# Patient Record
Sex: Male | Born: 1975 | Race: Black or African American | Hispanic: No | Marital: Single | Smoking: Current every day smoker
Health system: Southern US, Community
[De-identification: ages and names within clinical notes are randomized; demographics above are authoritative.]

## PROBLEM LIST (undated history)

## (undated) DIAGNOSIS — J45909 Unspecified asthma, uncomplicated: Secondary | ICD-10-CM

## (undated) DIAGNOSIS — K5090 Crohn's disease, unspecified, without complications: Secondary | ICD-10-CM

## (undated) DIAGNOSIS — T8143XA Infection following a procedure, organ and space surgical site, initial encounter: Secondary | ICD-10-CM

## (undated) DIAGNOSIS — K56609 Unspecified intestinal obstruction, unspecified as to partial versus complete obstruction: Secondary | ICD-10-CM

## (undated) DIAGNOSIS — K50012 Crohn's disease of small intestine with intestinal obstruction: Secondary | ICD-10-CM

## (undated) HISTORY — PX: MASS EXCISION: SHX2000

## (undated) HISTORY — DX: Crohn's disease of small intestine with intestinal obstruction: K50.012

## (undated) HISTORY — DX: Infection following a procedure, organ and space surgical site, initial encounter: T81.43XA

---

## 2018-05-03 ENCOUNTER — Encounter (HOSPITAL_COMMUNITY): Payer: Self-pay | Admitting: Emergency Medicine

## 2018-05-03 ENCOUNTER — Emergency Department (HOSPITAL_COMMUNITY): Payer: Self-pay

## 2018-05-03 ENCOUNTER — Inpatient Hospital Stay (HOSPITAL_COMMUNITY)
Admission: EM | Admit: 2018-05-03 | Discharge: 2018-05-06 | DRG: 387 | Disposition: A | Discharge status: Home / Self Care | Payer: Self-pay | Attending: Internal Medicine | Admitting: Internal Medicine

## 2018-05-03 ENCOUNTER — Other Ambulatory Visit: Payer: Self-pay

## 2018-05-03 DIAGNOSIS — R700 Elevated erythrocyte sedimentation rate: Secondary | ICD-10-CM | POA: Diagnosis present

## 2018-05-03 DIAGNOSIS — Z7951 Long term (current) use of inhaled steroids: Secondary | ICD-10-CM

## 2018-05-03 DIAGNOSIS — R112 Nausea with vomiting, unspecified: Secondary | ICD-10-CM

## 2018-05-03 DIAGNOSIS — Z09 Encounter for follow-up examination after completed treatment for conditions other than malignant neoplasm: Secondary | ICD-10-CM

## 2018-05-03 DIAGNOSIS — K50012 Crohn's disease of small intestine with intestinal obstruction: Principal | ICD-10-CM | POA: Diagnosis present

## 2018-05-03 DIAGNOSIS — F17210 Nicotine dependence, cigarettes, uncomplicated: Secondary | ICD-10-CM | POA: Diagnosis present

## 2018-05-03 DIAGNOSIS — R7982 Elevated C-reactive protein (CRP): Secondary | ICD-10-CM | POA: Diagnosis present

## 2018-05-03 DIAGNOSIS — J45909 Unspecified asthma, uncomplicated: Secondary | ICD-10-CM | POA: Diagnosis present

## 2018-05-03 DIAGNOSIS — K56609 Unspecified intestinal obstruction, unspecified as to partial versus complete obstruction: Secondary | ICD-10-CM | POA: Diagnosis present

## 2018-05-03 HISTORY — DX: Unspecified intestinal obstruction, unspecified as to partial versus complete obstruction: K56.609

## 2018-05-03 HISTORY — DX: Unspecified asthma, uncomplicated: J45.909

## 2018-05-03 LAB — URINALYSIS, ROUTINE W REFLEX MICROSCOPIC
Bilirubin Urine: NEGATIVE
GLUCOSE, UA: NEGATIVE mg/dL
HGB URINE DIPSTICK: NEGATIVE
KETONES UR: NEGATIVE mg/dL
LEUKOCYTES UA: NEGATIVE
Nitrite: NEGATIVE
PROTEIN: NEGATIVE mg/dL
Specific Gravity, Urine: 1.043 — ABNORMAL HIGH (ref 1.005–1.030)
pH: 5 (ref 5.0–8.0)

## 2018-05-03 LAB — CBC WITH DIFFERENTIAL/PLATELET
ABS IMMATURE GRANULOCYTES: 0 10*3/uL (ref 0.0–0.1)
BASOS ABS: 0.1 10*3/uL (ref 0.0–0.1)
BASOS PCT: 1 %
Eosinophils Absolute: 0.3 10*3/uL (ref 0.0–0.7)
Eosinophils Relative: 3 %
HCT: 44.6 % (ref 39.0–52.0)
Hemoglobin: 14.1 g/dL (ref 13.0–17.0)
IMMATURE GRANULOCYTES: 0 %
Lymphocytes Relative: 8 %
Lymphs Abs: 0.8 10*3/uL (ref 0.7–4.0)
MCH: 28.5 pg (ref 26.0–34.0)
MCHC: 31.6 g/dL (ref 30.0–36.0)
MCV: 90.1 fL (ref 78.0–100.0)
Monocytes Absolute: 1.3 10*3/uL — ABNORMAL HIGH (ref 0.1–1.0)
Monocytes Relative: 14 %
NEUTROS ABS: 7.1 10*3/uL (ref 1.7–7.7)
NEUTROS PCT: 74 %
PLATELETS: 257 10*3/uL (ref 150–400)
RBC: 4.95 MIL/uL (ref 4.22–5.81)
RDW: 16 % — ABNORMAL HIGH (ref 11.5–15.5)
WBC: 9.6 10*3/uL (ref 4.0–10.5)

## 2018-05-03 LAB — COMPREHENSIVE METABOLIC PANEL
ALBUMIN: 3.1 g/dL — AB (ref 3.5–5.0)
ALT: 13 U/L (ref 0–44)
AST: 14 U/L — AB (ref 15–41)
Alkaline Phosphatase: 51 U/L (ref 38–126)
Anion gap: 8 (ref 5–15)
BUN: 8 mg/dL (ref 6–20)
CHLORIDE: 103 mmol/L (ref 98–111)
CO2: 27 mmol/L (ref 22–32)
CREATININE: 1 mg/dL (ref 0.61–1.24)
Calcium: 9.3 mg/dL (ref 8.9–10.3)
GFR calc Af Amer: >60 mL/min (ref >60)
GFR calc non Af Amer: >60 mL/min (ref >60)
Glucose, Bld: 102 mg/dL — ABNORMAL HIGH (ref 70–99)
POTASSIUM: 4.2 mmol/L (ref 3.5–5.1)
SODIUM: 138 mmol/L (ref 135–145)
Total Bilirubin: 0.7 mg/dL (ref 0.3–1.2)
Total Protein: 6.7 g/dL (ref 6.5–8.1)

## 2018-05-03 LAB — HEMOGLOBIN A1C
Hgb A1c MFr Bld: 6 % — ABNORMAL HIGH (ref 4.8–5.6)
MEAN PLASMA GLUCOSE: 125.5 mg/dL

## 2018-05-03 LAB — SEDIMENTATION RATE: SED RATE: 46 mm/h — AB (ref 0–16)

## 2018-05-03 LAB — LIPASE, BLOOD: Lipase: 32 U/L (ref 11–51)

## 2018-05-03 LAB — MAGNESIUM: Magnesium: 1.9 mg/dL (ref 1.7–2.4)

## 2018-05-03 LAB — TSH: TSH: 0.3 u[IU]/mL — ABNORMAL LOW (ref 0.350–4.500)

## 2018-05-03 MED ORDER — ONDANSETRON HCL 4 MG/2ML IJ SOLN
4.0000 mg | Freq: Once | INTRAMUSCULAR | Status: AC
  Administered 2018-05-03: 4 mg via INTRAVENOUS
  Filled 2018-05-03: qty 2

## 2018-05-03 MED ORDER — FENTANYL CITRATE (PF) 100 MCG/2ML IJ SOLN
25.0000 ug | INTRAMUSCULAR | Status: DC | PRN
  Administered 2018-05-03 – 2018-05-04 (×4): 25 ug via INTRAVENOUS
  Filled 2018-05-03 (×4): qty 2

## 2018-05-03 MED ORDER — IOPAMIDOL (ISOVUE-300) INJECTION 61%
100.0000 mL | Freq: Once | INTRAVENOUS | Status: AC | PRN
  Administered 2018-05-03: 100 mL via INTRAVENOUS

## 2018-05-03 MED ORDER — ENOXAPARIN SODIUM 40 MG/0.4ML ~~LOC~~ SOLN
40.0000 mg | Freq: Every day | SUBCUTANEOUS | Status: DC
  Administered 2018-05-03: 40 mg via SUBCUTANEOUS
  Filled 2018-05-03: qty 0.4

## 2018-05-03 MED ORDER — LEVALBUTEROL HCL 1.25 MG/0.5ML IN NEBU: 1.2500 mg | INHALATION_SOLUTION | Freq: Four times a day (QID) | RESPIRATORY_TRACT | Status: DC | PRN

## 2018-05-03 MED ORDER — ONDANSETRON HCL 4 MG/2ML IJ SOLN: 4.0000 mg | Freq: Four times a day (QID) | INTRAMUSCULAR | Status: DC | PRN

## 2018-05-03 MED ORDER — ALBUTEROL SULFATE (2.5 MG/3ML) 0.083% IN NEBU
3.0000 mL | INHALATION_SOLUTION | Freq: Four times a day (QID) | RESPIRATORY_TRACT | Status: DC | PRN
  Administered 2018-05-04: 3 mL via RESPIRATORY_TRACT
  Filled 2018-05-03: qty 3

## 2018-05-03 MED ORDER — HEPARIN SODIUM (PORCINE) 5000 UNIT/ML IJ SOLN
5000.0000 [IU] | Freq: Three times a day (TID) | INTRAMUSCULAR | Status: DC
  Administered 2018-05-04 – 2018-05-05 (×3): 5000 [IU] via SUBCUTANEOUS
  Filled 2018-05-03 (×5): qty 1

## 2018-05-03 MED ORDER — ACETAMINOPHEN 325 MG PO TABS: 650.0000 mg | ORAL_TABLET | Freq: Four times a day (QID) | ORAL | Status: DC | PRN

## 2018-05-03 MED ORDER — METHYLPREDNISOLONE SODIUM SUCC 40 MG IJ SOLR
40.0000 mg | Freq: Every day | INTRAMUSCULAR | Status: DC
  Administered 2018-05-03 – 2018-05-06 (×4): 40 mg via INTRAVENOUS
  Filled 2018-05-03 (×4): qty 1

## 2018-05-03 MED ORDER — MORPHINE SULFATE (PF) 4 MG/ML IV SOLN
4.0000 mg | Freq: Once | INTRAVENOUS | Status: AC
  Administered 2018-05-03: 4 mg via INTRAVENOUS
  Filled 2018-05-03: qty 1

## 2018-05-03 MED ORDER — METRONIDAZOLE IN NACL 5-0.79 MG/ML-% IV SOLN
500.0000 mg | Freq: Three times a day (TID) | INTRAVENOUS | Status: DC
  Administered 2018-05-03 – 2018-05-06 (×11): 500 mg via INTRAVENOUS
  Filled 2018-05-03 (×12): qty 100

## 2018-05-03 MED ORDER — SODIUM CHLORIDE 0.9 % IV SOLN
Freq: Once | INTRAVENOUS | Status: AC
  Administered 2018-05-03: 07:00:00 via INTRAVENOUS

## 2018-05-03 MED ORDER — SODIUM CHLORIDE 0.9 % IV SOLN
INTRAVENOUS | Status: DC
  Administered 2018-05-04 – 2018-05-05 (×3): via INTRAVENOUS

## 2018-05-03 MED ORDER — ONDANSETRON HCL 4 MG PO TABS: 4.0000 mg | ORAL_TABLET | Freq: Four times a day (QID) | ORAL | Status: DC | PRN

## 2018-05-03 MED ORDER — POTASSIUM CHLORIDE IN NACL 20-0.9 MEQ/L-% IV SOLN: INTRAVENOUS | Status: DC

## 2018-05-03 MED ORDER — CIPROFLOXACIN IN D5W 400 MG/200ML IV SOLN
400.0000 mg | Freq: Two times a day (BID) | INTRAVENOUS | Status: DC
  Administered 2018-05-03 – 2018-05-06 (×7): 400 mg via INTRAVENOUS
  Filled 2018-05-03 (×8): qty 200

## 2018-05-03 MED ORDER — IOPAMIDOL (ISOVUE-300) INJECTION 61%
INTRAVENOUS | Status: AC
  Filled 2018-05-03: qty 100

## 2018-05-03 MED ORDER — ACETAMINOPHEN 650 MG RE SUPP: 650.0000 mg | Freq: Four times a day (QID) | RECTAL | Status: DC | PRN

## 2018-05-03 MED ORDER — SODIUM CHLORIDE 0.9 % IV SOLN: INTRAVENOUS | Status: DC

## 2018-05-03 NOTE — ED Triage Notes (Signed)
Patient arrived with EMS from home reports upper adominal pain with constipation for 3 days , emesis x2 , denies fever or diarrhea , admitted 2 1/2 weeks ago at [facility] for SBO .

## 2018-05-03 NOTE — Consult Note (Addendum)
[facility] Consult  Referring Provider: Reyne Dumas, MD Primary Care Physician:  Patient, No Pcp Per Primary Gastroenterologist: Althia Forts  Reason for Consultation:  Small bowel obstruction, possible Crohn's disease  HPI: Eric Castillo is a 42 y.o. male presented to the ER with complaints of abdominal pain. 3 weeks ago when he was in [place], he developed nausea along with upper abdominal pain and was admitted for a week with small bowel obstruction. Patient was being planned for a colonoscopy but was not able to tolerate the prep. He was subsequently discharged on steroids along with ciprofloxacin and Flagyl. He did okay for about a week, however since the last 5 days has recurrence of symptoms, which he describes as upper abdominal pain, bloating, belching. Patient has not had a bowel movement for the last 4-5 days, denies passage of flatus. His mother had Crohn's disease, and passed away 3 years ago, needed surgical intervention as well as long-term management for Crohn's disease. His father had colon cancer in his 28s. The patient has never had a colonoscopy in the past. Denies rectal bleeding, black stools, loss of appetite or unintentional weight loss recently. He is a smoker and smokes about 4 cigarettes a day since the age of 40. He denies oral ulcers, joint pain or skin rashes. Patient denies acid reflux, heartburn, difficulty swallowing or pain on swallowing.   Past Medical History:  Diagnosis Date  . Asthma   . SBO (small bowel obstruction) ([place])     History reviewed. No pertinent surgical history.  Prior to Admission medications   Medication Sig Start Date End Date Taking? Authorizing Provider  albuterol (PROVENTIL HFA;VENTOLIN HFA) 108 (90 Base) MCG/ACT inhaler Inhale 1-2 puffs into the lungs every 6 (six) hours as needed for wheezing or shortness of breath.   Yes [provider]    Current Facility-Administered Medications  Medication  Dose Route Frequency Provider Last Rate Last Dose  . ciprofloxacin (CIPRO) IVPB 400 mg  400 mg Intravenous Q12H Reyne Dumas, MD 200 mL/hr at 05/03/18 1041 400 mg at 05/03/18 1041  . enoxaparin (LOVENOX) injection 40 mg  40 mg Subcutaneous Daily Reyne Dumas, MD   40 mg at 05/03/18 1331  . fentaNYL (SUBLIMAZE) injection 25-50 mcg  25-50 mcg Intravenous Q2H PRN Opyd, Ilene Qua, MD   25 mcg at 05/03/18 1431  . levalbuterol (XOPENEX) nebulizer solution 1.25 mg  1.25 mg Nebulization Q6H PRN Reyne Dumas, MD      . methylPREDNISolone sodium succinate (SOLU-MEDROL) 40 mg/mL injection 40 mg  40 mg Intravenous Daily Reyne Dumas, MD   40 mg at 05/03/18 0933  . metroNIDAZOLE (FLAGYL) IVPB 500 mg  500 mg Intravenous Q8H Abrol, Ascencion Dike, MD 100 mL/hr at 05/03/18 0935 500 mg at 05/03/18 0935  . ondansetron (ZOFRAN) injection 4 mg  4 mg Intravenous Q6H PRN Opyd, Ilene Qua, MD        Allergies as of 05/03/2018  . (No Known Allergies)    History reviewed. No pertinent family history.  Social History   Socioeconomic History  . Marital status: Single    Spouse name: Not on file  . Number of children: Not on file  . Years of education: Not on file  . Highest education level: Not on file  Occupational History  . Not on file  Social Needs  . Financial resource strain: Not on file  . Food insecurity:    Worry: Not on file    Inability: Not on file  . Transportation needs:  Medical: Not on file    Non-medical: Not on file  Tobacco Use  . Smoking status: Current Every Day Smoker  . Smokeless tobacco: Never Used  Substance and Sexual Activity  . Alcohol use: Yes  . Drug use: Yes    Types: Marijuana  . Sexual activity: Not on file  Lifestyle  . Physical activity:    Days per week: Not on file    Minutes per session: Not on file  . Stress: Not on file  Relationships  . Social connections:    Talks on phone: Not on file    Gets together: Not on file    Attends religious service: Not on  file    Active member of club or organization: Not on file    Attends meetings of clubs or organizations: Not on file    Relationship status: Not on file  . Intimate partner violence:    Fear of current or ex partner: Not on file    Emotionally abused: Not on file    Physically abused: Not on file    Forced sexual activity: Not on file  Other Topics Concern  . Not on file  Social History Narrative  . Not on file    Review of Systems: Positive for: GI: Described in detail in HPI.    Gen: Denies any fever, chills, rigors, night sweats, anorexia, fatigue, weakness, malaise, involuntary weight loss, and sleep disorder CV: Denies chest pain, angina, palpitations, syncope, orthopnea, PND, peripheral edema, and claudication. Resp: Denies dyspnea, cough, sputum, wheezing, coughing up blood. GU : Denies urinary burning, blood in urine, urinary frequency, urinary hesitancy, nocturnal urination, and urinary incontinence. MS: Denies joint pain or swelling.  Denies muscle weakness, cramps, atrophy.  Derm: Denies rash, itching, oral ulcerations, hives, unhealing ulcers.  Psych: Denies depression, anxiety, memory loss, suicidal ideation, hallucinations,  and confusion. Heme: Denies bruising, bleeding, and enlarged lymph nodes. Neuro:  Denies any headaches, dizziness, paresthesias. Endo:  Denies any problems with DM, thyroid, adrenal function.  Physical Exam: Vital signs in last 24 hours: Temp:  [97.6 F (36.4 C)-98.5 F (36.9 C)] 98.5 F (36.9 C) (09/03 1356) Pulse Rate:  [71-89] 79 (09/03 1356) Resp:  [10-19] 16 (09/03 1356) BP: (103-153)/(78-100) 141/83 (09/03 1356) SpO2:  [97 %-100 %] 99 % (09/03 1356) Weight:  [62.4 kg] 62.4 kg (09/03 1330) Last BM Date: 04/29/18  General:   Alert,  Well-developed, thinly built, pleasant and cooperative in NAD NG tube connected to suction Head:  Normocephalic and atraumatic. Eyes:  Sclera clear, no icterus.   Conjunctiva pink. Ears:  Normal auditory  acuity. Nose:  No deformity, discharge,  or lesions. Mouth:  No deformity or lesions.  Oropharynx pink & moist. Neck:  Supple; no masses or thyromegaly. Lungs:  Clear throughout to auscultation.   No wheezes, crackles, or rhonchi. No acute distress. Heart:  Regular rate and rhythm; no murmurs, clicks, rubs,  or gallops. Extremities:  Without clubbing or edema. Neurologic:  Alert and  oriented x4;  grossly normal neurologically. Skin:  Intact without significant lesions or rashes. Psych:  Alert and cooperative. Normal mood and affect. Abdomen:  Soft,mild right lower quadrant tenderness and nondistended. No masses, hepatosplenomegaly or hernias noted. Hypoactive bowel sounds, without guarding, and without rebound.         Lab Results: Recent Labs    05/03/18 0425  WBC 9.6  HGB 14.1  HCT 44.6  PLT 257   BMET Recent Labs    05/03/18 0425  NA  138  K 4.2  CL 103  CO2 27  GLUCOSE 102*  BUN 8  CREATININE 1.00  CALCIUM 9.3   LFT Recent Labs    05/03/18 0425  PROT 6.7  ALBUMIN 3.1*  AST 14*  ALT 13  ALKPHOS 51  BILITOT 0.7   PT/INR No results for input(s): LABPROT, INR in the last 72 hours.  Studies/Results: Ct Abdomen Pelvis W Contrast  Result Date: 05/03/2018 CLINICAL DATA:  Upper abdominal pain and constipation for 3 days. Vomiting. Recent hospital admission for small-bowel obstruction. EXAM: CT ABDOMEN AND PELVIS WITH CONTRAST TECHNIQUE: Multidetector CT imaging of the abdomen and pelvis was performed using the standard protocol following bolus administration of intravenous contrast. CONTRAST:  100 mL ISOVUE-300 IOPAMIDOL (ISOVUE-300) INJECTION 61% COMPARISON:  None. FINDINGS: LOWER CHEST: Mild bronchial wall thickening and tiny tree-in-bud infiltrates most compatible with respiratory bronchiolitis. Lung bases are clear. Included heart size is normal. No pericardial effusion. HEPATOBILIARY: 19 mm homogeneously hypodense benign-appearing cyst RIGHT lobe of the liver.  Subcentimeter probable flash filling hemangioma segment 4 of the liver. PANCREAS: Normal. SPLEEN: Normal. ADRENALS/URINARY TRACT: Kidneys are orthotopic, demonstrating symmetric enhancement. Mild cortical thinning lower pole RIGHT kidney. No nephrolithiasis, hydronephrosis or solid renal masses. The unopacified ureters are normal in course and caliber. Delayed imaging through the kidneys demonstrates symmetric prompt contrast excretion within the proximal urinary collecting system. Urinary bladder is partially distended and unremarkable. Normal adrenal glands. STOMACH/BOWEL: Small bowel dilated to 4.2 cm with air-fluid levels, transition point at terminal ileum associated with bowel wall thickening and inflammatory changes. Decompressed colon. The appendix is not discretely identified. VASCULAR/LYMPHATIC: Aortoiliac vessels are normal in course and caliber. No lymphadenopathy by CT size criteria. REPRODUCTIVE: Normal. OTHER: Small amount of free fluid in the pelvis. No intraperitoneal free air or focal fluid collections. MUSCULOSKELETAL: Nonacute. IMPRESSION: 1. Small-bowel obstruction with transition point at inflamed terminal ileum concerning for inflammatory bowel disease. 2. Mild suspected respiratory bronchiolitis with bronchitic changes. Electronically Signed   By: Elon Alas M.D.   On: 05/03/2018 06:26    Impression: 1.Small bowel obstruction with transition point at the inflamed terminal ileum concerning for inflammatory bowel disease,possible Crohn's Small bowel dilated to 4.2 cm with air fluid levels and decompressed colon Hemoglobin 14.1, platelets 257,total protein 6.7, albumin low at 3.1  2.19 mm right lower fatty liver cyst, subcentimeter hemangioma in segment 4 of liver  3. Mild respiratory bronchiolitis with bronchitis changes  Plan: Agree with NG tube placement and connection to low intermittent suction Patient has already been started on IV Solu-Medrol 40 mg daily Empirical  treatment with ciprofloxacin 400 mg IV every 12 hours and Flagyl 500 mg every 8 hours Normal saline at 100 mL per hour  Will send labs for ESR, CRP,ASCA and ANCA antibodies. Will send labs for HBsAG and TB gold mitogen testing, in anticipation of starting biologics/immunosuppresants in the future. Colonoscopy with terminal ileal biopsies at one point, fully in the next few weeks after resolution of small bowel obstruction, likely as an outpatient. Discussed about possible surgical intervention if small bowel obstruction does not resolve with conservative management. Discussed about the importance of smoking cessation( especially if he has Crohn's disease)   LOS: 0 days   Ronnette Juniper, MD  05/03/2018, 2:38 PM  Pager [phone] If no answer or after 5 PM call [phone]

## 2018-05-03 NOTE — H&P (Addendum)
[facility] History and Physical  Eric Castillo XBL:[id number] DOB: [birth year] DOA: 05/03/2018  Referring physician:   PCP: Eric Castillo, No Pcp Per   Chief Complaint:  constipation  HPI:   42 year old male with a previous history of small bowel obstruction presents to the ER with abdominal pain nausea vomiting, constipation 3 days. Eric Castillo was admitted  1.5 weeks ago at [facility] , [place] , for small bowel obstruction.Eric Castillo was discharged with a diagnosis of questionable Crohn's and was discharged on Cipro and Flagyl. Eric Castillo reports 2-3 days of worsening upper abdominal pain, 8 on 10 in intensity. No bowel movement since Friday.no flatus. Multiple episodes of nonbilious nonbloody emesis.  Symptoms similar to episode of week and a half ago. He had a CT abdomen pelvis but no endoscopic evaluation. Crohn's disease was suspected. Eric Castillo  Lives in [place] and was asked to follow-up with gastroenterology [facility] ED course BP 138/88 (BP Location: Right Arm)   Pulse 89   Temp 97.6 F (36.4 C) (Oral)   Resp 16   SpO2 97%  Creatinine 1.0, potassium 4.2, white count 9.6, CT abdomen pelvis shows small bowel obstruction with transition point concerning for inflammatory bowel disease Eric Castillo also suspected to have respiratory bronchiolitis  Eric Castillo admitted for suspected Crohn's disease exacerbation   Review of Systems: negative for the following  Constitutional: Denies fever, chills, diaphoresis, appetite change and fatigue.  HEENT: Denies photophobia, eye pain, redness, hearing loss, ear pain, congestion, sore throat, rhinorrhea, sneezing, mouth sores, trouble swallowing, neck pain, neck stiffness and tinnitus.  Respiratory: Denies SOB, DOE, cough, chest tightness, and wheezing.  Cardiovascular: Denies chest pain, palpitations and leg swelling.  Gastrointestinal: Positive for abdominal pain, constipation, nausea and vomiting.   Genitourinary: Denies dysuria, urgency, frequency, hematuria,  flank pain and difficulty urinating.  Musculoskeletal: Denies myalgias, back pain, joint swelling, arthralgias and gait problem.  Skin: Denies pallor, rash and wound.  Neurological: Denies dizziness, seizures, syncope, weakness, light-headedness, numbness and headaches.  Hematological: Denies adenopathy. Easy bruising, personal or family bleeding history  Psychiatric/Behavioral: Denies suicidal ideation, mood changes, confusion, nervousness, sleep disturbance and agitation       Past Medical History:  Diagnosis Date  . Asthma   . SBO (small bowel obstruction) ([place])      History reviewed. No pertinent surgical history.    Social History:  reports that he has been smoking. He has never used smokeless tobacco. He reports that he drinks alcohol. He reports that he has current or past drug history. Drug: Marijuana.    No Known Allergies      FAMILY HISTORY  When questioned  Directly-Eric Castillo reports  No family history of HTN, CVA ,DIABETES, TB, Cancer CAD, Bleeding Disorders, Sickle Cell, diabetes, anemia, asthma,   Prior to Admission medications   Medication Sig Start Date End Date Taking? Authorizing Provider  albuterol (PROVENTIL HFA;VENTOLIN HFA) 108 (90 Base) MCG/ACT inhaler Inhale 1-2 puffs into the lungs every 6 (six) hours as needed for wheezing or shortness of breath.   Yes [provider]     Physical Exam: Vitals:   05/03/18 0700 05/03/18 0715 05/03/18 0730 05/03/18 0806  BP: (!) 153/100 (!) 150/98 (!) 147/97 (!) 148/97  Pulse: 72 82 76 71  Resp: 13 14 18 16   Temp:    98.5 F (36.9 C)  TempSrc:    Oral  SpO2: 100% 100% 100% 98%        Vitals:   05/03/18 0700 05/03/18 0715 05/03/18 0730 05/03/18 0806  BP: (!) 153/100 (!) 150/98 Marland Kitchen)  147/97 (!) 148/97  Pulse: 72 82 76 71  Resp: 13 14 18 16   Temp:    98.5 F (36.9 C)  TempSrc:    Oral  SpO2: 100% 100% 100% 98%   Constitutional: NAD, calm, comfortable, NG tube in place Eyes: PERRL, lids and  conjunctivae normal ENMT: Mucous membranes are moist. Posterior pharynx clear of any exudate or lesions.Normal dentition.  Neck: normal, supple, no masses, no thyromegaly Respiratory: clear to auscultation bilaterally, no wheezing, no crackles. Normal respiratory effort. No accessory muscle use.  Cardiovascular: Regular rate and rhythm, no murmurs / rubs / gallops. No extremity edema. 2+ pedal pulses. No carotid bruits.  Abdomen:diffuse tenderness,hypoactive bowel sounds, no masses palpated. No hepatosplenomegaly. Bowel sounds absent Musculoskeletal: no clubbing / cyanosis. No joint deformity upper and lower extremities. Good ROM, no contractures. Normal muscle tone.  Skin: no rashes, lesions, ulcers. No induration Neurologic: CN 2-12 grossly intact. Sensation intact, DTR normal. Strength 5/5 in all 4.  Psychiatric: Normal judgment and insight. Alert and oriented x 3. Normal mood.     Labs on Admission: I have personally reviewed following labs and imaging studies  CBC: Recent Labs  Lab 05/03/18 0425  WBC 9.6  NEUTROABS 7.1  HGB 14.1  HCT 44.6  MCV 90.1  PLT 353    Basic Metabolic Panel: Recent Labs  Lab 05/03/18 0425  NA 138  K 4.2  CL 103  CO2 27  GLUCOSE 102*  BUN 8  CREATININE 1.00  CALCIUM 9.3    GFR: CrCl cannot be calculated (Unknown ideal weight.).  Liver Function Tests: Recent Labs  Lab 05/03/18 0425  AST 14*  ALT 13  ALKPHOS 51  BILITOT 0.7  PROT 6.7  ALBUMIN 3.1*   Recent Labs  Lab 05/03/18 0425  LIPASE 32   No results for input(s): AMMONIA in the last 168 hours.  Coagulation Profile: No results for input(s): INR, PROTIME in the last 168 hours. No results for input(s): DDIMER in the last 72 hours.  Cardiac Enzymes: No results for input(s): CKTOTAL, CKMB, CKMBINDEX, TROPONINI in the last 168 hours.  BNP (last 3 results) No results for input(s): PROBNP in the last 8760 hours.  HbA1C: No results for input(s): HGBA1C in the last 72  hours. No results found for: HGBA1C   CBG: No results for input(s): GLUCAP in the last 168 hours.  Lipid Profile: No results for input(s): CHOL, HDL, LDLCALC, TRIG, CHOLHDL, LDLDIRECT in the last 72 hours.  Thyroid Function Tests: No results for input(s): TSH, T4TOTAL, FREET4, T3FREE, THYROIDAB in the last 72 hours.  Anemia Panel: No results for input(s): VITAMINB12, FOLATE, FERRITIN, TIBC, IRON, RETICCTPCT in the last 72 hours.  Urine analysis:    Component Value Date/Time   COLORURINE YELLOW 05/03/2018 0658   APPEARANCEUR CLEAR 05/03/2018 0658   LABSPEC 1.043 (H) 05/03/2018 0658   PHURINE 5.0 05/03/2018 0658   GLUCOSEU NEGATIVE 05/03/2018 0658   HGBUR NEGATIVE 05/03/2018 0658   BILIRUBINUR NEGATIVE 05/03/2018 0658   KETONESUR NEGATIVE 05/03/2018 0658   PROTEINUR NEGATIVE 05/03/2018 0658   NITRITE NEGATIVE 05/03/2018 0658   LEUKOCYTESUR NEGATIVE 05/03/2018 0658    Sepsis Labs: @LABRCNTIP (procalcitonin:4,lacticidven:4) )No results found for this or any previous visit (from the past 240 hour(s)).       Radiological Exams on Admission: Ct Abdomen Pelvis W Contrast  Result Date: 05/03/2018 CLINICAL DATA:  Upper abdominal pain and constipation for 3 days. Vomiting. Recent hospital admission for small-bowel obstruction. EXAM: CT ABDOMEN AND PELVIS WITH CONTRAST TECHNIQUE:  Multidetector CT imaging of the abdomen and pelvis was performed using the standard protocol following bolus administration of intravenous contrast. CONTRAST:  100 mL ISOVUE-300 IOPAMIDOL (ISOVUE-300) INJECTION 61% COMPARISON:  None. FINDINGS: LOWER CHEST: Mild bronchial wall thickening and tiny tree-in-bud infiltrates most compatible with respiratory bronchiolitis. Lung bases are clear. Included heart size is normal. No pericardial effusion. HEPATOBILIARY: 19 mm homogeneously hypodense benign-appearing cyst RIGHT lobe of the liver. Subcentimeter probable flash filling hemangioma segment 4 of the liver.  PANCREAS: Normal. SPLEEN: Normal. ADRENALS/URINARY TRACT: Kidneys are orthotopic, demonstrating symmetric enhancement. Mild cortical thinning lower pole RIGHT kidney. No nephrolithiasis, hydronephrosis or solid renal masses. The unopacified ureters are normal in course and caliber. Delayed imaging through the kidneys demonstrates symmetric prompt contrast excretion within the proximal urinary collecting system. Urinary bladder is partially distended and unremarkable. Normal adrenal glands. STOMACH/BOWEL: Small bowel dilated to 4.2 cm with air-fluid levels, transition point at terminal ileum associated with bowel wall thickening and inflammatory changes. Decompressed colon. The appendix is not discretely identified. VASCULAR/LYMPHATIC: Aortoiliac vessels are normal in course and caliber. No lymphadenopathy by CT size criteria. REPRODUCTIVE: Normal. OTHER: Small amount of free fluid in the pelvis. No intraperitoneal free air or focal fluid collections. MUSCULOSKELETAL: Nonacute. IMPRESSION: 1. Small-bowel obstruction with transition point at inflamed terminal ileum concerning for inflammatory bowel disease. 2. Mild suspected respiratory bronchiolitis with bronchitic changes. Electronically Signed   By: Elon Alas M.D.   On: 05/03/2018 06:26   Ct Abdomen Pelvis W Contrast  Result Date: 05/03/2018 CLINICAL DATA:  Upper abdominal pain and constipation for 3 days. Vomiting. Recent hospital admission for small-bowel obstruction. EXAM: CT ABDOMEN AND PELVIS WITH CONTRAST TECHNIQUE: Multidetector CT imaging of the abdomen and pelvis was performed using the standard protocol following bolus administration of intravenous contrast. CONTRAST:  100 mL ISOVUE-300 IOPAMIDOL (ISOVUE-300) INJECTION 61% COMPARISON:  None. FINDINGS: LOWER CHEST: Mild bronchial wall thickening and tiny tree-in-bud infiltrates most compatible with respiratory bronchiolitis. Lung bases are clear. Included heart size is normal. No pericardial  effusion. HEPATOBILIARY: 19 mm homogeneously hypodense benign-appearing cyst RIGHT lobe of the liver. Subcentimeter probable flash filling hemangioma segment 4 of the liver. PANCREAS: Normal. SPLEEN: Normal. ADRENALS/URINARY TRACT: Kidneys are orthotopic, demonstrating symmetric enhancement. Mild cortical thinning lower pole RIGHT kidney. No nephrolithiasis, hydronephrosis or solid renal masses. The unopacified ureters are normal in course and caliber. Delayed imaging through the kidneys demonstrates symmetric prompt contrast excretion within the proximal urinary collecting system. Urinary bladder is partially distended and unremarkable. Normal adrenal glands. STOMACH/BOWEL: Small bowel dilated to 4.2 cm with air-fluid levels, transition point at terminal ileum associated with bowel wall thickening and inflammatory changes. Decompressed colon. The appendix is not discretely identified. VASCULAR/LYMPHATIC: Aortoiliac vessels are normal in course and caliber. No lymphadenopathy by CT size criteria. REPRODUCTIVE: Normal. OTHER: Small amount of free fluid in the pelvis. No intraperitoneal free air or focal fluid collections. MUSCULOSKELETAL: Nonacute. IMPRESSION: 1. Small-bowel obstruction with transition point at inflamed terminal ileum concerning for inflammatory bowel disease. 2. Mild suspected respiratory bronchiolitis with bronchitic changes. Electronically Signed   By: Elon Alas M.D.   On: 05/03/2018 06:26      EKG: Independently reviewed.   Assessment/Plan Principal Problem:   SBO (small bowel obstruction) (HCC) Suspect due to crohn's stricture , no prior hx of abdominal surgeries Started on iv solumedrol , iv cipro, flagyl,IVF [facility] consulted , Dr Therisa Doyne NGT tube in place , to suction    Asthma stable , without exacerbation stable from a pulmonary  standpoint Changes on x-ray appeared to be chronic, Eric Castillo is asymptomatic      DVT prophylaxis:  lovenox      consults  called:gastroenterology  Family Communication: Admission, patients condition and plan of care including tests being ordered have been discussed with the Eric Castillo  who indicates understanding and agree with the plan and Code Status   Admission status:  inpatient The appropriate Eric Castillo status for this Eric Castillo is INPATIENT. Inpatient status is judged to be reasonable and necessary in order to provide the required intensity of service to ensure the Eric Castillo's safety. The Eric Castillo's presenting symptoms, physical exam findings, and initial radiographic and laboratory data in the context of their chronic comorbidities is felt to place them at high risk for further clinical deterioration. Furthermore, it is not anticipated that the Eric Castillo will be medically stable for discharge from the hospital within 2 midnights of admission. The following factors support the Eric Castillo status of inpatient.    "           The Eric Castillo's presenting symptoms include small bowel obstruction     * I certify that at the point of admission it is my clinical judgment that the Eric Castillo will require inpatient hospital care spanning beyond 2 midnights from the point of admission due to high intensity of service, high risk for further deterioration and high frequency of surveillance required.*    Disposition plan: Further plan will depend as Eric Castillo's clinical course evolves and further radiologic and laboratory data become available. Likely home when stable    At the time of admission, it appears that the appropriate admission status for this Eric Castillo is INPATIENT . This is judged to be reasonable and necessary in order to provide the required intensity of service to ensure the Eric Castillo's safety given the presenting symptoms, physical exam findings, and initial radiographic and laboratory data in the context of their chronic comorbidities.   Reyne Dumas MD [facility] Pager [phone]  If 7PM-7AM, please contact  night-coverage [email or web address] Password [email or web address]  05/03/2018, 8:17 AM

## 2018-05-03 NOTE — ED Provider Notes (Signed)
[facility] EMERGENCY DEPARTMENT Provider Note   CSN: [id number] Arrival date & time: 05/03/18  0358     History   Chief Complaint Chief Complaint  Patient presents with  . Abdominal Pain    Hx: SBO   . Constipation    HPI Eric Castillo is a 42 y.o. male.  HPI  This is a 42 year old male who presents with abdominal pain, constipation, vomiting.  Patient reports recent history of small bowel obstruction for which she was seen in [place].  Has his discharge paperwork.  He has not been able to follow-up with gastroenterology.  He reports 2 to 3-day history of worsening upper sharp abdominal pain that does not radiate.  It feels like his prior obstruction.  Currently his pain is 8 out of 10.  He did take castor oil with no relief.  He has not had a bowel movement since Friday.  He reports multiple episodes of nonbilious, nonbloody emesis.  Denies fevers.  Eyes chest pain, shortness of breath, urinary symptoms.  She does report alcohol use.  I have reviewed his discharge paperwork.  Some indication of inflammatory bowel disease and questionable Crohn's diagnosis.  Recommend follow-up with gastroenterology.  He was discharged home on Cipro and Flagyl and reports that he completed his course.  Past Medical History:  Diagnosis Date  . Asthma   . SBO (small bowel obstruction) [facility])     Patient Active Problem List   Diagnosis Date Noted  . SBO (small bowel obstruction) ([place]) 05/03/2018  . Asthma 05/03/2018    History reviewed. No pertinent surgical history.      Home Medications    Prior to Admission medications   Medication Sig Start Date End Date Taking? Authorizing Provider  albuterol (PROVENTIL HFA;VENTOLIN HFA) 108 (90 Base) MCG/ACT inhaler Inhale 1-2 puffs into the lungs every 6 (six) hours as needed for wheezing or shortness of breath.   Yes [provider]    Family History History reviewed. No pertinent family history.  Social  History Social History   Tobacco Use  . Smoking status: Current Every Day Smoker  . Smokeless tobacco: Never Used  Substance Use Topics  . Alcohol use: Yes  . Drug use: Yes    Types: Marijuana     Allergies   Patient has no known allergies.   Review of Systems Review of Systems  Constitutional: Negative for fever.  Respiratory: Negative for shortness of breath.   Cardiovascular: Negative for chest pain.  Gastrointestinal: Positive for abdominal pain, constipation, nausea and vomiting. Negative for diarrhea.  Genitourinary: Negative for dysuria.  All other systems reviewed and are negative.    Physical Exam Updated Vital Signs BP 138/88 (BP Location: Right Arm)   Pulse 89   Temp 97.6 F (36.4 C) (Oral)   Resp 16   SpO2 97%   Physical Exam  Constitutional: He is oriented to person, place, and time. He appears well-developed and well-nourished. He does not appear ill.  HENT:  Head: Normocephalic and atraumatic.  Eyes: Pupils are equal, round, and reactive to light.  Neck: Neck supple.  Cardiovascular: Normal rate, regular rhythm and normal heart sounds.  No murmur heard. Pulmonary/Chest: Effort normal and breath sounds normal. No respiratory distress. He has no wheezes.  Abdominal: Soft. Bowel sounds are increased. There is generalized tenderness. There is no rebound and no guarding.  Musculoskeletal: He exhibits no edema.  Lymphadenopathy:    He has no cervical adenopathy.  Neurological: He is alert and oriented  to person, place, and time.  Skin: Skin is warm and dry.  Psychiatric: He has a normal mood and affect.  Nursing note and vitals reviewed.    ED Treatments / Results  Labs (all labs ordered are listed, but only abnormal results are displayed) Labs Reviewed  CBC WITH DIFFERENTIAL/PLATELET - Abnormal; Notable for the following components:      Result Value   RDW 16.0 (*)    Monocytes Absolute 1.3 (*)    All other components within normal limits   COMPREHENSIVE METABOLIC PANEL - Abnormal; Notable for the following components:   Glucose, Bld 102 (*)    Albumin 3.1 (*)    AST 14 (*)    All other components within normal limits  LIPASE, BLOOD  URINALYSIS, ROUTINE W REFLEX MICROSCOPIC    EKG None  Radiology Ct Abdomen Pelvis W Contrast  Result Date: 05/03/2018 CLINICAL DATA:  Upper abdominal pain and constipation for 3 days. Vomiting. Recent hospital admission for small-bowel obstruction. EXAM: CT ABDOMEN AND PELVIS WITH CONTRAST TECHNIQUE: Multidetector CT imaging of the abdomen and pelvis was performed using the standard protocol following bolus administration of intravenous contrast. CONTRAST:  100 mL ISOVUE-300 IOPAMIDOL (ISOVUE-300) INJECTION 61% COMPARISON:  None. FINDINGS: LOWER CHEST: Mild bronchial wall thickening and tiny tree-in-bud infiltrates most compatible with respiratory bronchiolitis. Lung bases are clear. Included heart size is normal. No pericardial effusion. HEPATOBILIARY: 19 mm homogeneously hypodense benign-appearing cyst RIGHT lobe of the liver. Subcentimeter probable flash filling hemangioma segment 4 of the liver. PANCREAS: Normal. SPLEEN: Normal. ADRENALS/URINARY TRACT: Kidneys are orthotopic, demonstrating symmetric enhancement. Mild cortical thinning lower pole RIGHT kidney. No nephrolithiasis, hydronephrosis or solid renal masses. The unopacified ureters are normal in course and caliber. Delayed imaging through the kidneys demonstrates symmetric prompt contrast excretion within the proximal urinary collecting system. Urinary bladder is partially distended and unremarkable. Normal adrenal glands. STOMACH/BOWEL: Small bowel dilated to 4.2 cm with air-fluid levels, transition point at terminal ileum associated with bowel wall thickening and inflammatory changes. Decompressed colon. The appendix is not discretely identified. VASCULAR/LYMPHATIC: Aortoiliac vessels are normal in course and caliber. No lymphadenopathy by CT  size criteria. REPRODUCTIVE: Normal. OTHER: Small amount of free fluid in the pelvis. No intraperitoneal free air or focal fluid collections. MUSCULOSKELETAL: Nonacute. IMPRESSION: 1. Small-bowel obstruction with transition point at inflamed terminal ileum concerning for inflammatory bowel disease. 2. Mild suspected respiratory bronchiolitis with bronchitic changes. Electronically Signed   By: Elon Alas M.D.   On: 05/03/2018 06:26    Procedures Procedures (including critical care time)  Medications Ordered in ED Medications  ondansetron (ZOFRAN) injection 4 mg (has no administration in time range)  0.9 %  sodium chloride infusion (has no administration in time range)  0.9 %  sodium chloride infusion (has no administration in time range)  fentaNYL (SUBLIMAZE) injection 25-50 mcg (has no administration in time range)  ondansetron (ZOFRAN) injection 4 mg (has no administration in time range)  morphine 4 MG/ML injection 4 mg (4 mg Intravenous Given 05/03/18 0444)  ondansetron (ZOFRAN) injection 4 mg (4 mg Intravenous Given 05/03/18 0445)  iopamidol (ISOVUE-300) 61 % injection 100 mL (100 mLs Intravenous Contrast Given 05/03/18 0558)     Initial Impression / Assessment and Plan / ED Course  I have reviewed the triage vital signs and the nursing notes.  Pertinent labs & imaging results that were available during my care of the patient were reviewed by me and considered in my medical decision making (see chart for details).  Clinical Course as of May 03 658  Tue May 03, 2018  0659 Discussed with Dr. Myna Hidalgo.  NG tube has been placed and patient is on maintenance fluids.  Daytime hospitalist team to admit.  Would expect likely gastroenterology versus surgery consultation given CT scan.   [CH]    Clinical Course User Index [CH] Ramiz Turpin, Barbette Hair, MD    Patient presents with abdominal pain, constipation, emesis.  Reports similar symptoms with recent small bowel obstruction and concern for  inflammatory bowel disease.  He is overall nontoxic-appearing on exam and afebrile.  Vital signs are otherwise reassuring.  He is tender on exam without signs of peritonitis.  Patient was given fluids and nausea and pain medication.  Lab work-up is largely unremarkable.  No significant leukocytosis.  Repeat CT scan obtained.  This shows a persistent small bowel obstruction with a likely trans-position point in the terminal ileum suggestive of inflammatory bowel disease.  NG tube has been placed.  Maintenance fluids ordered.  No indication for emergent surgical consultation.  Will admit to the hospitalist for expectant management.  Patient may need GI evaluation.    Final Clinical Impressions(s) / ED Diagnoses   Final diagnoses:  SBO (small bowel obstruction) [facility])    ED Discharge Orders    None       Dina Rich, Barbette Hair, MD 05/03/18 [id number]

## 2018-05-04 ENCOUNTER — Inpatient Hospital Stay (HOSPITAL_COMMUNITY): Payer: Self-pay

## 2018-05-04 DIAGNOSIS — Z09 Encounter for follow-up examination after completed treatment for conditions other than malignant neoplasm: Secondary | ICD-10-CM

## 2018-05-04 LAB — COMPREHENSIVE METABOLIC PANEL
ALK PHOS: 44 U/L (ref 38–126)
ALT: 12 U/L (ref 0–44)
AST: 13 U/L — ABNORMAL LOW (ref 15–41)
Albumin: 2.6 g/dL — ABNORMAL LOW (ref 3.5–5.0)
Anion gap: 8 (ref 5–15)
BUN: 9 mg/dL (ref 6–20)
CO2: 24 mmol/L (ref 22–32)
Calcium: 9.2 mg/dL (ref 8.9–10.3)
Chloride: 104 mmol/L (ref 98–111)
Creatinine, Ser: 0.89 mg/dL (ref 0.61–1.24)
GFR calc non Af Amer: >60 mL/min (ref >60)
Glucose, Bld: 94 mg/dL (ref 70–99)
Potassium: 3.8 mmol/L (ref 3.5–5.1)
SODIUM: 136 mmol/L (ref 135–145)
Total Bilirubin: 0.5 mg/dL (ref 0.3–1.2)
Total Protein: 6 g/dL — ABNORMAL LOW (ref 6.5–8.1)

## 2018-05-04 LAB — CBC
HCT: 39.7 % (ref 39.0–52.0)
Hemoglobin: 13 g/dL (ref 13.0–17.0)
MCH: 29 pg (ref 26.0–34.0)
MCHC: 32.7 g/dL (ref 30.0–36.0)
MCV: 88.6 fL (ref 78.0–100.0)
PLATELETS: 249 10*3/uL (ref 150–400)
RBC: 4.48 MIL/uL (ref 4.22–5.81)
RDW: 15.5 % (ref 11.5–15.5)
WBC: 4.4 10*3/uL (ref 4.0–10.5)

## 2018-05-04 LAB — HIV ANTIBODY (ROUTINE TESTING W REFLEX): HIV SCREEN 4TH GENERATION: NONREACTIVE

## 2018-05-04 LAB — T4, FREE: Free T4: 0.75 ng/dL — ABNORMAL LOW (ref 0.82–1.77)

## 2018-05-04 LAB — HEPATITIS B SURFACE ANTIGEN: HEP B S AG: NEGATIVE

## 2018-05-04 LAB — HIGH SENSITIVITY CRP: CRP, High Sensitivity: 66.41 mg/L — ABNORMAL HIGH (ref 0.00–3.00)

## 2018-05-04 MED ORDER — BISACODYL 10 MG RE SUPP
10.0000 mg | Freq: Once | RECTAL | Status: AC
  Administered 2018-05-04: 10 mg via RECTAL
  Filled 2018-05-04: qty 1

## 2018-05-04 NOTE — Progress Notes (Addendum)
Subjective: The patient was seen and examined at bedside. He reports that he has been passing lactulose, denies bowel movements. He reports improvement in right lower quadrant abdominal pain. Denies nausea or vomiting. NG tube drainage in the canister is a total of 150 mL of light green bilious fluid in the last 24 hours.  Objective: Vital signs in last 24 hours: Temp:  [98.1 F (36.7 C)-98.5 F (36.9 C)] 98.2 F (36.8 C) (09/04 0522) Pulse Rate:  [79-88] 88 (09/04 0522) Resp:  [13-17] 17 (09/04 0522) BP: (140-144)/(83-91) 140/91 (09/04 0522) SpO2:  [95 %-99 %] 96 % (09/04 0522) Weight:  [62.4 kg] 62.4 kg (09/03 1330) Weight change:  Last BM Date: 04/29/18  PE:not in acute distress GENERAL:no pallor, no icterus ABDOMEN:soft, nondistended, nontender, normoactive bowel sounds EXTREMITIES:no edema, no deformity  Lab Results: Results for orders placed or performed during the hospital encounter of 05/03/18 (from the past 48 hour(s))  CBC with Differential     Status: Abnormal   Collection Time: 05/03/18  4:25 AM  Result Value Ref Range   WBC 9.6 4.0 - 10.5 K/uL   RBC 4.95 4.22 - 5.81 MIL/uL   Hemoglobin 14.1 13.0 - 17.0 g/dL   HCT 44.6 39.0 - 52.0 %   MCV 90.1 78.0 - 100.0 fL   MCH 28.5 26.0 - 34.0 pg   MCHC 31.6 30.0 - 36.0 g/dL   RDW 16.0 (H) 11.5 - 15.5 %   Platelets 257 150 - 400 K/uL   Neutrophils Relative % 74 %   Neutro Abs 7.1 1.7 - 7.7 K/uL   Lymphocytes Relative 8 %   Lymphs Abs 0.8 0.7 - 4.0 K/uL   Monocytes Relative 14 %   Monocytes Absolute 1.3 (H) 0.1 - 1.0 K/uL   Eosinophils Relative 3 %   Eosinophils Absolute 0.3 0.0 - 0.7 K/uL   Basophils Relative 1 %   Basophils Absolute 0.1 0.0 - 0.1 K/uL   Immature Granulocytes 0 %   Abs Immature Granulocytes 0.0 0.0 - 0.1 K/uL    Comment: Performed at [facility], [address] [place], [place] [address]  Comprehensive metabolic panel     Status: Abnormal   Collection Time: 05/03/18  4:25 AM  Result Value  Ref Range   Sodium 138 135 - 145 mmol/L   Potassium 4.2 3.5 - 5.1 mmol/L   Chloride 103 98 - 111 mmol/L   CO2 27 22 - 32 mmol/L   Glucose, Bld 102 (H) 70 - 99 mg/dL   BUN 8 6 - 20 mg/dL   Creatinine, Ser 1.00 0.61 - 1.24 mg/dL   Calcium 9.3 8.9 - 10.3 mg/dL   Total Protein 6.7 6.5 - 8.1 g/dL   Albumin 3.1 (L) 3.5 - 5.0 g/dL   AST 14 (L) 15 - 41 U/L   ALT 13 0 - 44 U/L   Alkaline Phosphatase 51 38 - 126 U/L   Total Bilirubin 0.7 0.3 - 1.2 mg/dL   GFR calc non Af Amer >60 >60 mL/min   GFR calc Af Amer >60 >60 mL/min    Comment: (NOTE) The eGFR has been calculated using the CKD EPI equation. This calculation has not been validated in all clinical situations. eGFR's persistently <60 mL/min signify possible Chronic Kidney Disease.    Anion gap 8 5 - 15    Comment: Performed at [facility] [address], [place], [place] [address]  Lipase, blood     Status: None   Collection  Time: 05/03/18  4:25 AM  Result Value Ref Range   Lipase 32 11 - 51 U/L    Comment: Performed at [facility] [address] [place], [place] [address]  Urinalysis, Routine w reflex microscopic     Status: Abnormal   Collection Time: 05/03/18  6:58 AM  Result Value Ref Range   Color, Urine YELLOW YELLOW   APPearance CLEAR CLEAR   Specific Gravity, Urine 1.043 (H) 1.005 - 1.030   pH 5.0 5.0 - 8.0   Glucose, UA NEGATIVE NEGATIVE mg/dL   Hgb urine dipstick NEGATIVE NEGATIVE   Bilirubin Urine NEGATIVE NEGATIVE   Ketones, ur NEGATIVE NEGATIVE mg/dL   Protein, ur NEGATIVE NEGATIVE mg/dL   Nitrite NEGATIVE NEGATIVE   Leukocytes, UA NEGATIVE NEGATIVE    Comment: Performed at [facility] [address], [place], [place] [address]  Hepatitis B surface antigen     Status: None   Collection Time: 05/03/18  4:16 PM  Result Value Ref Range   Hepatitis B Surface Ag Negative Negative    Comment: (NOTE) Performed At: [facility] [place], [place] [id number] Rush Farmer MD [id number]   Sedimentation rate     Status: Abnormal   Collection Time: 05/03/18  4:16 PM  Result Value Ref Range   Sed Rate 46 (H) 0 - 16 mm/hr    Comment: Performed at [facility] [address] [place], [place] [address]  High sensitivity CRP     Status: Abnormal   Collection Time: 05/03/18  4:16 PM  Result Value Ref Range   CRP, High Sensitivity 66.41 (H) 0.00 - 3.00 mg/L    Comment: (NOTE) Results confirmed on dilution.         Relative Risk for Future Cardiovascular Event                             Low                 <1.00                             Average       1.00 - 3.00                             High                >3.00 Performed At: [facility] [place], [place] [id number] Rush Farmer MD [id number]   Hemoglobin A1c     Status: Abnormal   Collection Time: 05/03/18  4:16 PM  Result Value Ref Range   Hgb A1c MFr Bld 6.0 (H) 4.8 - 5.6 %    Comment: (NOTE) Pre diabetes:          5.7%-6.4% Diabetes:              >6.4% Glycemic control for   <7.0% adults with diabetes    Mean Plasma Glucose 125.5 mg/dL    Comment: Performed at [facility] [address], [place], [place] [address]  Magnesium     Status: None   Collection Time: 05/03/18  4:54 PM  Result Value Ref Range   Magnesium 1.9 1.7 - 2.4 mg/dL    Comment: Performed at [facility], [place] [address], [place], [place]  [address]  TSH     Status: Abnormal   Collection Time: 05/03/18  6:32 PM  Result Value Ref Range   TSH 0.300 (L) 0.350 - 4.500 uIU/mL    Comment: Performed by a 3rd Generation assay with a functional sensitivity of <=0.01 uIU/mL. Performed at [facility], [place] [address] [place], [place] [address]   HIV antibody     Status: None   Collection Time: 05/03/18  6:32 PM  Result Value Ref Range   HIV Screen 4th Generation wRfx Non Reactive Non Reactive    Comment: (NOTE) Performed At: [facility] [place], [place] [id number] Rush Farmer MD [id number]   Comprehensive metabolic panel     Status: Abnormal   Collection Time: 05/04/18  6:11 AM  Result Value Ref Range   Sodium 136 135 - 145 mmol/L   Potassium 3.8 3.5 - 5.1 mmol/L   Chloride 104 98 - 111 mmol/L   CO2 24 22 - 32 mmol/L   Glucose, Bld 94 70 - 99 mg/dL   BUN 9 6 - 20 mg/dL   Creatinine, Ser 0.89 0.61 - 1.24 mg/dL   Calcium 9.2 8.9 - 10.3 mg/dL   Total Protein 6.0 (L) 6.5 - 8.1 g/dL   Albumin 2.6 (L) 3.5 - 5.0 g/dL   AST 13 (L) 15 - 41 U/L   ALT 12 0 - 44 U/L   Alkaline Phosphatase 44 38 - 126 U/L   Total Bilirubin 0.5 0.3 - 1.2 mg/dL   GFR calc non Af Amer >60 >60 mL/min   GFR calc Af Amer >60 >60 mL/min    Comment: (NOTE) The eGFR has been calculated using the CKD EPI equation. This calculation has not been validated in all clinical situations. eGFR's persistently <60 mL/min signify possible Chronic Kidney Disease.    Anion gap 8 5 - 15    Comment: Performed at [facility] [address] [place], [place] [address]  CBC     Status: None   Collection Time: 05/04/18  6:11 AM  Result Value Ref Range   WBC 4.4 4.0 - 10.5 K/uL   RBC 4.48 4.22 - 5.81 MIL/uL   Hemoglobin 13.0 13.0 - 17.0 g/dL   HCT 39.7 39.0 - 52.0 %   MCV 88.6 78.0 - 100.0 fL   MCH 29.0 26.0 - 34.0 pg   MCHC 32.7 30.0 - 36.0 g/dL   RDW 15.5 11.5 - 15.5 %   Platelets 249 150 - 400 K/uL    Comment: Performed at [facility] [address] [place], [place] [address]    Studies/Results: Ct Abdomen Pelvis W Contrast  Result Date: 05/03/2018 CLINICAL DATA:  Upper abdominal pain and constipation for 3 days. Vomiting. Recent hospital admission for small-bowel obstruction. EXAM: CT ABDOMEN AND PELVIS WITH CONTRAST TECHNIQUE: Multidetector CT imaging of the abdomen and pelvis was performed using the standard protocol following bolus administration of intravenous contrast. CONTRAST:  100 mL ISOVUE-300 IOPAMIDOL (ISOVUE-300) INJECTION  61% COMPARISON:  None. FINDINGS: LOWER CHEST: Mild bronchial wall thickening and tiny tree-in-bud infiltrates most compatible with respiratory bronchiolitis. Lung bases are clear. Included heart size is normal. No pericardial effusion. HEPATOBILIARY: 19 mm homogeneously hypodense benign-appearing cyst RIGHT lobe of the liver. Subcentimeter probable flash filling hemangioma segment 4 of the liver. PANCREAS: Normal. SPLEEN: Normal. ADRENALS/URINARY TRACT: Kidneys are orthotopic, demonstrating symmetric enhancement. Mild cortical thinning lower pole RIGHT kidney. No nephrolithiasis, hydronephrosis or solid renal masses. The unopacified ureters  are normal in course and caliber. Delayed imaging through the kidneys demonstrates symmetric prompt contrast excretion within the proximal urinary collecting system. Urinary bladder is partially distended and unremarkable. Normal adrenal glands. STOMACH/BOWEL: Small bowel dilated to 4.2 cm with air-fluid levels, transition point at terminal ileum associated with bowel wall thickening and inflammatory changes. Decompressed colon. The appendix is not discretely identified. VASCULAR/LYMPHATIC: Aortoiliac vessels are normal in course and caliber. No lymphadenopathy by CT size criteria. REPRODUCTIVE: Normal. OTHER: Small amount of free fluid in the pelvis. No intraperitoneal free air or focal fluid collections. MUSCULOSKELETAL: Nonacute. IMPRESSION: 1. Small-bowel obstruction with transition point at inflamed terminal ileum concerning for inflammatory bowel disease. 2. Mild suspected respiratory bronchiolitis with bronchitic changes. Electronically Signed   By: Elon Alas M.D.   On: 05/03/2018 06:26    Medications: I have reviewed the patient's current medications.  Assessment: Small bowel obstruction with transition point at inflamed terminal ileum concerning for inflammatory bowel disease. Elevated CRP of 66.41 Elevated ESR of 64 Low albumin of  2.6  Plan: Abdominal x-ray done today morning, results pending. If there is resolution and small bowel obstruction,next step would be clamping of NG tube and starting the patient on clear liquid diet. Imaging and lab workup compatible with IBD possible Crohn's disease, will eventually need a diagnostic colonoscopy with terminal ileal biopsies, more likely as an outpatient when patient is able to tolerate colonic prep. HBs antigen negative, TB gold mitogen result pending-in anticipation that patient may require immunosuppressant/Biologics for long-term therapy. For now, continue normal saline at 100 mL an hour, continue Solu-Medrol 40 mg IV daily, continue empiric treatment with Cipro and Flagyl IV. Encourage early mobilization, out of bed to chair and walking in the hallways and minimizing use of narcotics   ADDENDUM: Xray from today shows mild improvement compared to yesterday's CT, but he continues to have mildly dilated loops of small bowel in the pelvis and RUQ. Recommend continue NG tube to low intermittent suction today, Xray in am tomorrow, if SB dilation resolves tomorrow, plan to clamp NG tube and start clear liquid diet thereafter. Discussed with patient's nurse over the phone.  Ronnette Juniper 05/04/2018, 8:41 AM   Pager [phone] If no answer or after 5 PM call [phone]

## 2018-05-04 NOTE — Care Management Note (Signed)
Case Management Note  Patient Details  Name: Macsen Nuttall MRN: [id number] Date of Birth: August 16, 1976  Subjective/Objective:            Admitted w SBO        Action/Plan:  Spoke w patient at bedside. He is from home and independent PTA. He states that he lives with his parents and is currently unemployed. He states he usually seeks health care from ER as needed, aprox every 2-3 years.   CM will follow for assistance with medications. Patient provided with  resources for [facility].   Expected Discharge Date:                  Expected Discharge Plan:     In-House Referral:     Discharge planning Services  CM Consult  Post Acute Care Choice:    Choice offered to:     DME Arranged:    DME Agency:     HH Arranged:    HH Agency:     Status of Service:  In process, will continue to follow  If discussed at Long Length of Stay Meetings, dates discussed:    Additional Comments:  Carles Collet, RN 05/04/2018, 11:11 AM

## 2018-05-04 NOTE — Progress Notes (Signed)
[facility] PROGRESS NOTE  Harbert Fitterer MCN:[id number] DOB: [birth year] DOA: 05/03/2018   PCP: Patient, No Pcp Per     Assessment/Plan: Principal Problem:   SBO (small bowel obstruction) ([place]) Active Problems:   Asthma  42 year old male with suspected Crohn's disease  Admitted for small bowel obstruction  Assessment and plan SBO (small bowel obstruction) ([place]) Suspect due to crohn's stricture , no prior hx of abdominal surgeries continue iv solumedrol , iv cipro, flagyl,IVF [facility] consulted , Dr Therisa Doyne , abdominal KUB shows mild improvement Continue NG tube to suction Hypoactive bowel sounds Trial of Dulcolax suppository Further workup as per gastroenterology-ESR  64, CRP 66      Asthma stable , without exacerbation stable from a pulmonary standpoint Changes on x-ray appeared to be chronic, patient is asymptomatic   DVT prophylaxsis heparin  Code Status:  Full code   Family Communication: Discussed in detail with the patient, all imaging results, lab results explained to the patient   Disposition Plan:  As above       Consultants:  gastroenterology  Procedures:  none  Antibiotics: Anti-infectives (From admission, onward)   Start     Dose/Rate Route Frequency Ordered Stop   05/03/18 0930  ciprofloxacin (CIPRO) IVPB 400 mg     400 mg 200 mL/hr over 60 Minutes Intravenous Every 12 hours 05/03/18 0843     05/03/18 0900  metroNIDAZOLE (FLAGYL) IVPB 500 mg     500 mg 100 mL/hr over 60 Minutes Intravenous Every 8 hours 05/03/18 0843           HPI/Subjective:  abdominal pain resolved, nausea improved, still has significant output from NG tube, 970 out in the last 24 hours  Objective: Vitals:   05/03/18 2147 05/04/18 0255 05/04/18 0310 05/04/18 0522  BP: (!) 144/91   (!) 140/91  Pulse: 88   88  Resp: 13   17  Temp: 98.1 F (36.7 C)   98.2 F (36.8 C)  TempSrc: Oral   Tympanic  SpO2: 98% 95% 95% 96%  Weight:      Height:         Intake/Output Summary (Last 24 hours) at 05/04/2018 1324 Last data filed at 05/04/2018 0900 Gross per 24 hour  Intake 1895.53 ml  Output 925 ml  Net 970.53 ml    Exam:  Examination:  General exam: Appears calm and comfortable  Respiratory system: Clear to auscultation. Respiratory effort normal. Cardiovascular system: S1 & S2 heard, RRR. No JVD, murmurs, rubs, gallops or clicks. No pedal edema. Gastrointestinal system: Abdomen is nondistended, soft and nontender. No organomegaly or masses felt. Normal bowel sounds heard. Central nervous system: Alert and oriented. No focal neurological deficits. Extremities: Symmetric 5 x 5 power. Skin: No rashes, lesions or ulcers Psychiatry: Judgement and insight appear normal. Mood & affect appropriate.     Data Reviewed: I have personally reviewed following labs and imaging studies  Micro Results No results found for this or any previous visit (from the past 240 hour(s)).  Radiology Reports Abd 1 View (kub)  Result Date: 05/04/2018 CLINICAL DATA:  Abdominal pain and nausea. Vomiting. CT scan yesterday showed an inflamed terminal ileum and small bowel dilatation proximal to this point. EXAM: ABDOMEN - 1 VIEW COMPARISON:  CT abdomen 05/03/2018 FINDINGS: Mildly dilated loops of small bowel in the pelvis and right upper quadrant. Small amount of formed stool in the colon. No significant abnormal calcifications. IMPRESSION: 1. Mildly dilated loops of small bowel in  the right upper quadrant and in the pelvis. This does appear mildly improved compared to yesterday's CT exam where the dilated small bowel loops were more striking. Electronically Signed   By: Van Clines M.D.   On: 05/04/2018 10:38   Ct Abdomen Pelvis W Contrast  Result Date: 05/03/2018 CLINICAL DATA:  Upper abdominal pain and constipation for 3 days. Vomiting. Recent hospital admission for small-bowel obstruction. EXAM: CT ABDOMEN AND PELVIS WITH CONTRAST TECHNIQUE: Multidetector  CT imaging of the abdomen and pelvis was performed using the standard protocol following bolus administration of intravenous contrast. CONTRAST:  100 mL ISOVUE-300 IOPAMIDOL (ISOVUE-300) INJECTION 61% COMPARISON:  None. FINDINGS: LOWER CHEST: Mild bronchial wall thickening and tiny tree-in-bud infiltrates most compatible with respiratory bronchiolitis. Lung bases are clear. Included heart size is normal. No pericardial effusion. HEPATOBILIARY: 19 mm homogeneously hypodense benign-appearing cyst RIGHT lobe of the liver. Subcentimeter probable flash filling hemangioma segment 4 of the liver. PANCREAS: Normal. SPLEEN: Normal. ADRENALS/URINARY TRACT: Kidneys are orthotopic, demonstrating symmetric enhancement. Mild cortical thinning lower pole RIGHT kidney. No nephrolithiasis, hydronephrosis or solid renal masses. The unopacified ureters are normal in course and caliber. Delayed imaging through the kidneys demonstrates symmetric prompt contrast excretion within the proximal urinary collecting system. Urinary bladder is partially distended and unremarkable. Normal adrenal glands. STOMACH/BOWEL: Small bowel dilated to 4.2 cm with air-fluid levels, transition point at terminal ileum associated with bowel wall thickening and inflammatory changes. Decompressed colon. The appendix is not discretely identified. VASCULAR/LYMPHATIC: Aortoiliac vessels are normal in course and caliber. No lymphadenopathy by CT size criteria. REPRODUCTIVE: Normal. OTHER: Small amount of free fluid in the pelvis. No intraperitoneal free air or focal fluid collections. MUSCULOSKELETAL: Nonacute. IMPRESSION: 1. Small-bowel obstruction with transition point at inflamed terminal ileum concerning for inflammatory bowel disease. 2. Mild suspected respiratory bronchiolitis with bronchitic changes. Electronically Signed   By: Elon Alas M.D.   On: 05/03/2018 06:26     CBC Recent Labs  Lab 05/03/18 0425 05/04/18 0611  WBC 9.6 4.4  HGB 14.1  13.0  HCT 44.6 39.7  PLT 257 249  MCV 90.1 88.6  MCH 28.5 29.0  MCHC 31.6 32.7  RDW 16.0* 15.5  LYMPHSABS 0.8  --   MONOABS 1.3*  --   EOSABS 0.3  --   BASOSABS 0.1  --     Chemistries  Recent Labs  Lab 05/03/18 0425 05/03/18 1654 05/04/18 0611  NA 138  --  136  K 4.2  --  3.8  CL 103  --  104  CO2 27  --  24  GLUCOSE 102*  --  94  BUN 8  --  9  CREATININE 1.00  --  0.89  CALCIUM 9.3  --  9.2  MG  --  1.9  --   AST 14*  --  13*  ALT 13  --  12  ALKPHOS 51  --  44  BILITOT 0.7  --  0.5   ------------------------------------------------------------------------------------------------------------------ estimated creatinine clearance is 97.4 mL/min (by C-G formula based on SCr of 0.89 mg/dL). ------------------------------------------------------------------------------------------------------------------ Recent Labs    05/03/18 1616  HGBA1C 6.0*   ------------------------------------------------------------------------------------------------------------------ No results for input(s): CHOL, HDL, LDLCALC, TRIG, CHOLHDL, LDLDIRECT in the last 72 hours. ------------------------------------------------------------------------------------------------------------------ Recent Labs    05/03/18 1832  TSH 0.300*   ------------------------------------------------------------------------------------------------------------------ No results for input(s): VITAMINB12, FOLATE, FERRITIN, TIBC, IRON, RETICCTPCT in the last 72 hours.  Coagulation profile No results for input(s): INR, PROTIME in the last 168 hours.  No results for input(s):  DDIMER in the last 72 hours.  Cardiac Enzymes No results for input(s): CKMB, TROPONINI, MYOGLOBIN in the last 168 hours.  Invalid input(s): CK ------------------------------------------------------------------------------------------------------------------ Invalid input(s): POCBNP   CBG: No results for input(s): GLUCAP in the last 168  hours.     Studies: Abd 1 View (kub)  Result Date: 05/04/2018 CLINICAL DATA:  Abdominal pain and nausea. Vomiting. CT scan yesterday showed an inflamed terminal ileum and small bowel dilatation proximal to this point. EXAM: ABDOMEN - 1 VIEW COMPARISON:  CT abdomen 05/03/2018 FINDINGS: Mildly dilated loops of small bowel in the pelvis and right upper quadrant. Small amount of formed stool in the colon. No significant abnormal calcifications. IMPRESSION: 1. Mildly dilated loops of small bowel in the right upper quadrant and in the pelvis. This does appear mildly improved compared to yesterday's CT exam where the dilated small bowel loops were more striking. Electronically Signed   By: Van Clines M.D.   On: 05/04/2018 10:38   Ct Abdomen Pelvis W Contrast  Result Date: 05/03/2018 CLINICAL DATA:  Upper abdominal pain and constipation for 3 days. Vomiting. Recent hospital admission for small-bowel obstruction. EXAM: CT ABDOMEN AND PELVIS WITH CONTRAST TECHNIQUE: Multidetector CT imaging of the abdomen and pelvis was performed using the standard protocol following bolus administration of intravenous contrast. CONTRAST:  100 mL ISOVUE-300 IOPAMIDOL (ISOVUE-300) INJECTION 61% COMPARISON:  None. FINDINGS: LOWER CHEST: Mild bronchial wall thickening and tiny tree-in-bud infiltrates most compatible with respiratory bronchiolitis. Lung bases are clear. Included heart size is normal. No pericardial effusion. HEPATOBILIARY: 19 mm homogeneously hypodense benign-appearing cyst RIGHT lobe of the liver. Subcentimeter probable flash filling hemangioma segment 4 of the liver. PANCREAS: Normal. SPLEEN: Normal. ADRENALS/URINARY TRACT: Kidneys are orthotopic, demonstrating symmetric enhancement. Mild cortical thinning lower pole RIGHT kidney. No nephrolithiasis, hydronephrosis or solid renal masses. The unopacified ureters are normal in course and caliber. Delayed imaging through the kidneys demonstrates symmetric prompt  contrast excretion within the proximal urinary collecting system. Urinary bladder is partially distended and unremarkable. Normal adrenal glands. STOMACH/BOWEL: Small bowel dilated to 4.2 cm with air-fluid levels, transition point at terminal ileum associated with bowel wall thickening and inflammatory changes. Decompressed colon. The appendix is not discretely identified. VASCULAR/LYMPHATIC: Aortoiliac vessels are normal in course and caliber. No lymphadenopathy by CT size criteria. REPRODUCTIVE: Normal. OTHER: Small amount of free fluid in the pelvis. No intraperitoneal free air or focal fluid collections. MUSCULOSKELETAL: Nonacute. IMPRESSION: 1. Small-bowel obstruction with transition point at inflamed terminal ileum concerning for inflammatory bowel disease. 2. Mild suspected respiratory bronchiolitis with bronchitic changes. Electronically Signed   By: Elon Alas M.D.   On: 05/03/2018 06:26      Lab Results  Component Value Date   HGBA1C 6.0 (H) 05/03/2018   Lab Results  Component Value Date   CREATININE 0.89 05/04/2018       Scheduled Meds: . heparin  5,000 Units Subcutaneous Q8H  . methylPREDNISolone (SOLU-MEDROL) injection  40 mg Intravenous Daily   Continuous Infusions: . sodium chloride Stopped (05/04/18 0614)  . ciprofloxacin 400 mg (05/04/18 1055)  . metronidazole 100 mL/hr at 05/04/18 0643     LOS: 1 day    Time spent: >30 MINS    Reyne Dumas  [facility] Pager [phone]. If 7PM-7AM, please contact night-coverage at [email or web address], password [facility] 05/04/2018, 1:24 PM  LOS: 1 day

## 2018-05-05 ENCOUNTER — Inpatient Hospital Stay (HOSPITAL_COMMUNITY): Payer: Self-pay

## 2018-05-05 NOTE — Care Management Note (Signed)
Case Management Note  Patient Details  Name: Eric Castillo MRN: [id number] Date of Birth: [birth year]  Subjective/Objective:                    Action/Plan:  NCM can provide MATCH at DC. Await prescriptions. Expected Discharge Date:                  Expected Discharge Plan:     In-House Referral:     Discharge planning Services  CM Consult, Medication Assistance, [place] [facility]  Post Acute Care Choice:    Choice offered to:     DME Arranged:    DME Agency:     HH Arranged:    HH Agency:     Status of Service:  In process, will continue to follow  If discussed at Long Length of Stay Meetings, dates discussed:    Additional Comments:  Marilu Favre, RN 05/05/2018, 10:50 AM

## 2018-05-05 NOTE — Progress Notes (Addendum)
[facility] PROGRESS NOTE  Vaishnav Demartin RXV:[id number] DOB: 1976-06-05 DOA: 05/03/2018   PCP: Patient, No Pcp Per     Assessment/Plan: Principal Problem:   SBO (small bowel obstruction) (HCC) Active Problems:   Asthma   42 year old male with suspected Crohn's disease  Admitted for small bowel obstruction  Assessment and plan SBO (small bowel obstruction) (HCC) Suspect due to crohn's stricture , no prior hx of abdominal surgeries continue iv solumedrol , iv cipro, flagyl,IVF [facility] consulted , Dr Therisa Doyne , abdominal KUB shows mild improvement NG tube discontinued, GI has advance patient's diet to clear liquids>solids if tolerated  We'll switch to oral steroids and antibiotics tomorrow Further workup as per gastroenterology-ESR  64, CRP 66      Asthma stable , without exacerbation stable from a pulmonary standpoint Changes on x-ray appeared to be chronic, patient is asymptomatic   DVT prophylaxsis heparin  Code Status:  Full code   Family Communication: Discussed in detail with the patient, all imaging results, lab results explained to the patient   Disposition Plan:   Anticipate discharge tomorrow       Consultants:  gastroenterology  Procedures:  none  Antibiotics: Anti-infectives (From admission, onward)   Start     Dose/Rate Route Frequency Ordered Stop   05/03/18 0930  ciprofloxacin (CIPRO) IVPB 400 mg     400 mg 200 mL/hr over 60 Minutes Intravenous Every 12 hours 05/03/18 0843     05/03/18 0900  metroNIDAZOLE (FLAGYL) IVPB 500 mg     500 mg 100 mL/hr over 60 Minutes Intravenous Every 8 hours 05/03/18 0843           HPI/Subjective:  NG tube discontinued, patient started on clear liquid diet  Objective: Vitals:   05/04/18 0522 05/04/18 1454 05/04/18 2228 05/05/18 0547  BP: (!) 140/91 118/74 129/76 130/89  Pulse: 88 82 78 84  Resp: 17   18  Temp: 98.2 F (36.8 C) 97.7 F (36.5 C) 98.3 F (36.8 C)   TempSrc: Tympanic Oral  Oral   SpO2: 96% 100% 98% 100%  Weight:      Height:        Intake/Output Summary (Last 24 hours) at 05/05/2018 1238 Last data filed at 05/05/2018 0600 Gross per 24 hour  Intake 1247.43 ml  Output 200 ml  Net 1047.43 ml    Exam:  Examination:  General exam: Appears calm and comfortable  Respiratory system: Clear to auscultation. Respiratory effort normal. Cardiovascular system: S1 & S2 heard, RRR. No JVD, murmurs, rubs, gallops or clicks. No pedal edema. Gastrointestinal system: Abdomen is nondistended, soft and nontender. No organomegaly or masses felt. Normal bowel sounds heard. Central nervous system: Alert and oriented. No focal neurological deficits. Extremities: Symmetric 5 x 5 power. Skin: No rashes, lesions or ulcers Psychiatry: Judgement and insight appear normal. Mood & affect appropriate.     Data Reviewed: I have personally reviewed following labs and imaging studies  Micro Results No results found for this or any previous visit (from the past 240 hour(s)).  Radiology Reports Dg Abd 1 View  Result Date: 05/05/2018 CLINICAL DATA:  Follow-up small bowel obstruction. NG tube present. Recent bowel movements. EXAM: ABDOMEN - 1 VIEW COMPARISON:  05/04/2018 FINDINGS: Scattered gas and stool in the colon. Gas-filled nondistended left upper quadrant small bowel. No dilated small bowel identified. Enteric tube tip is in the left upper quadrant consistent with location in the upper stomach. No radiopaque stones. Visualized bones and soft  tissue contours appear intact. Calcified phleboliths in the pelvis. IMPRESSION: Enteric tube tip is in the left upper quadrant consistent with location in the upper stomach. Gas-filled nondilated small bowel in the left upper quadrant. Electronically Signed   By: Lucienne Capers M.D.   On: 05/05/2018 06:54   Abd 1 View (kub)  Result Date: 05/04/2018 CLINICAL DATA:  Abdominal pain and nausea. Vomiting. CT scan yesterday showed an inflamed terminal  ileum and small bowel dilatation proximal to this point. EXAM: ABDOMEN - 1 VIEW COMPARISON:  CT abdomen 05/03/2018 FINDINGS: Mildly dilated loops of small bowel in the pelvis and right upper quadrant. Small amount of formed stool in the colon. No significant abnormal calcifications. IMPRESSION: 1. Mildly dilated loops of small bowel in the right upper quadrant and in the pelvis. This does appear mildly improved compared to yesterday's CT exam where the dilated small bowel loops were more striking. Electronically Signed   By: Van Clines M.D.   On: 05/04/2018 10:38   Ct Abdomen Pelvis W Contrast  Result Date: 05/03/2018 CLINICAL DATA:  Upper abdominal pain and constipation for 3 days. Vomiting. Recent hospital admission for small-bowel obstruction. EXAM: CT ABDOMEN AND PELVIS WITH CONTRAST TECHNIQUE: Multidetector CT imaging of the abdomen and pelvis was performed using the standard protocol following bolus administration of intravenous contrast. CONTRAST:  100 mL ISOVUE-300 IOPAMIDOL (ISOVUE-300) INJECTION 61% COMPARISON:  None. FINDINGS: LOWER CHEST: Mild bronchial wall thickening and tiny tree-in-bud infiltrates most compatible with respiratory bronchiolitis. Lung bases are clear. Included heart size is normal. No pericardial effusion. HEPATOBILIARY: 19 mm homogeneously hypodense benign-appearing cyst RIGHT lobe of the liver. Subcentimeter probable flash filling hemangioma segment 4 of the liver. PANCREAS: Normal. SPLEEN: Normal. ADRENALS/URINARY TRACT: Kidneys are orthotopic, demonstrating symmetric enhancement. Mild cortical thinning lower pole RIGHT kidney. No nephrolithiasis, hydronephrosis or solid renal masses. The unopacified ureters are normal in course and caliber. Delayed imaging through the kidneys demonstrates symmetric prompt contrast excretion within the proximal urinary collecting system. Urinary bladder is partially distended and unremarkable. Normal adrenal glands. STOMACH/BOWEL: Small  bowel dilated to 4.2 cm with air-fluid levels, transition point at terminal ileum associated with bowel wall thickening and inflammatory changes. Decompressed colon. The appendix is not discretely identified. VASCULAR/LYMPHATIC: Aortoiliac vessels are normal in course and caliber. No lymphadenopathy by CT size criteria. REPRODUCTIVE: Normal. OTHER: Small amount of free fluid in the pelvis. No intraperitoneal free air or focal fluid collections. MUSCULOSKELETAL: Nonacute. IMPRESSION: 1. Small-bowel obstruction with transition point at inflamed terminal ileum concerning for inflammatory bowel disease. 2. Mild suspected respiratory bronchiolitis with bronchitic changes. Electronically Signed   By: Elon Alas M.D.   On: 05/03/2018 06:26     CBC Recent Labs  Lab 05/03/18 0425 05/04/18 0611  WBC 9.6 4.4  HGB 14.1 13.0  HCT 44.6 39.7  PLT 257 249  MCV 90.1 88.6  MCH 28.5 29.0  MCHC 31.6 32.7  RDW 16.0* 15.5  LYMPHSABS 0.8  --   MONOABS 1.3*  --   EOSABS 0.3  --   BASOSABS 0.1  --     Chemistries  Recent Labs  Lab 05/03/18 0425 05/03/18 1654 05/04/18 0611  NA 138  --  136  K 4.2  --  3.8  CL 103  --  104  CO2 27  --  24  GLUCOSE 102*  --  94  BUN 8  --  9  CREATININE 1.00  --  0.89  CALCIUM 9.3  --  9.2  MG  --  1.9  --   AST 14*  --  13*  ALT 13  --  12  ALKPHOS 51  --  44  BILITOT 0.7  --  0.5   ------------------------------------------------------------------------------------------------------------------ estimated creatinine clearance is 97.4 mL/min (by C-G formula based on SCr of 0.89 mg/dL). ------------------------------------------------------------------------------------------------------------------ Recent Labs    05/03/18 1616  HGBA1C 6.0*   ------------------------------------------------------------------------------------------------------------------ No results for input(s): CHOL, HDL, LDLCALC, TRIG, CHOLHDL, LDLDIRECT in the last 72  hours. ------------------------------------------------------------------------------------------------------------------ Recent Labs    05/03/18 1832  TSH 0.300*   ------------------------------------------------------------------------------------------------------------------ No results for input(s): VITAMINB12, FOLATE, FERRITIN, TIBC, IRON, RETICCTPCT in the last 72 hours.  Coagulation profile No results for input(s): INR, PROTIME in the last 168 hours.  No results for input(s): DDIMER in the last 72 hours.  Cardiac Enzymes No results for input(s): CKMB, TROPONINI, MYOGLOBIN in the last 168 hours.  Invalid input(s): CK ------------------------------------------------------------------------------------------------------------------ Invalid input(s): POCBNP   CBG: No results for input(s): GLUCAP in the last 168 hours.     Studies: Dg Abd 1 View  Result Date: 05/05/2018 CLINICAL DATA:  Follow-up small bowel obstruction. NG tube present. Recent bowel movements. EXAM: ABDOMEN - 1 VIEW COMPARISON:  05/04/2018 FINDINGS: Scattered gas and stool in the colon. Gas-filled nondistended left upper quadrant small bowel. No dilated small bowel identified. Enteric tube tip is in the left upper quadrant consistent with location in the upper stomach. No radiopaque stones. Visualized bones and soft tissue contours appear intact. Calcified phleboliths in the pelvis. IMPRESSION: Enteric tube tip is in the left upper quadrant consistent with location in the upper stomach. Gas-filled nondilated small bowel in the left upper quadrant. Electronically Signed   By: Lucienne Capers M.D.   On: 05/05/2018 06:54   Abd 1 View (kub)  Result Date: 05/04/2018 CLINICAL DATA:  Abdominal pain and nausea. Vomiting. CT scan yesterday showed an inflamed terminal ileum and small bowel dilatation proximal to this point. EXAM: ABDOMEN - 1 VIEW COMPARISON:  CT abdomen 05/03/2018 FINDINGS: Mildly dilated loops of small  bowel in the pelvis and right upper quadrant. Small amount of formed stool in the colon. No significant abnormal calcifications. IMPRESSION: 1. Mildly dilated loops of small bowel in the right upper quadrant and in the pelvis. This does appear mildly improved compared to yesterday's CT exam where the dilated small bowel loops were more striking. Electronically Signed   By: Van Clines M.D.   On: 05/04/2018 10:38      Lab Results  Component Value Date   HGBA1C 6.0 (H) 05/03/2018   Lab Results  Component Value Date   CREATININE 0.89 05/04/2018       Scheduled Meds: . heparin  5,000 Units Subcutaneous Q8H  . methylPREDNISolone (SOLU-MEDROL) injection  40 mg Intravenous Daily   Continuous Infusions: . sodium chloride 100 mL/hr at 05/04/18 1504  . ciprofloxacin 400 mg (05/05/18 1104)  . metronidazole 500 mg (05/05/18 0544)     LOS: 2 days    Time spent: >30 MINS    Reyne Dumas  [facility] Pager [phone]. If 7PM-7AM, please contact night-coverage at [email or web address], password [facility] 05/05/2018, 12:38 PM  LOS: 2 days

## 2018-05-05 NOTE — Progress Notes (Signed)
Subjective: Seen and examined at bedside. He reports having a bowel movement yesterday morning and yesterday evening described as moderate amount of semi-formed stool without blood in it. Denies abdominal pain, nausea or vomiting.  Objective: Vital signs in last 24 hours: Temp:  [97.7 F (36.5 C)-98.3 F (36.8 C)] 98.3 F (36.8 C) (09/04 2228) Pulse Rate:  [78-84] 84 (09/05 0547) Resp:  [18] 18 (09/05 0547) BP: (118-130)/(74-89) 130/89 (09/05 0547) SpO2:  [98 %-100 %] 100 % (09/05 0547) Weight change:  Last BM Date: 05/05/18  OZ:DGUYQI built, no obvious pallor or icterus GENERAL:not in distress ABDOMEN:soft, nondistended, nontender, normoactive bowel sounds EXTREMITIES:no deformity, no edema  Lab Results: Results for orders placed or performed during the hospital encounter of 05/03/18 (from the past 48 hour(s))  Hepatitis B surface antigen     Status: None   Collection Time: 05/03/18  4:16 PM  Result Value Ref Range   Hepatitis B Surface Ag Negative Negative    Comment: (NOTE) Performed At: [facility] [place], [place] [id number] Rush Farmer MD [id number]   Sedimentation rate     Status: Abnormal   Collection Time: 05/03/18  4:16 PM  Result Value Ref Range   Sed Rate 46 (H) 0 - 16 mm/hr    Comment: Performed at [facility], [place] [address], [place], [place] [address]  High sensitivity CRP     Status: Abnormal   Collection Time: 05/03/18  4:16 PM  Result Value Ref Range   CRP, High Sensitivity 66.41 (H) 0.00 - 3.00 mg/L    Comment: (NOTE) Results confirmed on dilution.         Relative Risk for Future Cardiovascular Event                             Low                 <1.00                             Average       1.00 - 3.00                             High                >3.00 Performed At: [facility] [place], [place] [id number] Rush Farmer MD UX:[id number]   Hemoglobin A1c     Status: Abnormal    Collection Time: 05/03/18  4:16 PM  Result Value Ref Range   Hgb A1c MFr Bld 6.0 (H) 4.8 - 5.6 %    Comment: (NOTE) Pre diabetes:          5.7%-6.4% Diabetes:              >6.4% Glycemic control for   <7.0% adults with diabetes    Mean Plasma Glucose 125.5 mg/dL    Comment: Performed at [facility] [address] [place], [place] [address]  Magnesium     Status: None   Collection Time: 05/03/18  4:54 PM  Result Value Ref Range   Magnesium 1.9 1.7 - 2.4 mg/dL    Comment: Performed at [facility], [place] [address], [place], [place] [address]  TSH     Status: Abnormal   Collection Time: 05/03/18  6:32 PM  Result Value Ref Range   TSH 0.300 (L) 0.350 - 4.500 uIU/mL    Comment: Performed by a 3rd Generation assay with a functional sensitivity of <=0.01 uIU/mL. Performed at [facility], [place] [address], [place], [place] [address]   HIV antibody     Status: None   Collection Time: 05/03/18  6:32 PM  Result Value Ref Range   HIV Screen 4th Generation wRfx Non Reactive Non Reactive    Comment: (NOTE) Performed At: [facility] [place], [place] [id number] Rush Farmer MD [id number]   Comprehensive metabolic panel     Status: Abnormal   Collection Time: 05/04/18  6:11 AM  Result Value Ref Range   Sodium 136 135 - 145 mmol/L   Potassium 3.8 3.5 - 5.1 mmol/L   Chloride 104 98 - 111 mmol/L   CO2 24 22 - 32 mmol/L   Glucose, Bld 94 70 - 99 mg/dL   BUN 9 6 - 20 mg/dL   Creatinine, Ser 0.89 0.61 - 1.24 mg/dL   Calcium 9.2 8.9 - 10.3 mg/dL   Total Protein 6.0 (L) 6.5 - 8.1 g/dL   Albumin 2.6 (L) 3.5 - 5.0 g/dL   AST 13 (L) 15 - 41 U/L   ALT 12 0 - 44 U/L   Alkaline Phosphatase 44 38 - 126 U/L   Total Bilirubin 0.5 0.3 - 1.2 mg/dL   GFR calc non Af Amer >60 >60 mL/min   GFR calc Af Amer >60 >60 mL/min    Comment: (NOTE) The eGFR has been calculated using the CKD EPI equation. This calculation has not been validated in all clinical  situations. eGFR's persistently <60 mL/min signify possible Chronic Kidney Disease.    Anion gap 8 5 - 15    Comment: Performed at [facility] [address], [place], [place] [address]  CBC     Status: None   Collection Time: 05/04/18  6:11 AM  Result Value Ref Range   WBC 4.4 4.0 - 10.5 K/uL   RBC 4.48 4.22 - 5.81 MIL/uL   Hemoglobin 13.0 13.0 - 17.0 g/dL   HCT 39.7 39.0 - 52.0 %   MCV 88.6 78.0 - 100.0 fL   MCH 29.0 26.0 - 34.0 pg   MCHC 32.7 30.0 - 36.0 g/dL   RDW 15.5 11.5 - 15.5 %   Platelets 249 150 - 400 K/uL    Comment: Performed at [facility] [address] [place], [place] [address]  T4, free     Status: Abnormal   Collection Time: 05/04/18 12:32 PM  Result Value Ref Range   Free T4 0.75 (L) 0.82 - 1.77 ng/dL    Comment: (NOTE) Biotin ingestion may interfere with free T4 tests. If the results are inconsistent with the TSH level, previous test results, or the clinical presentation, then consider biotin interference. If needed, order repeat testing after stopping biotin. Performed at [facility], [place] [address], [place], [place] [address]     Studies/Results: Dg Abd 1 View  Result Date: 05/05/2018 CLINICAL DATA:  Follow-up small bowel obstruction. NG tube present. Recent bowel movements. EXAM: ABDOMEN - 1 VIEW COMPARISON:  05/04/2018 FINDINGS: Scattered gas and stool in the colon. Gas-filled nondistended left upper quadrant small bowel. No dilated small bowel identified. Enteric tube tip is in the left upper quadrant consistent with location in the upper stomach. No radiopaque stones. Visualized bones and soft tissue contours appear intact. Calcified phleboliths in  the pelvis. IMPRESSION: Enteric tube tip is in the left upper quadrant consistent with location in the upper stomach. Gas-filled nondilated small bowel in the left upper quadrant. Electronically Signed   By: Lucienne Capers M.D.   On: 05/05/2018 06:54   Abd 1 View (kub)  Result  Date: 05/04/2018 CLINICAL DATA:  Abdominal pain and nausea. Vomiting. CT scan yesterday showed an inflamed terminal ileum and small bowel dilatation proximal to this point. EXAM: ABDOMEN - 1 VIEW COMPARISON:  CT abdomen 05/03/2018 FINDINGS: Mildly dilated loops of small bowel in the pelvis and right upper quadrant. Small amount of formed stool in the colon. No significant abnormal calcifications. IMPRESSION: 1. Mildly dilated loops of small bowel in the right upper quadrant and in the pelvis. This does appear mildly improved compared to yesterday's CT exam where the dilated small bowel loops were more striking. Electronically Signed   By: Van Clines M.D.   On: 05/04/2018 10:38    Medications: I have reviewed the patient's current medications.  Assessment: Small bowel obstruction with terminal ileal inflammation suspicious for inflammatory bowel disease, possible Crohn's Elevated ESR and CRP Abdominal x-ray from today shows gas filled but nondilated small bowel loops.  Plan: DC NG tube, start clear liquid diet, will be advanced as tolerated. Clinically improving. Plan to transition to PO steroids on discharge Plan an outpatient colonoscopy soon for tissue diagnosis/pathology. HBsAG negative and TB gold mitogen test pending- performed in anticipation of requiring biologics or immunosuppressants in the future.   Ronnette Juniper 05/05/2018, 9:55 AM   Pager [phone] If no answer or after 5 PM call [phone]

## 2018-05-06 DIAGNOSIS — K56609 Unspecified intestinal obstruction, unspecified as to partial versus complete obstruction: Secondary | ICD-10-CM

## 2018-05-06 DIAGNOSIS — G43A1 Cyclical vomiting, intractable: Secondary | ICD-10-CM

## 2018-05-06 DIAGNOSIS — J4520 Mild intermittent asthma, uncomplicated: Secondary | ICD-10-CM

## 2018-05-06 LAB — QUANTIFERON-TB GOLD PLUS: QuantiFERON-TB Gold Plus: NEGATIVE

## 2018-05-06 LAB — QUANTIFERON-TB GOLD PLUS (RQFGPL)
QUANTIFERON TB2 AG VALUE: 0.02 [IU]/mL
QuantiFERON Mitogen Value: 2.72 IU/mL
QuantiFERON Nil Value: 0.02 IU/mL
QuantiFERON TB1 Ag Value: 0.02 IU/mL

## 2018-05-06 MED ORDER — METRONIDAZOLE 500 MG PO TABS: 500.0000 mg | ORAL_TABLET | Freq: Three times a day (TID) | ORAL | 0 refills | Status: DC

## 2018-05-06 MED ORDER — CIPROFLOXACIN HCL 500 MG PO TABS: 500.0000 mg | ORAL_TABLET | Freq: Two times a day (BID) | ORAL | 0 refills | Status: AC

## 2018-05-06 MED ORDER — CIPROFLOXACIN HCL 500 MG PO TABS: 500.0000 mg | ORAL_TABLET | Freq: Two times a day (BID) | ORAL | 0 refills | Status: DC

## 2018-05-06 MED ORDER — PREDNISONE 10 MG PO TABS: ORAL_TABLET | ORAL | 0 refills | Status: DC

## 2018-05-06 MED ORDER — METRONIDAZOLE 500 MG PO TABS: 500.0000 mg | ORAL_TABLET | Freq: Three times a day (TID) | ORAL | 0 refills | Status: AC

## 2018-05-06 NOTE — Progress Notes (Signed)
Pt is discharged to go home.  Discharge instructions and prescription given.

## 2018-05-06 NOTE — Progress Notes (Signed)
Subjective: The patient was seen and examined at bedside. He has been able to tolerate solids without associated nausea, vomiting, abdominal pain or diarrhea. His last bowel movement was yesterday afternoon described as formed and without blood in it.  Objective: Vital signs in last 24 hours: Temp:  [98.1 F (36.7 C)-98.2 F (36.8 C)] 98.1 F (36.7 C) (09/06 0645) Pulse Rate:  [69-84] 69 (09/06 0645) Resp:  [18-20] 20 (09/06 0645) BP: (124-132)/(76-89) 132/89 (09/06 0645) SpO2:  [97 %-100 %] 100 % (09/06 0645) Weight change:  Last BM Date: 05/05/18  BP:PHKFEXM comfortable, sitting on bedside chair GENERAL: Thinly built,no pallor, no icterus ABDOMEN:soft, nondistended, nontender, normoactive bowel sounds EXTREMITIES:no edema no deformity  Lab Results: Results for orders placed or performed during the hospital encounter of 05/03/18 (from the past 48 hour(s))  T4, free     Status: Abnormal   Collection Time: 05/04/18 12:32 PM  Result Value Ref Range   Free T4 0.75 (L) 0.82 - 1.77 ng/dL    Comment: (NOTE) Biotin ingestion may interfere with free T4 tests. If the results are inconsistent with the TSH level, previous test results, or the clinical presentation, then consider biotin interference. If needed, order repeat testing after stopping biotin. Performed at [facility], [place] [address] [place], [place] [address]     Studies/Results: Dg Abd 1 View  Result Date: 05/05/2018 CLINICAL DATA:  Follow-up small bowel obstruction. NG tube present. Recent bowel movements. EXAM: ABDOMEN - 1 VIEW COMPARISON:  05/04/2018 FINDINGS: Scattered gas and stool in the colon. Gas-filled nondistended left upper quadrant small bowel. No dilated small bowel identified. Enteric tube tip is in the left upper quadrant consistent with location in the upper stomach. No radiopaque stones. Visualized bones and soft tissue contours appear intact. Calcified phleboliths in the pelvis. IMPRESSION: Enteric  tube tip is in the left upper quadrant consistent with location in the upper stomach. Gas-filled nondilated small bowel in the left upper quadrant. Electronically Signed   By: Lucienne Capers M.D.   On: 05/05/2018 06:54    Medications: I have reviewed the patient's current medications.  Assessment: Small bowel obstruction likely related to IBD/terminal ileum inflammation/possible Crohn's disease Resolved  Plan: Okay to discharge from GI standpoint.  Recommend prednisone 40 mg by mouth daily for one week, then 30 mg by mouth daily for one week, 20 mg daily for 1 week, and 10 mg daily for one week and then stop. Recommend finishing course of Cipro and Flagyl for a total of 10 days.  We will arrange for outpatient colonoscopy in the next 1-2 weeks. TB GOLD Quantitative test pending, Hb surface antigen negative-in anticipation of starting biologic/immunosuppressants as an outpatient.     Ronnette Juniper 05/06/2018, 11:47 AM   Pager [phone] If no answer or after 5 PM call [phone]

## 2018-05-06 NOTE — Discharge Summary (Addendum)
Physician Discharge Summary  Eric Castillo MRN: [id number] DOB/AGE: Dec 28, 1975 42 y.o.  PCP: Patient, No Pcp Per   Admit date: 05/03/2018 Discharge date: 05/06/2018  Discharge Diagnoses:    Principal Problem:   SBO (small bowel obstruction) ([place]) Active Problems:   Asthma    Follow-up recommendations Follow-up with PCP in 3-5 days , including all  additional recommended appointments as below Follow-up CBC, CMP in 3-5 days Recommend prednisone 40 mg by mouth daily for one week, then 30 mg by mouth daily for one week, 20 mg daily for 1 week, and 10 mg daily for one week and then stop. Recommend finishing course of Cipro and Flagyl for  Another 7 days. Patient will need outpatient colonoscopy with Dr. Ronnette Juniper     Allergies as of 05/06/2018   No Known Allergies     Medication List    TAKE these medications   albuterol 108 (90 Base) MCG/ACT inhaler Commonly known as:  PROVENTIL HFA;VENTOLIN HFA Inhale 1-2 puffs into the lungs every 6 (six) hours as needed for wheezing or shortness of breath.   ciprofloxacin 500 MG tablet Commonly known as:  CIPRO Take 1 tablet (500 mg total) by mouth 2 (two) times daily for 7 days.   metroNIDAZOLE 500 MG tablet Commonly known as:  FLAGYL Take 1 tablet (500 mg total) by mouth 3 (three) times daily for 7 days.   predniSONE 10 MG tablet Commonly known as:  DELTASONE 4 tablets 1 week, 3 tablets 1 week, 2 tablets 1 week, 1 tablet 1 week        Discharge Condition:  stable  Discharge Instructions Get Medicines reviewed and adjusted: Please take all your medications with you for your next visit with your Primary MD  Please request your Primary MD to go over all hospital tests and procedure/radiological results at the follow up, please ask your Primary MD to get all Hospital records sent to his/her office.  If you experience worsening of your admission symptoms, develop shortness of breath, life threatening emergency, suicidal or  homicidal thoughts you must seek medical attention immediately by calling 911 or calling your MD immediately  if symptoms less severe.  You must read complete instructions/literature along with all the possible adverse reactions/side effects for all the Medicines you take and that have been prescribed to you. Take any new Medicines after you have completely understood and accpet all the possible adverse reactions/side effects.   Do not drive when taking Pain medications.   Do not take more than prescribed Pain, Sleep and Anxiety Medications  Special Instructions: If you have smoked or chewed Tobacco  in the last 2 yrs please stop smoking, stop any regular Alcohol  and or any Recreational drug use.  Wear Seat belts while driving.  Please note  You were cared for by a hospitalist during your hospital stay. Once you are discharged, your primary care physician will handle any further medical issues. Please note that NO REFILLS for any discharge medications will be authorized once you are discharged, as it is imperative that you return to your primary care physician (or establish a relationship with a primary care physician if you do not have one) for your aftercare needs so that they can reassess your need for medications and monitor your lab values.     No Known Allergies    Disposition: Discharge disposition: 01-Home or Self Care        Consults: * gastroenterology    Significant Diagnostic Studies:  Dg Abd  1 View  Result Date: 05/05/2018 CLINICAL DATA:  Follow-up small bowel obstruction. NG tube present. Recent bowel movements. EXAM: ABDOMEN - 1 VIEW COMPARISON:  05/04/2018 FINDINGS: Scattered gas and stool in the colon. Gas-filled nondistended left upper quadrant small bowel. No dilated small bowel identified. Enteric tube tip is in the left upper quadrant consistent with location in the upper stomach. No radiopaque stones. Visualized bones and soft tissue contours appear  intact. Calcified phleboliths in the pelvis. IMPRESSION: Enteric tube tip is in the left upper quadrant consistent with location in the upper stomach. Gas-filled nondilated small bowel in the left upper quadrant. Electronically Signed   By: Lucienne Capers M.D.   On: 05/05/2018 06:54   Abd 1 View (kub)  Result Date: 05/04/2018 CLINICAL DATA:  Abdominal pain and nausea. Vomiting. CT scan yesterday showed an inflamed terminal ileum and small bowel dilatation proximal to this point. EXAM: ABDOMEN - 1 VIEW COMPARISON:  CT abdomen 05/03/2018 FINDINGS: Mildly dilated loops of small bowel in the pelvis and right upper quadrant. Small amount of formed stool in the colon. No significant abnormal calcifications. IMPRESSION: 1. Mildly dilated loops of small bowel in the right upper quadrant and in the pelvis. This does appear mildly improved compared to yesterday's CT exam where the dilated small bowel loops were more striking. Electronically Signed   By: Van Clines M.D.   On: 05/04/2018 10:38   Ct Abdomen Pelvis W Contrast  Result Date: 05/03/2018 CLINICAL DATA:  Upper abdominal pain and constipation for 3 days. Vomiting. Recent hospital admission for small-bowel obstruction. EXAM: CT ABDOMEN AND PELVIS WITH CONTRAST TECHNIQUE: Multidetector CT imaging of the abdomen and pelvis was performed using the standard protocol following bolus administration of intravenous contrast. CONTRAST:  100 mL ISOVUE-300 IOPAMIDOL (ISOVUE-300) INJECTION 61% COMPARISON:  None. FINDINGS: LOWER CHEST: Mild bronchial wall thickening and tiny tree-in-bud infiltrates most compatible with respiratory bronchiolitis. Lung bases are clear. Included heart size is normal. No pericardial effusion. HEPATOBILIARY: 19 mm homogeneously hypodense benign-appearing cyst RIGHT lobe of the liver. Subcentimeter probable flash filling hemangioma segment 4 of the liver. PANCREAS: Normal. SPLEEN: Normal. ADRENALS/URINARY TRACT: Kidneys are orthotopic,  demonstrating symmetric enhancement. Mild cortical thinning lower pole RIGHT kidney. No nephrolithiasis, hydronephrosis or solid renal masses. The unopacified ureters are normal in course and caliber. Delayed imaging through the kidneys demonstrates symmetric prompt contrast excretion within the proximal urinary collecting system. Urinary bladder is partially distended and unremarkable. Normal adrenal glands. STOMACH/BOWEL: Small bowel dilated to 4.2 cm with air-fluid levels, transition point at terminal ileum associated with bowel wall thickening and inflammatory changes. Decompressed colon. The appendix is not discretely identified. VASCULAR/LYMPHATIC: Aortoiliac vessels are normal in course and caliber. No lymphadenopathy by CT size criteria. REPRODUCTIVE: Normal. OTHER: Small amount of free fluid in the pelvis. No intraperitoneal free air or focal fluid collections. MUSCULOSKELETAL: Nonacute. IMPRESSION: 1. Small-bowel obstruction with transition point at inflamed terminal ileum concerning for inflammatory bowel disease. 2. Mild suspected respiratory bronchiolitis with bronchitic changes. Electronically Signed   By: Elon Alas M.D.   On: 05/03/2018 06:26        Filed Weights   05/03/18 1330  Weight: 62.4 kg     Microbiology: No results found for this or any previous visit (from the past 240 hour(s)).     Blood Culture No results found for: SDES, SPECREQUEST, CULT, REPTSTATUS    Labs: No results found for this or any previous visit (from the past 48 hour(s)).   Lipid Panel  No  results found for: CHOL, TRIG, HDL, CHOLHDL, VLDL, LDLCALC, LDLDIRECT   Lab Results  Component Value Date   HGBA1C 6.0 (H) 05/03/2018     Lab Results  Component Value Date   CREATININE 0.89 05/04/2018     HPI :  Eric Castillo is a 42 y.o. male presented to the ER with complaints of abdominal pain. 3 weeks ago when he was in [place], he developed nausea along with upper abdominal  pain and was admitted for a week with small bowel obstruction. Patient was being planned for a colonoscopy but was not able to tolerate the prep. He was subsequently discharged on steroids along with ciprofloxacin and Flagyl. He did okay for about a week, however since the last 5 days has recurrence of symptoms, which he describes as upper abdominal pain, bloating, belching. Patient has not had a bowel movement for the last 4-5 days, denies passage of flatus. His mother had Crohn's disease, and passed away 3 years ago, needed surgical intervention as well as long-term management for Crohn's disease. His father had colon cancer in his 46s. The patient has never had a colonoscopy in the past. Denies rectal bleeding, black stools, loss of appetite or unintentional weight loss recently. He is a smoker and smokes about 4 cigarettes a day since the age of 14. He denies oral ulcers, joint pain or skin rashes. Patient denies acid reflux, heartburn, difficulty swallowing or pain on swallowing.  HOSPITAL COURSE:   SBO (small bowel obstruction)  Related to IBD/terminal ileum Suspect due to crohn'sstricture, no prior hx of abdominal surgeries Patient treated with iv solumedrol , iv cipro, flagyl,[facility] consulted , Dr Therisa Doyne , abdominal KUB shows mild improvement NG tube discontinued, GI has advance patient's diet to clear liquids>solids if tolerated  Patient advised to continue with prednisone taper, Cipro and Flagyl for  Total of 10 days Further workup as per gastroenterology-ESR  64, CRP 66 He needs to follow-up with gastroenterology for an outpatient colonoscopy to confirm IBD   Asthmastable, without exacerbation stable from a pulmonary standpoint Changes on x-ray appeared to be chronic, patient is asymptomatic    Discharge Exam:  Blood pressure 132/89, pulse 69, temperature 98.1 F (36.7 C), temperature source Oral, resp. rate 20, height 6' (1.829 m), weight 62.4 kg, SpO2 100  %.   Cardiovascular system: S1 & S2 heard, RRR. No JVD, murmurs, rubs, gallops or clicks. No pedal edema. Gastrointestinal system: Abdomen is nondistended, soft and nontender. No organomegaly or masses felt. Normal bowel sounds heard. Central nervous system: Alert and oriented. No focal neurological deficits. Extremities: Symmetric 5 x 5 power. Skin: No rashes, lesions or ulcers Psychiatry: Judgement and insight appear normal. Mood & affect appropriate.   Follow-up Information    [facility]. Schedule an appointment as soon as possible for a visit.   Contact information: [address] [place] [address] [phone]          Signed: Reyne Dumas 05/06/2018, 1:33 PM      Time needed to  prepare  discharge, discussed with the patient and family 35 minutes

## 2018-05-06 NOTE — Care Management Note (Signed)
Case Management Note  Patient Details  Name: Ismail Graziani MRN: [id number] Date of Birth: 02-Feb-1976  Subjective/Objective:                    Action/Plan:   Expected Discharge Date:                  Expected Discharge Plan:  Home/Self Care  In-House Referral:  Financial Counselor  Discharge planning Services  CM Consult, Medication Assistance, [place], [facility]  Post Acute Care Choice:  NA Choice offered to:  Patient  DME Arranged:  N/A DME Agency:  NA  HH Arranged:  NA HH Agency:  NA  Status of Service:  Completed, signed off  If discussed at [place] of Stay Meetings, dates discussed:    Additional Comments:  Marilu Favre, RN 05/06/2018, 12:50 PM

## 2018-08-08 ENCOUNTER — Telehealth (HOSPITAL_COMMUNITY): Payer: Self-pay

## 2018-08-08 NOTE — Telephone Encounter (Signed)
Created in error

## 2018-12-20 ENCOUNTER — Emergency Department (HOSPITAL_COMMUNITY): Payer: Self-pay

## 2018-12-20 ENCOUNTER — Encounter (HOSPITAL_COMMUNITY): Payer: Self-pay | Admitting: Emergency Medicine

## 2018-12-20 ENCOUNTER — Other Ambulatory Visit: Payer: Self-pay

## 2018-12-20 ENCOUNTER — Inpatient Hospital Stay (HOSPITAL_COMMUNITY)
Admission: EM | Admit: 2018-12-20 | Discharge: 2018-12-22 | DRG: 387 | Disposition: A | Discharge status: Home / Self Care | Payer: Self-pay | Attending: Family Medicine | Admitting: Family Medicine

## 2018-12-20 DIAGNOSIS — F17210 Nicotine dependence, cigarettes, uncomplicated: Secondary | ICD-10-CM | POA: Diagnosis present

## 2018-12-20 DIAGNOSIS — E876 Hypokalemia: Secondary | ICD-10-CM | POA: Diagnosis present

## 2018-12-20 DIAGNOSIS — K56609 Unspecified intestinal obstruction, unspecified as to partial versus complete obstruction: Secondary | ICD-10-CM | POA: Diagnosis present

## 2018-12-20 DIAGNOSIS — K50012 Crohn's disease of small intestine with intestinal obstruction: Principal | ICD-10-CM | POA: Diagnosis present

## 2018-12-20 DIAGNOSIS — Z800 Family history of malignant neoplasm of digestive organs: Secondary | ICD-10-CM

## 2018-12-20 DIAGNOSIS — Z79899 Other long term (current) drug therapy: Secondary | ICD-10-CM

## 2018-12-20 DIAGNOSIS — J4520 Mild intermittent asthma, uncomplicated: Secondary | ICD-10-CM | POA: Diagnosis present

## 2018-12-20 HISTORY — DX: Crohn's disease, unspecified, without complications: K50.90

## 2018-12-20 LAB — CBC WITH DIFFERENTIAL/PLATELET
Abs Immature Granulocytes: 0.03 10*3/uL (ref 0.00–0.07)
Basophils Absolute: 0 10*3/uL (ref 0.0–0.1)
Basophils Relative: 0 %
Eosinophils Absolute: 1 10*3/uL — ABNORMAL HIGH (ref 0.0–0.5)
Eosinophils Relative: 10 %
HCT: 35.6 % — ABNORMAL LOW (ref 39.0–52.0)
Hemoglobin: 11.3 g/dL — ABNORMAL LOW (ref 13.0–17.0)
Immature Granulocytes: 0 %
Lymphocytes Relative: 11 %
Lymphs Abs: 1 10*3/uL (ref 0.7–4.0)
MCH: 27.6 pg (ref 26.0–34.0)
MCHC: 31.7 g/dL (ref 30.0–36.0)
MCV: 86.8 fL (ref 80.0–100.0)
Monocytes Absolute: 1.5 10*3/uL — ABNORMAL HIGH (ref 0.1–1.0)
Monocytes Relative: 16 %
Neutro Abs: 5.6 10*3/uL (ref 1.7–7.7)
Neutrophils Relative %: 63 %
Platelets: 355 10*3/uL (ref 150–400)
RBC: 4.1 MIL/uL — ABNORMAL LOW (ref 4.22–5.81)
RDW: 15.6 % — ABNORMAL HIGH (ref 11.5–15.5)
WBC: 9.2 10*3/uL (ref 4.0–10.5)
nRBC: 0 % (ref 0.0–0.2)

## 2018-12-20 LAB — URINALYSIS, ROUTINE W REFLEX MICROSCOPIC
Bacteria, UA: NONE SEEN
Bilirubin Urine: NEGATIVE
Glucose, UA: NEGATIVE mg/dL
Hgb urine dipstick: NEGATIVE
Ketones, ur: 5 mg/dL — AB
Nitrite: NEGATIVE
Protein, ur: NEGATIVE mg/dL
Specific Gravity, Urine: 1.03 (ref 1.005–1.030)
pH: 5 (ref 5.0–8.0)

## 2018-12-20 LAB — COMPREHENSIVE METABOLIC PANEL
ALT: 11 U/L (ref 0–44)
AST: 12 U/L — ABNORMAL LOW (ref 15–41)
Albumin: 2.6 g/dL — ABNORMAL LOW (ref 3.5–5.0)
Alkaline Phosphatase: 55 U/L (ref 38–126)
Anion gap: 8 (ref 5–15)
BUN: 8 mg/dL (ref 6–20)
CO2: 26 mmol/L (ref 22–32)
Calcium: 8.7 mg/dL — ABNORMAL LOW (ref 8.9–10.3)
Chloride: 106 mmol/L (ref 98–111)
Creatinine, Ser: 0.89 mg/dL (ref 0.61–1.24)
GFR calc Af Amer: >60 mL/min (ref >60)
GFR calc non Af Amer: >60 mL/min (ref >60)
Glucose, Bld: 99 mg/dL (ref 70–99)
Potassium: 3.3 mmol/L — ABNORMAL LOW (ref 3.5–5.1)
Sodium: 140 mmol/L (ref 135–145)
Total Bilirubin: 0.5 mg/dL (ref 0.3–1.2)
Total Protein: 5.9 g/dL — ABNORMAL LOW (ref 6.5–8.1)

## 2018-12-20 LAB — LIPASE, BLOOD: Lipase: 33 U/L (ref 11–51)

## 2018-12-20 MED ORDER — SODIUM CHLORIDE 0.9 % IV BOLUS
1000.0000 mL | Freq: Once | INTRAVENOUS | Status: AC
  Administered 2018-12-20: 1000 mL via INTRAVENOUS

## 2018-12-20 MED ORDER — MORPHINE SULFATE (PF) 4 MG/ML IV SOLN
4.0000 mg | Freq: Once | INTRAVENOUS | Status: AC
  Administered 2018-12-20: 4 mg via INTRAVENOUS
  Filled 2018-12-20: qty 1

## 2018-12-20 MED ORDER — IOHEXOL 300 MG/ML  SOLN
100.0000 mL | Freq: Once | INTRAMUSCULAR | Status: AC | PRN
  Administered 2018-12-20: 100 mL via INTRAVENOUS

## 2018-12-20 NOTE — ED Notes (Signed)
ED Provider at bedside. 

## 2018-12-20 NOTE — ED Triage Notes (Addendum)
Patient with abdominal pain that started three days ago.  Patient does have some nausea, no vomiting.  Patient states that is all over abdomin, he has been here before for this and thinks he may have chron's disease.  No diarrhea.  Patient has had SBO before and this feels like that. Last BM was 3 days ago.

## 2018-12-20 NOTE — ED Provider Notes (Signed)
[facility] EMERGENCY DEPARTMENT Provider Note   CSN: [id number] Arrival date & time: 12/20/18  2022    History   Chief Complaint Chief Complaint  Patient presents with   Abdominal Pain    HPI Eric Castillo. is a 43 y.o. male.     HPI Patient presents to the emergency department with 3-day history of abdominal discomfort.  Patient states that he is had some nausea but no vomiting.  Patient states he is also had decreased bowel movements during this timeframe.  Patient states that nothing seems to make the condition better but palpation makes the pain worse.  Patient states he has not had any bowel movements over the last 3 to 4 days.  Patient states he feels like he needs to go but cannot have any bowel movement.  Patient states that he has been getting more uncomfortable as time goes on.  The patient denies chest pain, shortness of breath, headache,blurred vision, neck pain, fever, cough, weakness, numbness, dizziness, anorexia, edema,  vomiting, diarrhea, rash, back pain, dysuria, hematemesis, bloody stool, near syncope, or syncope. Past Medical History:  Diagnosis Date   Asthma    SBO (small bowel obstruction) [facility])     Patient Active Problem List   Diagnosis Date Noted   SBO (small bowel obstruction) ([place]) 05/03/2018   Asthma 05/03/2018    History reviewed. No pertinent surgical history.      Home Medications    Prior to Admission medications   Medication Sig Start Date End Date Taking? Authorizing Provider  albuterol (PROVENTIL HFA;VENTOLIN HFA) 108 (90 Base) MCG/ACT inhaler Inhale 1-2 puffs into the lungs every 6 (six) hours as needed for wheezing or shortness of breath.    [provider]  predniSONE (DELTASONE) 10 MG tablet 4 tablets 1 week, 3 tablets 1 week, 2 tablets 1 week, 1 tablet 1 week 05/06/18   Reyne Dumas, MD    Family History No family history on file.  Social History Social History   Tobacco Use   Smoking  status: Current Every Day Smoker   Smokeless tobacco: Never Used  Substance Use Topics   Alcohol use: Yes   Drug use: Yes    Types: Marijuana     Allergies   Patient has no known allergies.   Review of Systems Review of Systems All other systems negative except as documented in the HPI. All pertinent positives and negatives as reviewed in the HPI.  Physical Exam Updated Vital Signs BP 120/72    Pulse 67    Temp 98.8 F (37.1 C) (Oral)    Resp 14    SpO2 99%   Physical Exam Vitals signs and nursing note reviewed.  Constitutional:      General: He is not in acute distress.    Appearance: He is well-developed.  HENT:     Head: Normocephalic and atraumatic.  Eyes:     Pupils: Pupils are equal, round, and reactive to light.  Neck:     Musculoskeletal: Normal range of motion and neck supple.  Cardiovascular:     Rate and Rhythm: Normal rate and regular rhythm.     Heart sounds: Normal heart sounds. No murmur. No friction rub. No gallop.   Pulmonary:     Effort: Pulmonary effort is normal. No respiratory distress.     Breath sounds: Normal breath sounds. No wheezing.  Abdominal:     General: Bowel sounds are normal. There is no distension.     Palpations: Abdomen is  soft.     Tenderness: There is generalized abdominal tenderness. There is guarding. There is no rebound.  Skin:    General: Skin is warm and dry.     Capillary Refill: Capillary refill takes less than 2 seconds.     Findings: No erythema or rash.  Neurological:     Mental Status: He is alert and oriented to person, place, and time.     Motor: No abnormal muscle tone.     Coordination: Coordination normal.  Psychiatric:        Behavior: Behavior normal.      ED Treatments / Results  Labs (all labs ordered are listed, but only abnormal results are displayed) Labs Reviewed  COMPREHENSIVE METABOLIC PANEL - Abnormal; Notable for the following components:      Result Value   Potassium 3.3 (*)     Calcium 8.7 (*)    Total Protein 5.9 (*)    Albumin 2.6 (*)    AST 12 (*)    All other components within normal limits  CBC WITH DIFFERENTIAL/PLATELET - Abnormal; Notable for the following components:   RBC 4.10 (*)    Hemoglobin 11.3 (*)    HCT 35.6 (*)    RDW 15.6 (*)    Monocytes Absolute 1.5 (*)    Eosinophils Absolute 1.0 (*)    All other components within normal limits  URINALYSIS, ROUTINE W REFLEX MICROSCOPIC - Abnormal; Notable for the following components:   Ketones, ur 5 (*)    Leukocytes,Ua TRACE (*)    All other components within normal limits  LIPASE, BLOOD    EKG None  Radiology Ct Abdomen Pelvis W Contrast  Result Date: 12/20/2018 CLINICAL DATA:  Abdominal distention EXAM: CT ABDOMEN AND PELVIS WITH CONTRAST TECHNIQUE: Multidetector CT imaging of the abdomen and pelvis was performed using the standard protocol following bolus administration of intravenous contrast. CONTRAST:  149m OMNIPAQUE IOHEXOL 300 MG/ML  SOLN COMPARISON:  05/03/2018 FINDINGS: Lower chest: Dependent atelectasis.  No acute abnormality. Hepatobiliary: 2.1 cm cyst in the right hepatic lobe. No suspicious attic abnormality. Gallbladder is contracted, grossly unremarkable. Pancreas: No focal abnormality or ductal dilatation. Spleen: No focal abnormality.  Normal size. Adrenals/Urinary Tract: No adrenal abnormality. No focal renal abnormality. No stones or hydronephrosis. Urinary bladder is unremarkable. Stomach/Bowel: There is abnormal wall thickening and surrounding inflammation involving the terminal ileum concerning for infectious or inflammatory enteritis. Crohn's disease not excluded. Small bowel proximal to this is mildly dilated suggesting a component of obstruction. Appendix immediately adjacent to the terminal ileum appears normal. Stomach and large bowel grossly unremarkable. Vascular/Lymphatic: No evidence of aneurysm or adenopathy. Reproductive: No visible focal abnormality. Other: No free fluid  or free air. Musculoskeletal: No acute bony abnormality. IMPRESSION: Abnormal wall thickening and surrounding inflammation involving the terminal ileum concerning for infectious or inflammatory enteritis. Inflammatory bowel disease not excluded. This appears to cause some degree of bowel obstruction with mildly dilated fluid and stool filled small bowel loops proximal to the terminal ileum. Electronically Signed   By: KRolm BaptiseM.D.   On: 12/20/2018 22:56    Procedures Procedures (including critical care time)  Medications Ordered in ED Medications  sodium chloride 0.9 % bolus 1,000 mL (1,000 mLs Intravenous New Bag/Given 12/20/18 2139)  iohexol (OMNIPAQUE) 300 MG/ML solution 100 mL (100 mLs Intravenous Contrast Given 12/20/18 2230)  morphine 4 MG/ML injection 4 mg (4 mg Intravenous Given 12/20/18 2244)     Initial Impression / Assessment and Plan / ED Course  I have reviewed the triage vital signs and the nursing notes.  Pertinent labs & imaging results that were available during my care of the patient were reviewed by me and considered in my medical decision making (see chart for details).        Feel that the patient needs admission due to the fact is not having any bowel movements in this area is causing obstruction of the bowel in that region.  I think that he would benefit from seeing GI as well.  Patient does have stable vital signs laboratory testing is fairly benign but the CT scan does show concern.  Final Clinical Impressions(s) / ED Diagnoses   Final diagnoses:  None    ED Discharge Orders    None       Dalia Heading, PA-C 12/20/18 2319    Maudie Flakes, MD 12/21/18 [phone]

## 2018-12-21 ENCOUNTER — Other Ambulatory Visit: Payer: Self-pay

## 2018-12-21 ENCOUNTER — Encounter (HOSPITAL_COMMUNITY): Payer: Self-pay | Admitting: *Deleted

## 2018-12-21 DIAGNOSIS — Z800 Family history of malignant neoplasm of digestive organs: Secondary | ICD-10-CM

## 2018-12-21 DIAGNOSIS — Z720 Tobacco use: Secondary | ICD-10-CM | POA: Insufficient documentation

## 2018-12-21 DIAGNOSIS — K50012 Crohn's disease of small intestine with intestinal obstruction: Secondary | ICD-10-CM | POA: Diagnosis present

## 2018-12-21 DIAGNOSIS — J4520 Mild intermittent asthma, uncomplicated: Secondary | ICD-10-CM

## 2018-12-21 DIAGNOSIS — K56609 Unspecified intestinal obstruction, unspecified as to partial versus complete obstruction: Secondary | ICD-10-CM

## 2018-12-21 LAB — BASIC METABOLIC PANEL
Anion gap: 9 (ref 5–15)
BUN: 7 mg/dL (ref 6–20)
CO2: 24 mmol/L (ref 22–32)
Calcium: 8.4 mg/dL — ABNORMAL LOW (ref 8.9–10.3)
Chloride: 108 mmol/L (ref 98–111)
Creatinine, Ser: 0.92 mg/dL (ref 0.61–1.24)
GFR calc Af Amer: >60 mL/min (ref >60)
GFR calc non Af Amer: >60 mL/min (ref >60)
Glucose, Bld: 90 mg/dL (ref 70–99)
Potassium: 3.8 mmol/L (ref 3.5–5.1)
Sodium: 141 mmol/L (ref 135–145)

## 2018-12-21 LAB — CBC
HCT: 34.9 % — ABNORMAL LOW (ref 39.0–52.0)
Hemoglobin: 11.3 g/dL — ABNORMAL LOW (ref 13.0–17.0)
MCH: 28 pg (ref 26.0–34.0)
MCHC: 32.4 g/dL (ref 30.0–36.0)
MCV: 86.4 fL (ref 80.0–100.0)
Platelets: 380 10*3/uL (ref 150–400)
RBC: 4.04 MIL/uL — ABNORMAL LOW (ref 4.22–5.81)
RDW: 15.8 % — ABNORMAL HIGH (ref 11.5–15.5)
WBC: 9.9 10*3/uL (ref 4.0–10.5)
nRBC: 0 % (ref 0.0–0.2)

## 2018-12-21 MED ORDER — BOOST / RESOURCE BREEZE PO LIQD CUSTOM
1.0000 | Freq: Two times a day (BID) | ORAL | Status: DC
  Administered 2018-12-21 – 2018-12-22 (×2): 1 via ORAL

## 2018-12-21 MED ORDER — MORPHINE SULFATE (PF) 4 MG/ML IV SOLN
4.0000 mg | INTRAVENOUS | Status: DC | PRN
  Administered 2018-12-21 – 2018-12-22 (×4): 4 mg via INTRAVENOUS
  Filled 2018-12-21 (×4): qty 1

## 2018-12-21 MED ORDER — PREDNISONE 20 MG PO TABS
40.0000 mg | ORAL_TABLET | Freq: Every day | ORAL | Status: DC
  Administered 2018-12-21 – 2018-12-22 (×2): 40 mg via ORAL
  Filled 2018-12-21 (×2): qty 2

## 2018-12-21 MED ORDER — METHYLPREDNISOLONE SODIUM SUCC 125 MG IJ SOLR
60.0000 mg | Freq: Once | INTRAMUSCULAR | Status: AC
  Administered 2018-12-21: 13:00:00 60 mg via INTRAVENOUS
  Filled 2018-12-21: qty 2

## 2018-12-21 MED ORDER — POTASSIUM CHLORIDE 10 MEQ/100ML IV SOLN
10.0000 meq | INTRAVENOUS | Status: AC
  Administered 2018-12-21 (×3): 10 meq via INTRAVENOUS
  Filled 2018-12-21 (×3): qty 100

## 2018-12-21 MED ORDER — ONDANSETRON HCL 4 MG/2ML IJ SOLN: 4.0000 mg | Freq: Three times a day (TID) | INTRAMUSCULAR | Status: DC | PRN

## 2018-12-21 MED ORDER — ENOXAPARIN SODIUM 40 MG/0.4ML ~~LOC~~ SOLN
40.0000 mg | SUBCUTANEOUS | Status: DC
  Administered 2018-12-21: 40 mg via SUBCUTANEOUS
  Filled 2018-12-21 (×2): qty 0.4

## 2018-12-21 MED ORDER — SODIUM CHLORIDE 0.9 % IV SOLN
INTRAVENOUS | Status: DC
  Administered 2018-12-21 – 2018-12-22 (×3): via INTRAVENOUS

## 2018-12-21 NOTE — Progress Notes (Signed)
FPTS Interim Progress Note  S: Patient continues to be without nausea or vomiting.  Says he has not passed any flatulence.  Denies abdominal pain at present.  O: BP 121/80 (BP Location: Right Arm)   Pulse 68   Temp 98.2 F (36.8 C) (Oral)   Resp 16   SpO2 99%   General: well nourished, well developed, NAD with non-toxic appearance HEENT: normocephalic, atraumatic, moist mucous membranes Cardiovascular: regular rate and rhythm without murmurs, rubs, or gallops Lungs: clear to auscultation bilaterally with normal work of breathing Abdomen: soft, non-tender, non-distended, normoactive bowel sounds Skin: warm, dry, no rashes or lesions, cap refill < 2 seconds Extremities: warm and well perfused, normal tone, no edema  A/P: Partial SBO due to Crohn's colitis.  General surgery recommending GI consult.  GI to begin steroids and ADAT with plans for eventual colonoscopy.  Hopeful to avoid NG tube.  [place] Bing, DO 12/21/2018, 12:00 PM PGY-3, [facility] Service pager: [phone]

## 2018-12-21 NOTE — ED Notes (Signed)
ED TO INPATIENT HANDOFF REPORT  ED Nurse Name and Phone #:   Freida Busman  [phone]  S Name/Age/Gender Eric Castillo. 43 y.o. male Room/Bed: 016C/016C  Code Status   Code Status: Full Code  Home/SNF/Other Home Patient oriented to: self, place, time and situation Is this baseline? Yes   Triage Complete: Triage complete  Chief Complaint ABD Pain   Triage Note Patient with abdominal pain that started three days ago.  Patient does have some nausea, no vomiting.  Patient states that is all over abdomin, he has been here before for this and thinks he may have chron's disease.  No diarrhea.  Patient has had SBO before and this feels like that. Last BM was 3 days ago.   Allergies No Known Allergies  Level of Care/Admitting Diagnosis ED Disposition    ED Disposition Condition [facility] Area: [place] [[id number]]  Level of Care: Med-Surg [16]  Covid Evaluation: N/A  Diagnosis: SBO (small bowel obstruction) [facility]) [[id number]]  Admitting Physician: Kathrene Alu [[id number]]  Attending Physician: Leeanne Rio [phone]  Estimated length of stay: past midnight tomorrow  Certification:: I certify this patient will need inpatient services for at least 2 midnights  PT Class (Do Not Modify): Inpatient [101]  PT Acc Code (Do Not Modify): Private [1]       B Medical/Surgery History Past Medical History:  Diagnosis Date  . Asthma   . SBO (small bowel obstruction) ([place])    History reviewed. No pertinent surgical history.   A IV Location/Drains/Wounds Patient Lines/Drains/Airways Status   Active Line/Drains/Airways    Name:   Placement date:   Placement time:   Site:   Days:   Peripheral IV 12/20/18 Right Forearm   12/20/18    2129    Forearm   1          Intake/Output Last 24 hours  Intake/Output Summary (Last 24 hours) at 12/21/2018 0031 Last data filed at 12/20/2018 2338 Gross per 24 hour  Intake 2000 ml  Output -  Net 2000 ml     Labs/Imaging Results for orders placed or performed during the hospital encounter of 12/20/18 (from the past 48 hour(s))  Comprehensive metabolic panel     Status: Abnormal   Collection Time: 12/20/18  9:33 PM  Result Value Ref Range   Sodium 140 135 - 145 mmol/L   Potassium 3.3 (L) 3.5 - 5.1 mmol/L   Chloride 106 98 - 111 mmol/L   CO2 26 22 - 32 mmol/L   Glucose, Bld 99 70 - 99 mg/dL   BUN 8 6 - 20 mg/dL   Creatinine, Ser 0.89 0.61 - 1.24 mg/dL   Calcium 8.7 (L) 8.9 - 10.3 mg/dL   Total Protein 5.9 (L) 6.5 - 8.1 g/dL   Albumin 2.6 (L) 3.5 - 5.0 g/dL   AST 12 (L) 15 - 41 U/L   ALT 11 0 - 44 U/L   Alkaline Phosphatase 55 38 - 126 U/L   Total Bilirubin 0.5 0.3 - 1.2 mg/dL   GFR calc non Af Amer >60 >60 mL/min   GFR calc Af Amer >60 >60 mL/min   Anion gap 8 5 - 15    Comment: Performed at [facility], [address] [place], [place] [address]  CBC with Differential     Status: Abnormal   Collection Time: 12/20/18  9:33 PM  Result Value Ref Range   WBC 9.2 4.0 - 10.5 K/uL  RBC 4.10 (L) 4.22 - 5.81 MIL/uL   Hemoglobin 11.3 (L) 13.0 - 17.0 g/dL   HCT 35.6 (L) 39.0 - 52.0 %   MCV 86.8 80.0 - 100.0 fL   MCH 27.6 26.0 - 34.0 pg   MCHC 31.7 30.0 - 36.0 g/dL   RDW 15.6 (H) 11.5 - 15.5 %   Platelets 355 150 - 400 K/uL   nRBC 0.0 0.0 - 0.2 %   Neutrophils Relative % 63 %   Neutro Abs 5.6 1.7 - 7.7 K/uL   Lymphocytes Relative 11 %   Lymphs Abs 1.0 0.7 - 4.0 K/uL   Monocytes Relative 16 %   Monocytes Absolute 1.5 (H) 0.1 - 1.0 K/uL   Eosinophils Relative 10 %   Eosinophils Absolute 1.0 (H) 0.0 - 0.5 K/uL   Basophils Relative 0 %   Basophils Absolute 0.0 0.0 - 0.1 K/uL   Immature Granulocytes 0 %   Abs Immature Granulocytes 0.03 0.00 - 0.07 K/uL    Comment: Performed at [facility] [address] [place], [place] [address]  Lipase, blood     Status: None   Collection Time: 12/20/18  9:33 PM  Result Value Ref Range   Lipase 33 11 - 51 U/L    Comment:  Performed at [facility] [address] [place], [place] [address]  Urinalysis, Routine w reflex microscopic     Status: Abnormal   Collection Time: 12/20/18  9:34 PM  Result Value Ref Range   Color, Urine YELLOW YELLOW   APPearance CLEAR CLEAR   Specific Gravity, Urine 1.030 1.005 - 1.030   pH 5.0 5.0 - 8.0   Glucose, UA NEGATIVE NEGATIVE mg/dL   Hgb urine dipstick NEGATIVE NEGATIVE   Bilirubin Urine NEGATIVE NEGATIVE   Ketones, ur 5 (A) NEGATIVE mg/dL   Protein, ur NEGATIVE NEGATIVE mg/dL   Nitrite NEGATIVE NEGATIVE   Leukocytes,Ua TRACE (A) NEGATIVE   RBC / HPF 0-5 0 - 5 RBC/hpf   WBC, UA 6-10 0 - 5 WBC/hpf   Bacteria, UA NONE SEEN NONE SEEN   Squamous Epithelial / LPF 0-5 0 - 5   Mucus PRESENT    Ca Oxalate Crys, UA PRESENT     Comment: Performed at [facility], [address], [place], [place] [address]   Ct Abdomen Pelvis W Contrast  Result Date: 12/20/2018 CLINICAL DATA:  Abdominal distention EXAM: CT ABDOMEN AND PELVIS WITH CONTRAST TECHNIQUE: Multidetector CT imaging of the abdomen and pelvis was performed using the standard protocol following bolus administration of intravenous contrast. CONTRAST:  193m OMNIPAQUE IOHEXOL 300 MG/ML  SOLN COMPARISON:  05/03/2018 FINDINGS: Lower chest: Dependent atelectasis.  No acute abnormality. Hepatobiliary: 2.1 cm cyst in the right hepatic lobe. No suspicious attic abnormality. Gallbladder is contracted, grossly unremarkable. Pancreas: No focal abnormality or ductal dilatation. Spleen: No focal abnormality.  Normal size. Adrenals/Urinary Tract: No adrenal abnormality. No focal renal abnormality. No stones or hydronephrosis. Urinary bladder is unremarkable. Stomach/Bowel: There is abnormal wall thickening and surrounding inflammation involving the terminal ileum concerning for infectious or inflammatory enteritis. Crohn's disease not excluded. Small bowel proximal to this is mildly dilated suggesting a component of obstruction.  Appendix immediately adjacent to the terminal ileum appears normal. Stomach and large bowel grossly unremarkable. Vascular/Lymphatic: No evidence of aneurysm or adenopathy. Reproductive: No visible focal abnormality. Other: No free fluid or free air. Musculoskeletal: No acute bony abnormality. IMPRESSION: Abnormal wall thickening and surrounding inflammation involving the terminal ileum concerning  for infectious or inflammatory enteritis. Inflammatory bowel disease not excluded. This appears to cause some degree of bowel obstruction with mildly dilated fluid and stool filled small bowel loops proximal to the terminal ileum. Electronically Signed   By: Rolm Baptise M.D.   On: 12/20/2018 22:56    Pending Labs Unresulted Labs (From admission, onward)    Start     Ordered   12/21/18 1700  Basic metabolic panel  Tomorrow morning,   R     12/21/18 0004   12/21/18 0500  CBC  Tomorrow morning,   R     12/21/18 0004          Vitals/Pain Today's Vitals   12/20/18 2330 12/20/18 2341 12/20/18 2345 12/20/18 2347  BP: (!) 149/93  (!) 146/95   Pulse:      Resp: 19  16   Temp:      TempSrc:      SpO2:      PainSc:  8   8     Isolation Precautions No active isolations  Medications Medications  enoxaparin (LOVENOX) injection 40 mg (has no administration in time range)  0.9 %  sodium chloride infusion (has no administration in time range)  ondansetron (ZOFRAN) injection 4 mg (has no administration in time range)  sodium chloride 0.9 % bolus 1,000 mL (0 mLs Intravenous Stopped 12/20/18 2338)  iohexol (OMNIPAQUE) 300 MG/ML solution 100 mL (100 mLs Intravenous Contrast Given 12/20/18 2230)  morphine 4 MG/ML injection 4 mg (4 mg Intravenous Given 12/20/18 2244)    Mobility walks Low fall risk   Focused Assessments ED Gastrointestinal   R Recommendations: See Admitting Provider Note  Report given to:   Additional Notes:  Pt still reports significant pain (8/10) but appears in NAD. Nausea  improved. No episodes of emesis while in ED.

## 2018-12-21 NOTE — TOC Initial Note (Signed)
Transition of Care (TOC) - Initial/Assessment Note    Patient Details  Name: Eric Castillo. MRN: [id number] Date of Birth: Feb 03, 1976  Transition [facility]) CM/SW Contact:    Marilu Favre, RN Phone Number: 12/21/2018, 11:45 AM  Clinical Narrative:                  Patient from home with wife and father. Patient currently uninsured. Patient is positive his health insurance will start by Dec 30, 2018. He currently does not have PCP, provided information on [facility] and offered to schedule appointment. Patient declined and wants to wait until his insurance is active Dec 30, 2018 and then he will pick a PCP.   Explained NCM may be able to help with prescriptions at discharge . Patient  voiced understanding. MD please send Prescriptions to [facility] pharmacy Thanks  Expected Discharge Plan: Home/Self Care Barriers to Discharge: Continued Medical Work up   Patient Goals and CMS Choice Patient states their goals for this hospitalization and ongoing recovery are:: to go home  CMS Medicare.gov Compare Post Acute Care list provided to:: Patient Choice offered to / list presented to : NA  Expected Discharge Plan and Services Expected Discharge Plan: Home/Self Care In-house Referral: Financial Counselor Discharge Planning Services: CM Consult, [place] Program, Medication Assistance   Living arrangements for the past 2 months: Single Family Home Expected Discharge Date: 12/24/18               DME Arranged: N/A DME Agency: NA HH Arranged: NA HH Agency: NA  Prior Living Arrangements/Services Living arrangements for the past 2 months: Single Family Home Lives with:: Parents, Spouse Patient language and need for interpreter reviewed:: Yes Do you feel safe going back to the place where you live?: Yes      Need for Family Participation in Patient Care: No (Comment) Care giver support system in place?: No (comment)   Criminal Activity/Legal Involvement Pertinent to  Current Situation/Hospitalization: No - Comment as needed  Activities of Daily Living Home Assistive Devices/Equipment: None ADL Screening (condition at time of admission) Patient's cognitive ability adequate to safely complete daily activities?: Yes Is the patient deaf or have difficulty hearing?: No Does the patient have difficulty seeing, even when wearing glasses/contacts?: No Does the patient have difficulty concentrating, remembering, or making decisions?: No Patient able to express need for assistance with ADLs?: Yes Does the patient have difficulty dressing or bathing?: No Independently performs ADLs?: Yes (appropriate for developmental age) Does the patient have difficulty walking or climbing stairs?: No Weakness of Legs: None Weakness of Arms/Hands: None  Permission Sought/Granted                  Emotional Assessment Appearance:: Appears stated age Attitude/Demeanor/Rapport: Engaged Affect (typically observed): Accepting Orientation: : Oriented to Self, Oriented to Place, Oriented to  Time, Oriented to Situation   Psych Involvement: No (comment)  Admission diagnosis:  Small bowel obstruction ([place]) [K56.609] Patient Active Problem List   Diagnosis Date Noted  . Tobacco use   . SBO (small bowel obstruction) ([place]) 05/03/2018  . Asthma 05/03/2018   PCP:  Patient, No Pcp Per Pharmacy:   [facility], [place] [address] Phone: [phone] Fax: [phone]     Social Determinants of Health (SDOH) Interventions    Readmission Risk Interventions No flowsheet data found.

## 2018-12-21 NOTE — Consult Note (Addendum)
[facility] Consult: 12:13 PM 12/21/2018  LOS: 0 days    Referring Provider: Dr Ardelia Mems, [facility] teaching attending.    Primary Care Physician: None Primary Gastroenterologist: Althia Forts.  Seen inpt by Dr Therisa Doyne in 05/2018, no offic fup.      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   HPI: Eric Abarca. is a 43 y.o. male.  Bronchiolitis, bronchitis.  Diminished TSH and free T4 noted on labs 05/2018, this does not appear to have been worked up.  Began having GI issues in the fall 2019.  Scheduled for colonoscopy in [place] but did not tolerate prep.  Discharged on steroids, Cipro, Flagyl.  Admitted to [facility] in 05/2018 with recurrent GI symptoms and diagnosed with SBO. CT scan confirmed SBO with transition point at the terminal ileum, concerning for Crohn's disease.  Imaging also revealed 19 mm fatty liver cyst and small likely hemangioma in liver.  SBO managed medically with NG tube, ultimately resolved.  ESR 46, CRP 66, both elevated.  QuantiFERON gold, HB surface Ag both negative in anticipation of requiring future biologics or immunosuppressants.  Discharged on prednisone taper, starting dose 40 mg, over 4 weeks, and to complete total 10-day course of Cipro, Flagyl. Patient never returned to Dr. Encarnacion Slates office for outpatient colonoscopy, he did not have insurance at the time. Patient returned to the hospital yesterday evening and admitted with recurrent SBO vs PSBO.  3 days of abdominal pain, nausea but not vomiting.  Diminished stool output, reports no stools for 3 or 4 days.  Prior to this he reports a few minor incidences of abdominal discomfort, bloating and decreased stools which he addressed by switching to liquid diet.  His minor flares would resolve within a day or so.  Generally  he has a good appetite and his weight has been stable CT: Thickening and inflammation in the region of the terminal ileum concerning for infectious versus inflammatory enteritis.  Some degree of associated bowel obstruction with mild dilation, fluid and stool in SB proximal to the area of inflammation. No elevated WBCs.  Hgb 11.3, was 13 in September 2019.  Albumin and total protein are diminished.  Potassium of 3.3 has been corrected.   Maternal hx Crohn's disease requiring surgical management.  She died of MI in her late 64s colon cancer in his father in his mid to late 24s.  He is now in his early 68s and cancer is said to be in remission.  Patient has no siblings. Pt smokes about 8 cigarettes daily.  Occasionally drinks alcohol moderately and occasionally smokes marijuana.   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  He and his wife are living with his father.    Past Medical History:  Diagnosis Date  . Asthma   . Crohn's disease ([place])   . SBO (small bowel obstruction) ([place])     History reviewed. No pertinent surgical history.  Prior to Admission medications   Medication Sig Start  Date End Date Taking? Authorizing Provider  albuterol (PROVENTIL HFA;VENTOLIN HFA) 108 (90 Base) MCG/ACT inhaler Inhale 1-2 puffs into the lungs every 6 (six) hours as needed for wheezing or shortness of breath.   Yes [provider]  predniSONE (DELTASONE) 10 MG tablet 4 tablets 1 week, 3 tablets 1 week, 2 tablets 1 week, 1 tablet 1 week Patient not taking: Reported on 12/20/2018 05/06/18   Reyne Dumas, MD    Scheduled Meds: . enoxaparin (LOVENOX) injection  40 mg Subcutaneous Q24H  . feeding supplement  1 Container Oral BID BM  . predniSONE  40 mg Oral Q breakfast   Infusions: . sodium chloride 100 mL/hr at 12/21/18 1045   PRN Meds: morphine injection, ondansetron (ZOFRAN) IV   Allergies as of  12/20/2018  . 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 Crohn's disease Mother   . Heart attack Mother        In her late 83s.  This was the cause of her death.  . Colon cancer Father        In his mid-to-late 78s.  As of 2020, patient in his early 55s and cancer said to be in remission.    See HPI.    REVIEW OF SYSTEMS: Constitutional: No weakness, no fatigue. ENT:  No nose bleeds Pulm: No shortness of breath.  No cough. CV:  No palpitations, no LE edema.  No chest pain. GU:  No hematuria, no frequency GI: Per HPI. Heme: No unusual or excessive bleeding or bruising. Transfusions: None ever. Neuro:  No headaches, no peripheral tingling or numbness.  No excessive sleepiness.  No seizures. Derm:  No itching, no rash or sores.  Endocrine:  No sweats or chills.  No polyuria or dysuria Immunization: Not queried. Travel:  None beyond local counties in last few months.    PHYSICAL EXAM: Vital signs in last 24 hours: Vitals:   12/21/18 0104 12/21/18 0514  BP: (!) 145/90 121/80  Pulse: 70 68  Resp: 16 16  Temp: 98.4 F (36.9 C) 98.2 F (36.8 C)  SpO2: 100% 99%   Wt Readings from Last 3 Encounters:  05/03/18 62.4 kg    General: Pleasant, comfortable, non-ill-appearing Head: No facial asymmetry or swelling.  No signs of head trauma. Eyes: No scleral icterus.  No conjunctival pallor.  EOMI. Ears: Good hearing. Nose: No discharge or congestion. Mouth: Tongue midline.  Good dentition.  Oral mucosa pink, moist, clear. Neck: No JVD, no masses, no thyromegaly. Lungs: Clear bilaterally, no labored breathing or cough. Heart: RRR.  No MRG.  S1, S2 present. Abdomen: Soft.  Tender diffusely without guarding or rebound.  Bowel sounds active.  No distention, no succession splash or fluid wave.  No HSM, masses,  bruits, hernias..   Rectal: Deferred. Musc/Skeltl: No joint redness, swelling or gross deformity. Extremities: No CCE. Neurologic: Alert.  Oriented x3.  No tremors, no limb weakness.  Moves all 4 limbs freely. Skin: No rash, no sores. Tattoos: Yes on his trunk. Nodes: No cervical adenopathy.  No inguinal adenopathy. Psych: Does not, cooperative, calm.  Fluid speech.  Good historian.  Intake/Output from previous day: 04/21 0701 - 04/22 0700 In: 2764 [I.V.:1464; IV Piggyback:1300] Out: 400 [Urine:400] Intake/Output this shift: No intake/output data recorded.  LAB  RESULTS: Recent Labs    12/20/18 2133 12/21/18 0257  WBC 9.2 9.9  HGB 11.3* 11.3*  HCT 35.6* 34.9*  PLT 355 380   BMET Lab Results  Component Value Date   NA 141 12/21/2018   NA 140 12/20/2018   NA 136 05/04/2018   K 3.8 12/21/2018   K 3.3 (L) 12/20/2018   K 3.8 05/04/2018   CL 108 12/21/2018   CL 106 12/20/2018   CL 104 05/04/2018   CO2 24 12/21/2018   CO2 26 12/20/2018   CO2 24 05/04/2018   GLUCOSE 90 12/21/2018   GLUCOSE 99 12/20/2018   GLUCOSE 94 05/04/2018   BUN 7 12/21/2018   BUN 8 12/20/2018   BUN 9 05/04/2018   CREATININE 0.92 12/21/2018   CREATININE 0.89 12/20/2018   CREATININE 0.89 05/04/2018   CALCIUM 8.4 (L) 12/21/2018   CALCIUM 8.7 (L) 12/20/2018   CALCIUM 9.2 05/04/2018   LFT Recent Labs    12/20/18 2133  PROT 5.9*  ALBUMIN 2.6*  AST 12*  ALT 11  ALKPHOS 55  BILITOT 0.5   Lipase     Component Value Date/Time   LIPASE 33 12/20/2018 2133      RADIOLOGY STUDIES: Ct Abdomen Pelvis W Contrast  Result Date: 12/20/2018 CLINICAL DATA:  Abdominal distention EXAM: CT ABDOMEN AND PELVIS WITH CONTRAST TECHNIQUE: Multidetector CT imaging of the abdomen and pelvis was performed using the standard protocol following bolus administration of intravenous contrast. CONTRAST:  181m OMNIPAQUE IOHEXOL 300 MG/ML  SOLN COMPARISON:  05/03/2018 FINDINGS: Lower chest: Dependent atelectasis.  No  acute abnormality. Hepatobiliary: 2.1 cm cyst in the right hepatic lobe. No suspicious attic abnormality. Gallbladder is contracted, grossly unremarkable. Pancreas: No focal abnormality or ductal dilatation. Spleen: No focal abnormality.  Normal size. Adrenals/Urinary Tract: No adrenal abnormality. No focal renal abnormality. No stones or hydronephrosis. Urinary bladder is unremarkable. Stomach/Bowel: There is abnormal wall thickening and surrounding inflammation involving the terminal ileum concerning for infectious or inflammatory enteritis. Crohn's disease not excluded. Small bowel proximal to this is mildly dilated suggesting a component of obstruction. Appendix immediately adjacent to the terminal ileum appears normal. Stomach and large bowel grossly unremarkable. Vascular/Lymphatic: No evidence of aneurysm or adenopathy. Reproductive: No visible focal abnormality. Other: No free fluid or free air. Musculoskeletal: No acute bony abnormality. IMPRESSION: Abnormal wall thickening and surrounding inflammation involving the terminal ileum concerning for infectious or inflammatory enteritis. Inflammatory bowel disease not excluded. This appears to cause some degree of bowel obstruction with mildly dilated fluid and stool filled small bowel loops proximal to the terminal ileum. Electronically Signed   By: KRolm BaptiseM.D.   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  Rx with prednisone, abx in past.      *    family history of colon cancer  *    Minor, normocytic anemia  *   Protein, albumin deficiency likely some degree of protein malnutrition.    *   Low TSH, low free T4 in September 2020  *   quantiferon gold and Hep B surf Ag negative 05/2019  PLAN:     *   Needs colonoscopy when able to tolerate bowel prep.   Also, if possible, may be in patient's interest to wait until his medical insurance kicks in.  Previously unable to comply  with suggestion for colonoscopy due to lack of medical insurance.  *  Begin prednisone 40 mg.   allow clear liquids, advance to fulls as tolerated.  Add resource breeze.     *    Repeat TSH and free T4 in the a.m.   Eric Freed, PA-C  12/21/2018, 12:13 PM Phone [phone]     [place] Attending   I have taken an interval history, reviewed the chart and examined the patient. I agree with the Advanced Practitioner's note, impression and recommendations.   Clinical hx very comatible w/ Crohn's ielitis/ileocolitis - he responded to prednisone and relapsed after  Colonoscopy important but can wait - also needs due to FHx CRCA  He has had flatus today  Treat w/ prednisone + I will also Rx 1 dose solumedrol today  Hopefully home soon and outpatient f/u  Stay on prednisone - would taper to low dose and not taper to off  Will see tomorrow  Need to remind that smoking cessation may reduce Crohn's activity as well as other health benefits  Gatha Mayer, MD, [facility] 12/21/2018 12:17 PM Pager [phone]

## 2018-12-21 NOTE — Consult Note (Signed)
Reason for Consult: abd pain Referring Physician Irena Cords, PA  Deloy Archey. is an 43 y.o. male.  HPI: 34 yom who has history of admission Sep 2019 for what appeared to be Crohns disease with terminal ileum inflammation that resolved with prednisone.  He was supposed to follow up with GI but never did.  He now returns with several days of abdominal discomfort and decreased passage of flatus and stool. No BM for 3 days now.  Nothing making it better.  No sob, no cough, no urinary symptoms, some nausea, no emesis.  He underwent ct scan and I was called to evaluate him for "sbo".    Past Medical History:  Diagnosis Date  . Asthma   . SBO (small bowel obstruction) ([place])     History reviewed. No pertinent surgical history.  No family history on file.  Social History:  reports that he has been smoking. He has never used smokeless tobacco. He reports current alcohol use. He reports current drug use. Drug: Marijuana.  Allergies: No Known Allergies  Medications: I have reviewed the patient's current medications.  Results for orders placed or performed during the hospital encounter of 12/20/18 (from the past 48 hour(s))  Comprehensive metabolic panel     Status: Abnormal   Collection Time: 12/20/18  9:33 PM  Result Value Ref Range   Sodium 140 135 - 145 mmol/L   Potassium 3.3 (L) 3.5 - 5.1 mmol/L   Chloride 106 98 - 111 mmol/L   CO2 26 22 - 32 mmol/L   Glucose, Bld 99 70 - 99 mg/dL   BUN 8 6 - 20 mg/dL   Creatinine, Ser 0.89 0.61 - 1.24 mg/dL   Calcium 8.7 (L) 8.9 - 10.3 mg/dL   Total Protein 5.9 (L) 6.5 - 8.1 g/dL   Albumin 2.6 (L) 3.5 - 5.0 g/dL   AST 12 (L) 15 - 41 U/L   ALT 11 0 - 44 U/L   Alkaline Phosphatase 55 38 - 126 U/L   Total Bilirubin 0.5 0.3 - 1.2 mg/dL   GFR calc non Af Amer >60 >60 mL/min   GFR calc Af Amer >60 >60 mL/min   Anion gap 8 5 - 15    Comment: Performed at [facility], [address], [place], [place] [address]  CBC with Differential      Status: Abnormal   Collection Time: 12/20/18  9:33 PM  Result Value Ref Range   WBC 9.2 4.0 - 10.5 K/uL   RBC 4.10 (L) 4.22 - 5.81 MIL/uL   Hemoglobin 11.3 (L) 13.0 - 17.0 g/dL   HCT 35.6 (L) 39.0 - 52.0 %   MCV 86.8 80.0 - 100.0 fL   MCH 27.6 26.0 - 34.0 pg   MCHC 31.7 30.0 - 36.0 g/dL   RDW 15.6 (H) 11.5 - 15.5 %   Platelets 355 150 - 400 K/uL   nRBC 0.0 0.0 - 0.2 %   Neutrophils Relative % 63 %   Neutro Abs 5.6 1.7 - 7.7 K/uL   Lymphocytes Relative 11 %   Lymphs Abs 1.0 0.7 - 4.0 K/uL   Monocytes Relative 16 %   Monocytes Absolute 1.5 (H) 0.1 - 1.0 K/uL   Eosinophils Relative 10 %   Eosinophils Absolute 1.0 (H) 0.0 - 0.5 K/uL   Basophils Relative 0 %   Basophils Absolute 0.0 0.0 - 0.1 K/uL   Immature Granulocytes 0 %   Abs Immature Granulocytes 0.03 0.00 - 0.07 K/uL  Comment: Performed at [facility], [place] [address], [place], [place] [address]  Lipase, blood     Status: None   Collection Time: 12/20/18  9:33 PM  Result Value Ref Range   Lipase 33 11 - 51 U/L    Comment: Performed at [facility] [address] [place], [place] [address]  Urinalysis, Routine w reflex microscopic     Status: Abnormal   Collection Time: 12/20/18  9:34 PM  Result Value Ref Range   Color, Urine YELLOW YELLOW   APPearance CLEAR CLEAR   Specific Gravity, Urine 1.030 1.005 - 1.030   pH 5.0 5.0 - 8.0   Glucose, UA NEGATIVE NEGATIVE mg/dL   Hgb urine dipstick NEGATIVE NEGATIVE   Bilirubin Urine NEGATIVE NEGATIVE   Ketones, ur 5 (A) NEGATIVE mg/dL   Protein, ur NEGATIVE NEGATIVE mg/dL   Nitrite NEGATIVE NEGATIVE   Leukocytes,Ua TRACE (A) NEGATIVE   RBC / HPF 0-5 0 - 5 RBC/hpf   WBC, UA 6-10 0 - 5 WBC/hpf   Bacteria, UA NONE SEEN NONE SEEN   Squamous Epithelial / LPF 0-5 0 - 5   Mucus PRESENT    Ca Oxalate Crys, UA PRESENT     Comment: Performed at [facility], [address], [place], [place] [address]    Ct Abdomen Pelvis W Contrast  Result Date:  12/20/2018 CLINICAL DATA:  Abdominal distention EXAM: CT ABDOMEN AND PELVIS WITH CONTRAST TECHNIQUE: Multidetector CT imaging of the abdomen and pelvis was performed using the standard protocol following bolus administration of intravenous contrast. CONTRAST:  149m OMNIPAQUE IOHEXOL 300 MG/ML  SOLN COMPARISON:  05/03/2018 FINDINGS: Lower chest: Dependent atelectasis.  No acute abnormality. Hepatobiliary: 2.1 cm cyst in the right hepatic lobe. No suspicious attic abnormality. Gallbladder is contracted, grossly unremarkable. Pancreas: No focal abnormality or ductal dilatation. Spleen: No focal abnormality.  Normal size. Adrenals/Urinary Tract: No adrenal abnormality. No focal renal abnormality. No stones or hydronephrosis. Urinary bladder is unremarkable. Stomach/Bowel: There is abnormal wall thickening and surrounding inflammation involving the terminal ileum concerning for infectious or inflammatory enteritis. Crohn's disease not excluded. Small bowel proximal to this is mildly dilated suggesting a component of obstruction. Appendix immediately adjacent to the terminal ileum appears normal. Stomach and large bowel grossly unremarkable. Vascular/Lymphatic: No evidence of aneurysm or adenopathy. Reproductive: No visible focal abnormality. Other: No free fluid or free air. Musculoskeletal: No acute bony abnormality. IMPRESSION: Abnormal wall thickening and surrounding inflammation involving the terminal ileum concerning for infectious or inflammatory enteritis. Inflammatory bowel disease not excluded. This appears to cause some degree of bowel obstruction with mildly dilated fluid and stool filled small bowel loops proximal to the terminal ileum. Electronically Signed   By: KRolm BaptiseM.D.   On: 12/20/2018 22:56    Review of Systems  Gastrointestinal: Positive for abdominal pain, constipation and nausea.  All other systems reviewed and are negative.  Blood pressure (!) 146/95, pulse 73, temperature 98.8 F  (37.1 C), temperature source Oral, resp. rate 16, SpO2 95 %. Physical Exam  Vitals reviewed. Constitutional: He is oriented to person, place, and time. He appears well-developed and well-nourished. No distress.  HENT:  Head: Normocephalic and atraumatic.  Right Ear: External ear normal.  Eyes: EOM are normal. No scleral icterus.  Neck: Neck supple.  Cardiovascular: Normal rate, regular rhythm and normal heart sounds.  Respiratory: Effort normal and breath sounds normal.  GI: Soft. Bowel sounds are normal. He exhibits no distension. There is abdominal  tenderness (mild lower abdomen).  Lymphadenopathy:    He has no cervical adenopathy.  Neurological: He is alert and oriented to person, place, and time.  Skin: Skin is warm and dry.  Psychiatric: He has a normal mood and affect. His behavior is normal.    Assessment/Plan: Partial SBO Likely inflammatory bowel disease -would make npo, no need for ng tube -needs gi consult -no current indication for surgery Hypokalemia- would recommend replacing this DVT- recommend lovenox  Rolm Bookbinder 12/21/2018, 12:16 AM

## 2018-12-21 NOTE — H&P (Addendum)
[facility] Admission History and Physical Service Pager: [phone]  Patient name: Eric Castillo. Medical record number: [id number] Date of birth: Aug 27, 1976 Age: 43 y.o. Gender: male  Primary Care Provider: Patient, No Pcp Per Consultants: General Surgery, GI Code Status: FULL  Chief Complaint: abdominal pain  Assessment and Plan: Eric Marxen. is a 43 y.o. male presenting with abdominal pain. PMH is significant for SBO possibly due to Crohn's disease, tobacco use, asthma.  Partial SBO: Patient presented with nausea, abdominal discomfort, lack of flatus and bowel movements for the last 5 days.  He had a similar presentation in September 2019 where he was diagnosed with an SBO thought likely to be due to an inflammatory bowel disease such as Crohn's.  His SBO resolved with small bowel protocol including NG tube placement at that time, and he was discharged on a prednisone taper and scheduled for an outpatient colonoscopy with [facility].  Unfortunately, he was lost to follow-up and did not undergo the colonoscopy.  Labs are largely within normal limits except for mild anemia with Hgb 11.3, which could be anemia of chronic disease or iron deficiency in setting of possible longstanding microscopic blood loss in stool.  Imaging shows evidence of inflammatory bowel disease and some degree of a small bowel obstruction.  Patient has had no history of abdominal surgeries, so it is likely that his small bowel obstruction is due to the inflammatory changes noted in his bowel.  General surgery has been consulted, and their recommendations will be listed below. -Admit to [facility], attending Dr. Ardelia Mems -General surgery consulted, appreciate their recommendations -N.p.o., will not place NG tube per surgery recs -Zofran 4 mg every 8 hours as needed for nausea -Morphine 4 mg Q4H PRN for abdominal pain -[facility] consult in the morning -Replenish potassium -Normal saline at 100  mL/h -A.m. BMP and CBC  Asthma, well controlled: Patient says he takes as needed albuterol for this but has not needed it for a while.  Seems to be well controlled. -We will add albuterol if needed  Tobacco use: Patient says that he smokes 1/2 pack a day. -Add nicotine patch on patient request  FEN/GI: NS at 100 mL/h, n.p.o. Prophylaxis: Lovenox  Disposition: Home after SBO resolves and GI follow-up is established  History of Present Illness:  Eric Gangemi. is a 43 y.o. male presenting with abdominal pain, found to be due to partial SBO on CT scan, the source of which is likely abnormalities in the intestinal wall due to inflammatory bowel disease.  The patient says that he has had difficulties tolerating food since September 2019.  He was admitted at that time in [place] and then 1.5 weeks later at [facility] and improved with small bowel protocol involving NG tube.  He was supposed to follow-up with [facility] for colonoscopy but had a death in the family and did not have insurance so he did not complete this follow-up.  Since then, he has had intermittent episodes of abdominal pain with some nausea and reduction in flatus and bowel movements, but they have self resolved.  He also notes that he has often needed to wake up to have a bowel movement in the past several years, but he denies any bloody or black stool.  On Thursday, April 17, patient began to have epigastric discomfort with nausea and was unable to pass gas or have a bowel movement.  He did not have any episodes of vomiting.  He presented  to the emergency department today because his symptoms have not improved.  In the ED, he received a bolus of normal saline and morphine 4 mg for abdominal pain.  A CT abdomen/pelvis was performed with the results listed below.  Review Of Systems: Per HPI with the following additions:   Review of Systems  Constitutional: Negative for fever, malaise/fatigue and weight loss.  HENT:  Negative for sore throat.   Respiratory: Negative for cough and shortness of breath.   Cardiovascular: Negative for chest pain.  Gastrointestinal: Positive for abdominal pain, constipation and nausea. Negative for blood in stool, diarrhea, melena and vomiting.  Genitourinary: Negative for dysuria.    Patient Active Problem List   Diagnosis Date Noted  . SBO (small bowel obstruction) ([place]) 05/03/2018  . Asthma 05/03/2018    Past Medical History: Past Medical History:  Diagnosis Date  . Asthma   . SBO (small bowel obstruction) ([facility])     Past Surgical History: History reviewed. No pertinent surgical history.  Social History: Social History   Tobacco Use  . Smoking status: Current Every Day Smoker  . Smokeless tobacco: Never Used  Substance Use Topics  . Alcohol use: Yes  . Drug use: Yes    Types: Marijuana   Additional social history:   Please also refer to relevant sections of EMR.  Family History: No family history on file. No family history of IBD, Diabetes, HTN, heart disease.  Allergies and Medications: No Known Allergies No current facility-administered medications on file prior to encounter.    Current Outpatient Medications on File Prior to Encounter  Medication Sig Dispense Refill  . albuterol (PROVENTIL HFA;VENTOLIN HFA) 108 (90 Base) MCG/ACT inhaler Inhale 1-2 puffs into the lungs every 6 (six) hours as needed for wheezing or shortness of breath.    . predniSONE (DELTASONE) 10 MG tablet 4 tablets 1 week, 3 tablets 1 week, 2 tablets 1 week, 1 tablet 1 week (Patient not taking: Reported on 12/20/2018) 30 tablet 0    Objective: BP (!) 146/95   Pulse 73   Temp 98.8 F (37.1 C) (Oral)   Resp 16   SpO2 95%  Physical Exam Constitutional:      General: He is not in acute distress.    Appearance: He is well-developed and normal weight. He is not ill-appearing.  HENT:     Head: Normocephalic and atraumatic.     Mouth/Throat:     Mouth: Mucous  membranes are moist.  Cardiovascular:     Rate and Rhythm: Normal rate and regular rhythm.     Heart sounds: Normal heart sounds. No murmur.  Pulmonary:     Effort: Pulmonary effort is normal. No respiratory distress.     Breath sounds: Normal breath sounds. No wheezing.  Abdominal:     General: Bowel sounds are decreased. There is distension.     Tenderness: There is generalized abdominal tenderness.     Hernia: No hernia is present.  Musculoskeletal: Normal range of motion.        General: No swelling or tenderness.  Skin:    General: Skin is warm and dry.  Neurological:     General: No focal deficit present.     Mental Status: He is alert and oriented to person, place, and time.  Psychiatric:        Mood and Affect: Mood normal.        Behavior: Behavior normal.      Labs and Imaging: CBC BMET  Recent Labs  Lab  12/20/18 2133  WBC 9.2  HGB 11.3*  HCT 35.6*  PLT 355   Recent Labs  Lab 12/20/18 2133  NA 140  K 3.3*  CL 106  CO2 26  BUN 8  CREATININE 0.89  GLUCOSE 99  CALCIUM 8.7*     Ct Abdomen Pelvis W Contrast  Result Date: 12/20/2018 CLINICAL DATA:  Abdominal distention EXAM: CT ABDOMEN AND PELVIS WITH CONTRAST TECHNIQUE: Multidetector CT imaging of the abdomen and pelvis was performed using the standard protocol following bolus administration of intravenous contrast. CONTRAST:  149m OMNIPAQUE IOHEXOL 300 MG/ML  SOLN COMPARISON:  05/03/2018 FINDINGS: Lower chest: Dependent atelectasis.  No acute abnormality. Hepatobiliary: 2.1 cm cyst in the right hepatic lobe. No suspicious attic abnormality. Gallbladder is contracted, grossly unremarkable. Pancreas: No focal abnormality or ductal dilatation. Spleen: No focal abnormality.  Normal size. Adrenals/Urinary Tract: No adrenal abnormality. No focal renal abnormality. No stones or hydronephrosis. Urinary bladder is unremarkable. Stomach/Bowel: There is abnormal wall thickening and surrounding inflammation involving the  terminal ileum concerning for infectious or inflammatory enteritis. Crohn's disease not excluded. Small bowel proximal to this is mildly dilated suggesting a component of obstruction. Appendix immediately adjacent to the terminal ileum appears normal. Stomach and large bowel grossly unremarkable. Vascular/Lymphatic: No evidence of aneurysm or adenopathy. Reproductive: No visible focal abnormality. Other: No free fluid or free air. Musculoskeletal: No acute bony abnormality. IMPRESSION: Abnormal wall thickening and surrounding inflammation involving the terminal ileum concerning for infectious or inflammatory enteritis. Inflammatory bowel disease not excluded. This appears to cause some degree of bowel obstruction with mildly dilated fluid and stool filled small bowel loops proximal to the terminal ileum. Electronically Signed   By: KRolm BaptiseM.D.   On: 12/20/2018 22:56     WKathrene Alu MD 12/21/2018, 12:05 AM PGY-2, CM[facility] LakeIntern pager: [phone] text pages welcome

## 2018-12-21 NOTE — Progress Notes (Signed)
[facility] Progress Note     Subjective: CC: abdominal pain Patient reports generalized abdominal pain, slightly better than when he came to the hospital. Still no flatus.   Objective: Vital signs in last 24 hours: Temp:  [98.2 F (36.8 C)-98.8 F (37.1 C)] 98.2 F (36.8 C) (04/22 0514) Pulse Rate:  [67-90] 68 (04/22 0514) Resp:  [14-20] 16 (04/22 0514) BP: (120-156)/(72-99) 121/80 (04/22 0514) SpO2:  [95 %-100 %] 99 % (04/22 0514) Last BM Date: 12/16/18  Intake/Output from previous day: 04/21 0701 - 04/22 0700 In: 2764 [I.V.:1464; IV Piggyback:1300] Out: 400 [Urine:400] Intake/Output this shift: No intake/output data recorded.  PE: Gen:  Alert, NAD, pleasant Pulm:  Normal effort Abd: Soft, generalized mild TTP, non-distended, +BS Skin: warm and dry, no rashes  Psych: A&Ox3   Lab Results:  Recent Labs    12/20/18 2133 12/21/18 0257  WBC 9.2 9.9  HGB 11.3* 11.3*  HCT 35.6* 34.9*  PLT 355 380   BMET Recent Labs    12/20/18 2133 12/21/18 0257  NA 140 141  K 3.3* 3.8  CL 106 108  CO2 26 24  GLUCOSE 99 90  BUN 8 7  CREATININE 0.89 0.92  CALCIUM 8.7* 8.4*   PT/INR No results for input(s): LABPROT, INR in the last 72 hours. CMP     Component Value Date/Time   NA 141 12/21/2018 0257   K 3.8 12/21/2018 0257   CL 108 12/21/2018 0257   CO2 24 12/21/2018 0257   GLUCOSE 90 12/21/2018 0257   BUN 7 12/21/2018 0257   CREATININE 0.92 12/21/2018 0257   CALCIUM 8.4 (L) 12/21/2018 0257   PROT 5.9 (L) 12/20/2018 2133   ALBUMIN 2.6 (L) 12/20/2018 2133   AST 12 (L) 12/20/2018 2133   ALT 11 12/20/2018 2133   ALKPHOS 55 12/20/2018 2133   BILITOT 0.5 12/20/2018 2133   GFRNONAA >60 12/21/2018 0257   GFRAA >60 12/21/2018 0257   Lipase     Component Value Date/Time   LIPASE 33 12/20/2018 2133       Studies/Results: Ct Abdomen Pelvis W Contrast  Result Date: 12/20/2018 CLINICAL DATA:  Abdominal distention EXAM: CT ABDOMEN AND PELVIS WITH  CONTRAST TECHNIQUE: Multidetector CT imaging of the abdomen and pelvis was performed using the standard protocol following bolus administration of intravenous contrast. CONTRAST:  143m OMNIPAQUE IOHEXOL 300 MG/ML  SOLN COMPARISON:  05/03/2018 FINDINGS: Lower chest: Dependent atelectasis.  No acute abnormality. Hepatobiliary: 2.1 cm cyst in the right hepatic lobe. No suspicious attic abnormality. Gallbladder is contracted, grossly unremarkable. Pancreas: No focal abnormality or ductal dilatation. Spleen: No focal abnormality.  Normal size. Adrenals/Urinary Tract: No adrenal abnormality. No focal renal abnormality. No stones or hydronephrosis. Urinary bladder is unremarkable. Stomach/Bowel: There is abnormal wall thickening and surrounding inflammation involving the terminal ileum concerning for infectious or inflammatory enteritis. Crohn's disease not excluded. Small bowel proximal to this is mildly dilated suggesting a component of obstruction. Appendix immediately adjacent to the terminal ileum appears normal. Stomach and large bowel grossly unremarkable. Vascular/Lymphatic: No evidence of aneurysm or adenopathy. Reproductive: No visible focal abnormality. Other: No free fluid or free air. Musculoskeletal: No acute bony abnormality. IMPRESSION: Abnormal wall thickening and surrounding inflammation involving the terminal ileum concerning for infectious or inflammatory enteritis. Inflammatory bowel disease not excluded. This appears to cause some degree of bowel obstruction with mildly dilated fluid and stool filled small bowel loops proximal to the terminal ileum. Electronically Signed   By: KRolm BaptiseM.D.  On: 12/20/2018 22:56    Anti-infectives: Anti-infectives (From admission, onward)   None       Assessment/Plan Hx of asthma  Hx of IBD - suspected to be Crohn's colitis PSBO - recommend GI consult - continue bowel rest  - mobilize as able - no indication for surgical intervention at this  time Hypokalemia - K 3.8 this AM, improving  FEN: NPO, IVF VTE: SCDs, lovenox ID: no abx indicated at this time   LOS: 0 days    Brigid Re , [facility] 12/21/2018, 8:18 AM Pager: [phone] Consults: [phone]

## 2018-12-22 LAB — BASIC METABOLIC PANEL
Anion gap: 10 (ref 5–15)
BUN: 6 mg/dL (ref 6–20)
CO2: 22 mmol/L (ref 22–32)
Calcium: 9 mg/dL (ref 8.9–10.3)
Chloride: 106 mmol/L (ref 98–111)
Creatinine, Ser: 0.84 mg/dL (ref 0.61–1.24)
GFR calc Af Amer: >60 mL/min (ref >60)
GFR calc non Af Amer: >60 mL/min (ref >60)
Glucose, Bld: 101 mg/dL — ABNORMAL HIGH (ref 70–99)
Potassium: 3.8 mmol/L (ref 3.5–5.1)
Sodium: 138 mmol/L (ref 135–145)

## 2018-12-22 LAB — CBC
HCT: 36.2 % — ABNORMAL LOW (ref 39.0–52.0)
Hemoglobin: 11.8 g/dL — ABNORMAL LOW (ref 13.0–17.0)
MCH: 27.6 pg (ref 26.0–34.0)
MCHC: 32.6 g/dL (ref 30.0–36.0)
MCV: 84.8 fL (ref 80.0–100.0)
Platelets: 400 10*3/uL (ref 150–400)
RBC: 4.27 MIL/uL (ref 4.22–5.81)
RDW: 15.3 % (ref 11.5–15.5)
WBC: 10.6 10*3/uL — ABNORMAL HIGH (ref 4.0–10.5)
nRBC: 0 % (ref 0.0–0.2)

## 2018-12-22 LAB — T4, FREE: Free T4: 0.72 ng/dL — ABNORMAL LOW (ref 0.82–1.77)

## 2018-12-22 LAB — TSH: TSH: 0.481 u[IU]/mL (ref 0.350–4.500)

## 2018-12-22 MED ORDER — PREDNISONE 10 MG PO TABS: ORAL_TABLET | ORAL | 0 refills | Status: DC

## 2018-12-22 MED ORDER — MORPHINE SULFATE (PF) 2 MG/ML IV SOLN: 2.0000 mg | INTRAVENOUS | Status: DC | PRN

## 2018-12-22 NOTE — Progress Notes (Signed)
Eric Castillo. to be D/C'd per MD order. Discussed with the patient and all questions fully answered. ? VSS, Skin clean, dry and intact without evidence of skin break down, no evidence of skin tears noted. ? IV catheter discontinued intact. Site without signs and symptoms of complications. Dressing and pressure applied. ? An After Visit Summary was printed and given to the patient. Patient informed where to pickup prescriptions. ? D/c education completed with patient/family including follow up instructions, medication list, d/c activities limitations if indicated, with other d/c instructions as indicated by MD - patient able to verbalize understanding, all questions fully answered.  ? Patient instructed to return to ED, call 911, or call MD for any changes in condition.  ? Patient to be escorted via [place], and D/C home via private auto.

## 2018-12-22 NOTE — Discharge Summary (Signed)
[facility] Discharge Summary  Patient name: Eric Castillo. Medical record number: [id number] Date of birth: [birth year] Age: 43 y.o. Gender: male Date of Admission: 12/20/2018  Date of Discharge: 12/22/18 Admitting Physician: Leeanne Rio, MD  Primary Care Provider: Patient, No Pcp Per Consultants: GI, general surgery  Indication for Hospitalization: partial SBO d/t likely Crohn's disease  Discharge Diagnoses/Problem List:  Recurrent SBO's due to presumed untreated Crohn's disease Mild intermittent asthma Tobacco use  Disposition: home  Discharge Condition: stable, improved  Discharge Exam:  General: standing in room beside bed, appears comfortable Cardiovascular: RRR, no murmurs Respiratory: CTAB Abdomen: soft, nondistended, normal bowel sounds Extremities: no edema, moves all extremities spontaneously  Brief Hospital Course:  Eric Castillo was admitted on 4/22 for abdominal pain and an SBO found on CT scan.  SBO was thought to be likely due to inflammatory ileocolitis, likely Crohn's disease.  He had been diagnosed with this in September after CT scan showed a similar finding, but he was lost to outpatient follow-up and never received a colonoscopy.  GI and general surgery were consulted.  He was n.p.o. with fluids for his SBO and did not require an NG tube.  GI agreed with the likely diagnosis of Crohn's and gave him Solu-Medrol and started a prednisone taper.  Patient's abdominal pain improved and he began having flatus and had 2 bowel movements on 4/22.  His diet was advanced to clear liquids, full liquids, then soft, which he tolerated well.  He was discharged on 4/23 with a prednisone taper of 40 mg daily for 1 week, 30 mg daily for 1 week, then 20 mg daily until 5/19, when he will see GI outpatient.  This course is not long enough to require PCP prophylaxis.  He understands the importance of following up with GI in getting a colonoscopy for  definitive diagnosis.  He plans on getting insurance with his new job before 5/19.  Issues for Follow Up:  1. Please ensure that patient follows up with GI. 2. Patient was counseled on tobacco cessation and its effect on Crohn's disease.  Please continue encouraging tobacco cessation.  Significant Procedures: None  Significant Labs and Imaging:  Recent Labs  Lab 12/20/18 2133 12/21/18 0257 12/22/18 0424  WBC 9.2 9.9 10.6*  HGB 11.3* 11.3* 11.8*  HCT 35.6* 34.9* 36.2*  PLT 355 380 400   Recent Labs  Lab 12/20/18 2133 12/21/18 0257 12/22/18 0424  NA 140 141 138  K 3.3* 3.8 3.8  CL 106 108 106  CO2 26 24 22   GLUCOSE 99 90 101*  BUN 8 7 6   CREATININE 0.89 0.92 0.84  CALCIUM 8.7* 8.4* 9.0  ALKPHOS 55  --   --   AST 12*  --   --   ALT 11  --   --   ALBUMIN 2.6*  --   --     Ct Abdomen Pelvis W Contrast  Result Date: 12/20/2018 CLINICAL DATA:  Abdominal distention EXAM: CT ABDOMEN AND PELVIS WITH CONTRAST TECHNIQUE: Multidetector CT imaging of the abdomen and pelvis was performed using the standard protocol following bolus administration of intravenous contrast. CONTRAST:  166m OMNIPAQUE IOHEXOL 300 MG/ML  SOLN COMPARISON:  05/03/2018 FINDINGS: Lower chest: Dependent atelectasis.  No acute abnormality. Hepatobiliary: 2.1 cm cyst in the right hepatic lobe. No suspicious attic abnormality. Gallbladder is contracted, grossly unremarkable. Pancreas: No focal abnormality or ductal dilatation. Spleen: No focal abnormality.  Normal size. Adrenals/Urinary Tract: No adrenal abnormality. No focal  renal abnormality. No stones or hydronephrosis. Urinary bladder is unremarkable. Stomach/Bowel: There is abnormal wall thickening and surrounding inflammation involving the terminal ileum concerning for infectious or inflammatory enteritis. Crohn's disease not excluded. Small bowel proximal to this is mildly dilated suggesting a component of obstruction. Appendix immediately adjacent to the terminal  ileum appears normal. Stomach and large bowel grossly unremarkable. Vascular/Lymphatic: No evidence of aneurysm or adenopathy. Reproductive: No visible focal abnormality. Other: No free fluid or free air. Musculoskeletal: No acute bony abnormality. IMPRESSION: Abnormal wall thickening and surrounding inflammation involving the terminal ileum concerning for infectious or inflammatory enteritis. Inflammatory bowel disease not excluded. This appears to cause some degree of bowel obstruction with mildly dilated fluid and stool filled small bowel loops proximal to the terminal ileum. Electronically Signed   By: Rolm Baptise M.D.   On: 12/20/2018 22:56     Results/Tests Pending at Time of Discharge: none  Discharge Medications:  Allergies as of 12/22/2018   No Known Allergies     Medication List    TAKE these medications   albuterol 108 (90 Base) MCG/ACT inhaler Commonly known as:  VENTOLIN HFA Inhale 1-2 puffs into the lungs every 6 (six) hours as needed for wheezing or shortness of breath.   predniSONE 10 MG tablet Commonly known as:  DELTASONE Take 4 tablets (40 mg total) by mouth daily for 6 days, THEN 3 tablets (30 mg total) daily for 7 days, THEN 2 tablets (20 mg total) daily for 14 days. Start taking on:  December 22, 2018 What changed:  See the new instructions.       Discharge Instructions: Please refer to Patient Instructions section of EMR for full details.  Patient was counseled important signs and symptoms that should prompt return to medical care, changes in medications, dietary instructions, activity restrictions, and follow up appointments.   Follow-Up Appointments: Follow-up Information    Gatha Mayer, MD. Go on 01/17/2019.   Specialty:  Gastroenterology Why:  Please go to your appointment at 3:45PM. Contact information: [address] [place] [address] [phone]           Kathrene Alu, MD 12/22/2018, 12:46 PM PGY-2, [place]

## 2018-12-22 NOTE — Final Consult Note (Signed)
Consultant Final Sign-Off Note    Assessment/Final recommendations  Eric Castillo. is a 43 y.o. male followed by me for PSBO secondary to Crohn's flare. Patient has quickly improved since GI evaluation and initiation of prednisone.    Wound care (if applicable): None   Diet at discharge: per GI   Activity at discharge: per primary team   Follow-up appointment:  GI   Pending results:  Unresulted Labs (From admission, onward)   None       Medication recommendations: per GI   Other recommendations: None     Thank you for allowing [place] to participate in the care of your patient!  Please consult [place] again if you have further needs for your patient.  [name] 12/22/2018 10:17 AM    Subjective   Pain improved. Patient tolerating soft diet and having bowel function. Understands importance of GI follow up for disease process.    Objective  Vital signs in last 24 hours: Temp:  [97.6 F (36.4 C)-98.4 F (36.9 C)] 97.6 F (36.4 C) (04/23 0432) Pulse Rate:  [48-74] 48 (04/23 0432) Resp:  [16-20] 16 (04/23 0432) BP: (119-137)/(65-88) 119/65 (04/23 0432) SpO2:  [99 %] 99 % (04/23 0432)  Gen:  Alert, NAD, pleasant Pulm:  Normal effort Abd: Soft, non-tender, non-distended, +BS Skin: warm and dry, no rashes  Psych: A&Ox3   Pertinent labs and Studies: Recent Labs    12/20/18 11-11-2131 12/21/18 0257 12/22/18 0424  WBC 9.2 9.9 10.6*  HGB 11.3* 11.3* 11.8*  HCT 35.6* 34.9* 36.2*   BMET Recent Labs    12/21/18 0257 12/22/18 0424  NA 141 138  K 3.8 3.8  CL 108 106  CO2 24 22  GLUCOSE 90 101*  BUN 7 6  CREATININE 0.92 0.84  CALCIUM 8.4* 9.0   No results for input(s): LABURIN in the last 72 hours. No results found for this or any previous visit.  Imaging: No results found.

## 2018-12-22 NOTE — Progress Notes (Signed)
   Patient Name: Eric Castillo. Date of Encounter: 12/22/2018, 11:04 AM    Subjective  Much better, + BM   Objective  BP 119/65 (BP Location: Right Arm)   Pulse (!) 48   Temp 97.6 F (36.4 C) (Oral)   Resp 16   SpO2 99%  NAD    Assessment and Plan  Crohn's ileitis pSBO  Much improved   rec  Dc today  Prednisone 40 mg qd x 1 week, 30 mg qd x 1 week then 20 mg qd until he sees me 5/19 345  Low residue diet please    Gatha Mayer, MD, [facility] 12/22/2018 11:04 AM Pager [phone]

## 2018-12-22 NOTE — Progress Notes (Signed)
Family Medicine Teaching Service Daily Progress Note Intern Pager: [phone]  Patient name: Eric Castillo. Medical record number: [id number] Date of birth: November 18, 1975 Age: 43 y.o. Gender: male  Primary Care Provider: Patient, No Pcp Per Consultants: GI, general surgery Code Status: FULL  Pt Overview and Major Events to Date:  Admitted 4/22, GI and gen surgery consulted, prednisone and solumedrol started  Assessment and Plan: Anthonny Schiller. is a 43 y.o. male presenting with abdominal pain. PMH is significant for SBO possibly due to Crohn's disease, tobacco use, asthma.  Partial SBO, improving: Patient is now on a full liquid diet and reported flatus yesterday.  Two stools were reported yesterday as well.  Has used morphine less frequently over the last 24 hours, although the last administration was at 0300 today.  He received solumedrol x 1 yesterday. -General surgery consulted, appreciate their recommendations -GI consulted, will continue prednisone 40 mg and taper to 20 mg daily until 5/19 when he sees GI outpatient -ADAT per GI recs, advancing to soft diet this morning on patient request -Zofran 4 mg every 8 hours as needed for nausea -discontinue morphine  Asthma, well controlled: Patient says he takes as needed albuterol for this but has not needed it for a while.  Seems to be well controlled. -We will add albuterol if needed  Tobacco use: Patient says that he smokes 1/2 pack a day. -Add nicotine patch on patient request  FEN/GI: soft diet PPx: lovenox, although patient refused this morning  Disposition: home today with prednisone taper and GI follow up appointment  Subjective:  Patient says he feels much better and passed flatus overnight.  He also says he had two bowel movements yesterday.  He is ready to go home today.  Objective: Temp:  [97.6 F (36.4 C)-98.4 F (36.9 C)] 97.6 F (36.4 C) (04/23 0432) Pulse Rate:  [48-74] 48 (04/23 0432) Resp:  [16-20] 16  (04/23 0432) BP: (119-137)/(65-88) 119/65 (04/23 0432) SpO2:  [99 %] 99 % (04/23 0432) Physical Exam: General: standing in room, appears comfortable Cardiovascular: RRR, no murmurs Respiratory: CTAB Abdomen: soft, nondistended, normal bowel sounds Extremities: no edema, moves all extremities spontaneously  Laboratory: Recent Labs  Lab 12/20/18 2133 12/21/18 0257 12/22/18 0424  WBC 9.2 9.9 10.6*  HGB 11.3* 11.3* 11.8*  HCT 35.6* 34.9* 36.2*  PLT 355 380 400   Recent Labs  Lab 12/20/18 2133 12/21/18 0257 12/22/18 0424  NA 140 141 138  K 3.3* 3.8 3.8  CL 106 108 106  CO2 26 24 22   BUN 8 7 6   CREATININE 0.89 0.92 0.84  CALCIUM 8.7* 8.4* 9.0  PROT 5.9*  --   --   BILITOT 0.5  --   --   ALKPHOS 55  --   --   ALT 11  --   --   AST 12*  --   --   GLUCOSE 99 90 101*    Imaging/Diagnostic Tests: Ct Abdomen Pelvis W Contrast  Result Date: 12/20/2018 CLINICAL DATA:  Abdominal distention EXAM: CT ABDOMEN AND PELVIS WITH CONTRAST TECHNIQUE: Multidetector CT imaging of the abdomen and pelvis was performed using the standard protocol following bolus administration of intravenous contrast. CONTRAST:  184m OMNIPAQUE IOHEXOL 300 MG/ML  SOLN COMPARISON:  05/03/2018 FINDINGS: Lower chest: Dependent atelectasis.  No acute abnormality. Hepatobiliary: 2.1 cm cyst in the right hepatic lobe. No suspicious attic abnormality. Gallbladder is contracted, grossly unremarkable. Pancreas: No focal abnormality or ductal dilatation. Spleen: No focal abnormality.  Normal size. Adrenals/Urinary  Tract: No adrenal abnormality. No focal renal abnormality. No stones or hydronephrosis. Urinary bladder is unremarkable. Stomach/Bowel: There is abnormal wall thickening and surrounding inflammation involving the terminal ileum concerning for infectious or inflammatory enteritis. Crohn's disease not excluded. Small bowel proximal to this is mildly dilated suggesting a component of obstruction. Appendix immediately  adjacent to the terminal ileum appears normal. Stomach and large bowel grossly unremarkable. Vascular/Lymphatic: No evidence of aneurysm or adenopathy. Reproductive: No visible focal abnormality. Other: No free fluid or free air. Musculoskeletal: No acute bony abnormality. IMPRESSION: Abnormal wall thickening and surrounding inflammation involving the terminal ileum concerning for infectious or inflammatory enteritis. Inflammatory bowel disease not excluded. This appears to cause some degree of bowel obstruction with mildly dilated fluid and stool filled small bowel loops proximal to the terminal ileum. Electronically Signed   By: Rolm Baptise M.D.   On: 12/20/2018 22:56     Kathrene Alu, MD 12/22/2018, 7:35 AM PGY-2, [place] Intern pager: [phone], text pages welcome

## 2018-12-25 ENCOUNTER — Inpatient Hospital Stay (HOSPITAL_COMMUNITY)
Admission: EM | Admit: 2018-12-25 | Discharge: 2018-12-30 | DRG: 349 | Disposition: A | Discharge status: Home / Self Care | Payer: Self-pay | Attending: Family Medicine | Admitting: Family Medicine

## 2018-12-25 ENCOUNTER — Emergency Department (HOSPITAL_COMMUNITY): Payer: Self-pay

## 2018-12-25 ENCOUNTER — Encounter (HOSPITAL_COMMUNITY): Payer: Self-pay | Admitting: Emergency Medicine

## 2018-12-25 DIAGNOSIS — K50812 Crohn's disease of both small and large intestine with intestinal obstruction: Principal | ICD-10-CM | POA: Diagnosis present

## 2018-12-25 DIAGNOSIS — Z7952 Long term (current) use of systemic steroids: Secondary | ICD-10-CM

## 2018-12-25 DIAGNOSIS — R102 Pelvic and perineal pain: Secondary | ICD-10-CM | POA: Diagnosis present

## 2018-12-25 DIAGNOSIS — R110 Nausea: Secondary | ICD-10-CM

## 2018-12-25 DIAGNOSIS — K50919 Crohn's disease, unspecified, with unspecified complications: Secondary | ICD-10-CM

## 2018-12-25 DIAGNOSIS — K529 Noninfective gastroenteritis and colitis, unspecified: Secondary | ICD-10-CM | POA: Diagnosis present

## 2018-12-25 DIAGNOSIS — I10 Essential (primary) hypertension: Secondary | ICD-10-CM | POA: Diagnosis present

## 2018-12-25 DIAGNOSIS — F1290 Cannabis use, unspecified, uncomplicated: Secondary | ICD-10-CM | POA: Diagnosis present

## 2018-12-25 DIAGNOSIS — K648 Other hemorrhoids: Secondary | ICD-10-CM | POA: Diagnosis present

## 2018-12-25 DIAGNOSIS — Z79899 Other long term (current) drug therapy: Secondary | ICD-10-CM

## 2018-12-25 DIAGNOSIS — Z87891 Personal history of nicotine dependence: Secondary | ICD-10-CM

## 2018-12-25 DIAGNOSIS — Z800 Family history of malignant neoplasm of digestive organs: Secondary | ICD-10-CM

## 2018-12-25 DIAGNOSIS — K5090 Crohn's disease, unspecified, without complications: Secondary | ICD-10-CM | POA: Diagnosis present

## 2018-12-25 DIAGNOSIS — J45909 Unspecified asthma, uncomplicated: Secondary | ICD-10-CM | POA: Diagnosis present

## 2018-12-25 DIAGNOSIS — R109 Unspecified abdominal pain: Secondary | ICD-10-CM

## 2018-12-25 DIAGNOSIS — Z8249 Family history of ischemic heart disease and other diseases of the circulatory system: Secondary | ICD-10-CM

## 2018-12-25 LAB — LIPASE, BLOOD: Lipase: 62 U/L — ABNORMAL HIGH (ref 11–51)

## 2018-12-25 LAB — COMPREHENSIVE METABOLIC PANEL
ALT: 17 U/L (ref 0–44)
AST: 16 U/L (ref 15–41)
Albumin: 2.9 g/dL — ABNORMAL LOW (ref 3.5–5.0)
Alkaline Phosphatase: 66 U/L (ref 38–126)
Anion gap: 8 (ref 5–15)
BUN: 10 mg/dL (ref 6–20)
CO2: 26 mmol/L (ref 22–32)
Calcium: 8.9 mg/dL (ref 8.9–10.3)
Chloride: 101 mmol/L (ref 98–111)
Creatinine, Ser: 0.94 mg/dL (ref 0.61–1.24)
GFR calc Af Amer: >60 mL/min (ref >60)
GFR calc non Af Amer: >60 mL/min (ref >60)
Glucose, Bld: 105 mg/dL — ABNORMAL HIGH (ref 70–99)
Potassium: 3.9 mmol/L (ref 3.5–5.1)
Sodium: 135 mmol/L (ref 135–145)
Total Bilirubin: 0.5 mg/dL (ref 0.3–1.2)
Total Protein: 6.7 g/dL (ref 6.5–8.1)

## 2018-12-25 LAB — CBC
HCT: 40.9 % (ref 39.0–52.0)
Hemoglobin: 13.2 g/dL (ref 13.0–17.0)
MCH: 27.7 pg (ref 26.0–34.0)
MCHC: 32.3 g/dL (ref 30.0–36.0)
MCV: 85.9 fL (ref 80.0–100.0)
Platelets: 418 10*3/uL — ABNORMAL HIGH (ref 150–400)
RBC: 4.76 MIL/uL (ref 4.22–5.81)
RDW: 15.2 % (ref 11.5–15.5)
WBC: 15.7 10*3/uL — ABNORMAL HIGH (ref 4.0–10.5)
nRBC: 0 % (ref 0.0–0.2)

## 2018-12-25 MED ORDER — SODIUM CHLORIDE 0.9% FLUSH
3.0000 mL | Freq: Once | INTRAVENOUS | Status: AC
  Administered 2018-12-25: 3 mL via INTRAVENOUS

## 2018-12-25 MED ORDER — METOCLOPRAMIDE HCL 5 MG/ML IJ SOLN
10.0000 mg | INTRAMUSCULAR | Status: AC
  Administered 2018-12-25: 10 mg via INTRAVENOUS
  Filled 2018-12-25: qty 2

## 2018-12-25 MED ORDER — METHYLPREDNISOLONE SODIUM SUCC 125 MG IJ SOLR
125.0000 mg | Freq: Once | INTRAMUSCULAR | Status: AC
  Administered 2018-12-25: 125 mg via INTRAVENOUS
  Filled 2018-12-25: qty 2

## 2018-12-25 MED ORDER — FENTANYL CITRATE (PF) 100 MCG/2ML IJ SOLN
50.0000 ug | Freq: Once | INTRAMUSCULAR | Status: AC
  Administered 2018-12-25: 50 ug via INTRAVENOUS
  Filled 2018-12-25: qty 2

## 2018-12-25 MED ORDER — SODIUM CHLORIDE 0.9 % IV BOLUS
1000.0000 mL | Freq: Once | INTRAVENOUS | Status: AC
  Administered 2018-12-25: 1000 mL via INTRAVENOUS

## 2018-12-25 NOTE — ED Provider Notes (Signed)
[facility] EMERGENCY DEPARTMENT Provider Note   CSN: [id number] Arrival date & time: 12/25/18  2231    History   Chief Complaint Chief Complaint  Patient presents with  . Abdominal Pain    HPI Eric Castillo. is a 43 y.o. male.     43 y/o male with PMH is significant for SBO suspected due to Crohn's disease, tobacco use, asthma presents to the ED for evaluation of abdominal pain. Notes sudden onset of stabbing pain in his perineal region which lasted ~10 minutes before resolving. Was able to resume cooking dinner until pain returned 45 minutes later. It has since remained constant and has been gradually worsening, migrating into his upper abdomen. The patient initially felt a BM may improve his symptoms, but was unable to pass a bowel movement.  Did have a normal BM this AM.  Has been compliant with his prednisone since d/c from the hospital 3 days ago.  Denies fevers, N/V, melena, hematochezia, urinary symptoms.  No hx of abdominal surgeries.  Scheduled to follow up with Dr. Carlean Purl of GI on 01/17/19.  The history is provided by the patient. No language interpreter was used.  Abdominal Pain    Past Medical History:  Diagnosis Date  . Asthma   . Crohn's disease ([place])   . SBO (small bowel obstruction) [facility])     Patient Active Problem List   Diagnosis Date Noted  . Family history of colon cancer in father - 77's 12/21/2018  . Tobacco use   . Crohn's disease of ileum with intestinal obstruction ([place])   . SBO (small bowel obstruction) ([place]) 05/03/2018  . Asthma 05/03/2018    History reviewed. No pertinent surgical history.      Home Medications    Prior to Admission medications   Medication Sig Start Date End Date Taking? Authorizing Provider  albuterol (PROVENTIL HFA;VENTOLIN HFA) 108 (90 Base) MCG/ACT inhaler Inhale 1-2 puffs into the lungs every 6 (six) hours as needed for wheezing or shortness of breath.    [provider]  predniSONE  (DELTASONE) 10 MG tablet Take 4 tablets (40 mg total) by mouth daily for 6 days, THEN 3 tablets (30 mg total) daily for 7 days, THEN 2 tablets (20 mg total) daily for 14 days. 12/22/18 01/18/19  Martyn Malay, MD    Family History Family History  Problem Relation Age of Onset  . Crohn's disease Mother   . Heart attack Mother        In her late 11s.  This was the cause of her death.  . Colon cancer Father        In his mid-to-late 74s.  As of 2020, patient in his early 31s and cancer said to be in remission.    Social History Social History   Tobacco Use  . Smoking status: Current Every Day Smoker    Packs/day: 0.50    Types: Cigarettes  . Smokeless tobacco: Never Used  Substance Use Topics  . Alcohol use: Yes  . Drug use: Yes    Types: Marijuana     Allergies   Patient has no known allergies.   Review of Systems Review of Systems  Gastrointestinal: Positive for abdominal pain.  Ten systems reviewed and are negative for acute change, except as noted in the HPI.     Physical Exam Updated Vital Signs BP (!) 157/100   Pulse 93   Temp 98.6 F (37 C) (Oral)   Resp (!) 22  SpO2 100%   Physical Exam Vitals signs and nursing note reviewed.  Constitutional:      General: He is not in acute distress.    Appearance: He is well-developed. He is not diaphoretic.     Comments: Patient appears uncomfortable.  HENT:     Head: Normocephalic and atraumatic.  Eyes:     General: No scleral icterus.    Conjunctiva/sclera: Conjunctivae normal.  Neck:     Musculoskeletal: Normal range of motion.  Cardiovascular:     Rate and Rhythm: Normal rate and regular rhythm.     Pulses: Normal pulses.  Pulmonary:     Effort: Pulmonary effort is normal. No respiratory distress.     Comments: Respirations even and unlabored Abdominal:     Comments: Hypoactive bowel sounds. Guarding throughout the abdomen with generalized TTP. No palpable masses.  Musculoskeletal: Normal range of  motion.  Skin:    General: Skin is warm and dry.     Coloration: Skin is not pale.     Findings: No erythema or rash.  Neurological:     Mental Status: He is alert and oriented to person, place, and time.     Coordination: Coordination normal.     Comments: GCS 15. Moving all extremities spontaneously.  Psychiatric:        Behavior: Behavior normal.      ED Treatments / Results  Labs (all labs ordered are listed, but only abnormal results are displayed) Labs Reviewed  LIPASE, BLOOD - Abnormal; Notable for the following components:      Result Value   Lipase 62 (*)    All other components within normal limits  COMPREHENSIVE METABOLIC PANEL - Abnormal; Notable for the following components:   Glucose, Bld 105 (*)    Albumin 2.9 (*)    All other components within normal limits  CBC - Abnormal; Notable for the following components:   WBC 15.7 (*)    Platelets 418 (*)    All other components within normal limits  C-REACTIVE PROTEIN - Abnormal; Notable for the following components:   CRP 5.8 (*)    All other components within normal limits  URINALYSIS, ROUTINE W REFLEX MICROSCOPIC  LACTIC ACID, PLASMA    EKG None  Radiology Ct Abdomen Pelvis W Contrast  Result Date: 12/26/2018 CLINICAL DATA:  43 year old male with Persistent abdominal pain, abnormal terminal ileum on recent CT Abdomen and Pelvis. Crohn disease. EXAM: CT ABDOMEN AND PELVIS WITH CONTRAST TECHNIQUE: Multidetector CT imaging of the abdomen and pelvis was performed using the standard protocol following bolus administration of intravenous contrast. CONTRAST:  176m OMNIPAQUE IOHEXOL 300 MG/ML  SOLN COMPARISON:  CT Abdomen and Pelvis 12/20/2018 FINDINGS: Lower chest: Mild lung base atelectasis. No pericardial or pleural effusion. Hepatobiliary: Stable liver.  Negative gallbladder. Pancreas: Negative. Spleen: Negative. Adrenals/Urinary Tract: Normal adrenal glands. Bilateral renal enhancement and contrast excretion is  symmetric and within normal limits. Diminutive and unremarkable urinary bladder. Stomach/Bowel: Rectosigmoid colon is decompressed today. There is confluent mesenteric inflammation in the pelvis although this appears more related to the distal small bowel. Similarly decompressed left colon. The transverse colon and right colon have a more normal appearance with retained stool. From the ileocecal valve there is generalized thickening and indistinctness of the terminal ileum with upstream dilated and inflamed distal small bowel with confluent mesenteric stranding. See series 3 images 67-77. The upstream small bowel is dilated up to 43 millimeters diameter and progressed from the recent CT. There is an abrupt transition from  the most dilated loop into decompressed upstream small bowel. However, there is intermittent fluid-filled, thickened and hyperenhancing small bowel in the left abdomen. Similar distension of the proximal stomach with negative duodenum. No free air. Small volume mesenteric free fluid suspected and increased. Vascular/Lymphatic: Major arterial structures are patent and normal. Portal venous system is patent. No lymphadenopathy. Reproductive: Negative. Other: Trace if any pelvic free fluid. Musculoskeletal: No acute osseous abnormality identified. IMPRESSION: Widespread inflamed small bowel compatible with exacerbation of Crohn disease which has progressed since the CT on 12/20/2018. Confluent distal small bowel inflammation to the TI with increasingly dilated ileum up to 43 mm diameter. Small volume of free fluid.  No abscess or free air identified. Electronically Signed   By: Genevie Ann M.D.   On: 12/26/2018 01:31   Dg Abd 2 Views  Result Date: 12/26/2018 CLINICAL DATA:  43 year old male with abdominal pain. EXAM: ABDOMEN - 2 VIEW COMPARISON:  CT Abdomen and Pelvis 12/20/2018 and earlier. FINDINGS: Upright and supine views of the abdomen and pelvis. No pneumoperitoneum. Negative visible lung bases.  Non obstructed bowel gas pattern with increased gas at the splenic flexure from the recent CT. Pelvic phleboliths redemonstrated. Stable abdominal visceral contours. No acute osseous abnormality identified. IMPRESSION: Non obstructed bowel gas pattern.  No free air. Electronically Signed   By: Genevie Ann M.D.   On: 12/26/2018 00:16    Procedures Procedures (including critical care time)  Medications Ordered in ED Medications  sodium chloride flush (NS) 0.9 % injection 3 mL (3 mLs Intravenous Given 12/25/18 2334)  sodium chloride 0.9 % bolus 1,000 mL (0 mLs Intravenous Stopped 12/26/18 0026)  metoCLOPramide (REGLAN) injection 10 mg (10 mg Intravenous Given 12/25/18 2334)  methylPREDNISolone sodium succinate (SOLU-MEDROL) 125 mg/2 mL injection 125 mg (125 mg Intravenous Given 12/25/18 2334)  fentaNYL (SUBLIMAZE) injection 50 mcg (50 mcg Intravenous Given 12/25/18 2334)  HYDROmorphone (DILAUDID) injection 1 mg (1 mg Intravenous Given 12/26/18 0027)  iohexol (OMNIPAQUE) 300 MG/ML solution 100 mL (100 mLs Intravenous Contrast Given 12/26/18 0041)    Initial Impression / Assessment and Plan / ED Course  I have reviewed the triage vital signs and the nursing notes.  Pertinent labs & imaging results that were available during my care of the patient were reviewed by me and considered in my medical decision making (see chart for details).         29:25 AM 43 year old male with presumed Crohn's presents to the ED for acute onset of abdominal pain which began at 2000 tonight.  History of 3-day admission with discharge on 12/22/2018 for management of Crohn's flare.  He has been using home prednisone.  Reports compliance with this medication.  No fever, nausea, vomiting, melena or hematochezia.  He has a generally tender abdomen with guarding.  Will obtain labs, x-ray.  Patient ordered to receive Solu-Medrol, fentanyl, Reglan for symptoms.  Will hydrate.  12:35 AM Abdominal exam stable. GU exam completed by MD  [place] without abnormality. Xray without acute findings. Will proceed with CT given degree of discomfort and guarding.  1:42 AM Patient states that pain is better controlled at this time.  Declines additional pain medication.  He is agreeable to admission given worsening flare despite outpatient use of prednisone.  2:05 AM Family medicine to evaluate in the ED. FM service requesting ED consult to GI for recommendations on management. Consult placed to [place].  2:17 AM Dr. Henrene Pastor of GI is comfortable with ongoing use of IV steroids.  They will consult  on patient case in the morning.   Final Clinical Impressions(s) / ED Diagnoses   Final diagnoses:  Abdominal pain  Exacerbation of Crohn's disease with complication [facility])    ED Discharge Orders    None       Antonietta Breach, PA-C 12/26/18 1275    Ripley Fraise, MD 12/26/18 0221

## 2018-12-25 NOTE — ED Triage Notes (Signed)
Per EMS, Sudden onset of Generalized abdominal pain. Pt was just discharged from the hospital for same.  Also complains of nausea.  170/120, pulse 60.  98% RA.  Temp 99.5

## 2018-12-25 NOTE — ED Notes (Signed)
Patient transported to X-ray 

## 2018-12-26 ENCOUNTER — Emergency Department (HOSPITAL_COMMUNITY): Payer: Self-pay

## 2018-12-26 ENCOUNTER — Encounter (HOSPITAL_COMMUNITY): Payer: Self-pay | Admitting: Family Medicine

## 2018-12-26 DIAGNOSIS — D72829 Elevated white blood cell count, unspecified: Secondary | ICD-10-CM

## 2018-12-26 DIAGNOSIS — K56609 Unspecified intestinal obstruction, unspecified as to partial versus complete obstruction: Secondary | ICD-10-CM

## 2018-12-26 DIAGNOSIS — R110 Nausea: Secondary | ICD-10-CM

## 2018-12-26 DIAGNOSIS — R1084 Generalized abdominal pain: Secondary | ICD-10-CM

## 2018-12-26 DIAGNOSIS — K5080 Crohn's disease of both small and large intestine without complications: Secondary | ICD-10-CM

## 2018-12-26 DIAGNOSIS — R109 Unspecified abdominal pain: Secondary | ICD-10-CM | POA: Insufficient documentation

## 2018-12-26 DIAGNOSIS — K50919 Crohn's disease, unspecified, with unspecified complications: Secondary | ICD-10-CM

## 2018-12-26 DIAGNOSIS — K5090 Crohn's disease, unspecified, without complications: Secondary | ICD-10-CM | POA: Diagnosis present

## 2018-12-26 LAB — CBC WITH DIFFERENTIAL/PLATELET
Abs Immature Granulocytes: 0 10*3/uL (ref 0.00–0.07)
Basophils Absolute: 0 10*3/uL (ref 0.0–0.1)
Basophils Relative: 0 %
Eosinophils Absolute: 0 10*3/uL (ref 0.0–0.5)
Eosinophils Relative: 0 %
HCT: 38.5 % — ABNORMAL LOW (ref 39.0–52.0)
Hemoglobin: 12.5 g/dL — ABNORMAL LOW (ref 13.0–17.0)
Lymphocytes Relative: 1 %
Lymphs Abs: 0.3 10*3/uL — ABNORMAL LOW (ref 0.7–4.0)
MCH: 27.6 pg (ref 26.0–34.0)
MCHC: 32.5 g/dL (ref 30.0–36.0)
MCV: 85 fL (ref 80.0–100.0)
Monocytes Absolute: 1 10*3/uL (ref 0.1–1.0)
Monocytes Relative: 3 %
Neutro Abs: 30.4 10*3/uL — ABNORMAL HIGH (ref 1.7–7.7)
Neutrophils Relative %: 96 %
Platelets: 421 10*3/uL — ABNORMAL HIGH (ref 150–400)
RBC: 4.53 MIL/uL (ref 4.22–5.81)
RDW: 15.3 % (ref 11.5–15.5)
WBC: 31.7 10*3/uL — ABNORMAL HIGH (ref 4.0–10.5)
nRBC: 0 % (ref 0.0–0.2)
nRBC: 0 /100 WBC

## 2018-12-26 LAB — BASIC METABOLIC PANEL
Anion gap: 9 (ref 5–15)
BUN: 9 mg/dL (ref 6–20)
CO2: 27 mmol/L (ref 22–32)
Calcium: 8.9 mg/dL (ref 8.9–10.3)
Chloride: 101 mmol/L (ref 98–111)
Creatinine, Ser: 0.74 mg/dL (ref 0.61–1.24)
GFR calc Af Amer: >60 mL/min (ref >60)
GFR calc non Af Amer: >60 mL/min (ref >60)
Glucose, Bld: 124 mg/dL — ABNORMAL HIGH (ref 70–99)
Potassium: 3.7 mmol/L (ref 3.5–5.1)
Sodium: 137 mmol/L (ref 135–145)

## 2018-12-26 LAB — URINALYSIS, ROUTINE W REFLEX MICROSCOPIC
Bilirubin Urine: NEGATIVE
Glucose, UA: NEGATIVE mg/dL
Hgb urine dipstick: NEGATIVE
Ketones, ur: NEGATIVE mg/dL
Leukocytes,Ua: NEGATIVE
Nitrite: NEGATIVE
Protein, ur: NEGATIVE mg/dL
Specific Gravity, Urine: 1.019 (ref 1.005–1.030)
pH: 5 (ref 5.0–8.0)

## 2018-12-26 LAB — LACTIC ACID, PLASMA: Lactic Acid, Venous: 1.5 mmol/L (ref 0.5–1.9)

## 2018-12-26 LAB — C-REACTIVE PROTEIN: CRP: 5.8 mg/dL — ABNORMAL HIGH (ref <1.0)

## 2018-12-26 MED ORDER — HYDROMORPHONE HCL 1 MG/ML IJ SOLN
1.0000 mg | Freq: Once | INTRAMUSCULAR | Status: AC
  Administered 2018-12-26: 1 mg via INTRAVENOUS
  Filled 2018-12-26: qty 1

## 2018-12-26 MED ORDER — METHYLPREDNISOLONE SODIUM SUCC 40 MG IJ SOLR
40.0000 mg | Freq: Every day | INTRAMUSCULAR | Status: DC
  Administered 2018-12-27 – 2018-12-29 (×3): 40 mg via INTRAVENOUS
  Filled 2018-12-26 (×2): qty 1

## 2018-12-26 MED ORDER — HYDROCHLOROTHIAZIDE 25 MG PO TABS
25.0000 mg | ORAL_TABLET | Freq: Every day | ORAL | Status: DC
  Administered 2018-12-26 – 2018-12-27 (×2): 25 mg via ORAL
  Filled 2018-12-26 (×2): qty 1

## 2018-12-26 MED ORDER — IOHEXOL 300 MG/ML  SOLN
100.0000 mL | Freq: Once | INTRAMUSCULAR | Status: AC | PRN
  Administered 2018-12-26: 100 mL via INTRAVENOUS

## 2018-12-26 MED ORDER — KETOROLAC TROMETHAMINE 30 MG/ML IJ SOLN: 30.0000 mg | Freq: Four times a day (QID) | INTRAMUSCULAR | Status: DC | PRN

## 2018-12-26 MED ORDER — NON FORMULARY: 5.0000 mg | Freq: Every evening | Status: DC | PRN

## 2018-12-26 MED ORDER — ENOXAPARIN SODIUM 40 MG/0.4ML ~~LOC~~ SOLN
40.0000 mg | Freq: Every day | SUBCUTANEOUS | Status: DC
  Administered 2018-12-26: 09:00:00 40 mg via SUBCUTANEOUS
  Filled 2018-12-26 (×2): qty 0.4

## 2018-12-26 MED ORDER — PREDNISONE 20 MG PO TABS
40.0000 mg | ORAL_TABLET | Freq: Every day | ORAL | Status: DC
  Administered 2018-12-26: 40 mg via ORAL
  Filled 2018-12-26: qty 2

## 2018-12-26 MED ORDER — MORPHINE SULFATE (PF) 2 MG/ML IV SOLN
1.0000 mg | INTRAVENOUS | Status: DC | PRN
  Administered 2018-12-26 – 2018-12-28 (×7): 1 mg via INTRAVENOUS
  Filled 2018-12-26 (×7): qty 1

## 2018-12-26 MED ORDER — METRONIDAZOLE IN NACL 5-0.79 MG/ML-% IV SOLN
500.0000 mg | Freq: Three times a day (TID) | INTRAVENOUS | Status: DC
  Administered 2018-12-26 – 2018-12-29 (×10): 500 mg via INTRAVENOUS
  Filled 2018-12-26 (×9): qty 100

## 2018-12-26 MED ORDER — MELATONIN 3 MG PO TABS
3.0000 mg | ORAL_TABLET | Freq: Every day | ORAL | Status: DC
  Administered 2018-12-26 – 2018-12-29 (×3): 3 mg via ORAL
  Filled 2018-12-26 (×5): qty 1

## 2018-12-26 MED ORDER — CIPROFLOXACIN IN D5W 400 MG/200ML IV SOLN
400.0000 mg | Freq: Two times a day (BID) | INTRAVENOUS | Status: DC
  Administered 2018-12-26 – 2018-12-29 (×7): 400 mg via INTRAVENOUS
  Filled 2018-12-26 (×7): qty 200

## 2018-12-26 MED ORDER — SODIUM CHLORIDE 0.9 % IV SOLN
INTRAVENOUS | Status: AC
  Administered 2018-12-26: 12:00:00 via INTRAVENOUS

## 2018-12-26 NOTE — H&P (Addendum)
[facility] Admission History and Physical Service Pager: [phone]  Patient name: Eric Castillo. Medical record number: [id number] Date of birth: 11/02/1975 Age: 43 y.o. Gender: male  Primary Care Provider: Patient, No Pcp Per Consultants: GI Code Status: Full Preferred Emergency Contact :    Contact Information    Name Relation Home Work [place] Father   [phone]     Chief Complaint: Abdominal Pain   Assessment and Plan: Eric Xia. is a 43 y.o. male admitted for abdominal pain.  His chronic conditions include Crohn's disease, tobacco use and ashtma.    # Chrohn's Flair, abdominal pain Patient recently admitted from 4/22 to 4/23 for abdominal pain in setting of SBO on CT scan, likely 2/2 Crohn's disease. Patient was started on prednisone taper (40 mg daily for 1 week, 30 mg daily for 1 week, then 20 mg daily until 5/19), had improved abdominal pain after passing flatus and having BMs. He was scheduled for follow up with GI outpatient (5/19).  Patient presents with acute abdominal pain that started at 2000 on 4/26. States that pain is similar to what his Crohn's flairs feel like. WBC in ED is 15.7, up from 10.6 on 4/23; however, likely due to prednisone use rather than acute infection given patient is afebrile with normal lactic acid. Lipase elevated from previous admission, at 62 from 33.  Will admit for fluids and pain control, and will follow up on GI plans who will see in the morning.  S/p solumedrol in ED. Pain well controlled in the ED with dilaudid. Will continue IV pain medications given inflammation of bowel. Will plan to continue PO prednisone vs. Solumedrol per GI recs, if resistant can consider immunomodulator vs. Surgical intervention.  . Admit to FMTS, attending Dr. McDiarmid. Level of care: Med surg.  . Vitals per unit routine . GI recs appreciated. To see in the morning.  . NPO  . PRN IV morphine 3m q4h, escalate  PRN . CBC w/ diff & BMP . Avoid NSAIDS . Can consider PO prednisone vs. IV Solumedrol pending GI recs, patient is s/p solumedrol in ED so will not need further steroids tonight. PO prednisone set to start at 10:00AM.   #Pain in perineum  Patient reports that this pain is different from diffuse abdominal pain that is associated with Crohn's flairs. Patient reports that pain is transient and occurs randomly. He reports it is around the area "where you would use to hold a bowel movement or to pee". He didn't have pain with a bowel movement or with urination earlier today, but did experience the perineum pain with urination recently while in the hospital. Patient reports that pain is relieved with pressure on the perineum when occurring. However, palpation to the area on exam elicits pain. Testicles and penis normal on exam. Internal hemorrhoid palpated at 6 o'clock.  Differential for pain includes prostatitis--however, prostate exam with negative chandelier sign, no nodularity or abnormality of prostate wall. There are no signs of abscess or structural causes in CT abdomen pelvis, though stone is not ruled out as CT AP only performed with contrast. Can consider renal stone given radiating pain from abdomen to groin. Stone less likely as no hematuria on urinalysis and no stone noted on CT from 4/21. More concerning for peri-rectal abscess, a common complication of Crohn's disease; however, not seen on CT and not palpated on exam. Unlikely testicular torsion or epididymitis given normal testicular exam (by both Dr. KMaudie Mercuryand  Dr. Christy Gentles). No h/o trauma. No hernia on exam.   PRN pain management as above  Monitor white count and for fever   Follow up GI recommendations  Can consider renal stone study if no improvement    GC/chlamydia   Urine culture   #Hypertension  Hypertensive to 150-160's. Could be due to pain as patient not formally diagnosed with HTN per chart, though patient does endorse family  history and that brother passed away suddenly from high blood pressure. Appears to have been normotensive during last admission, elevated to 146/95 during admission only.   Continue to monitor  Start antihypertensive PRN w/ outpatient follow up   Needs PCP - Can schedule with Dr. Maudie Mercury or Dr. Tammi Klippel at discharge  # Tobacco use  Smokes 1/2 PPD. Denies current use, only uses marijuana   Nicotine patch PRN   # Asthma, well controlled  No controller medications   Albuterol PRN  #FEN/GI:  . Fluids: s/p 1L bolus in ED  . Electrolytes: wnl . Nutrition: NPO  Access: PIV VTE prophylaxis: Lovenox q24hrs  Disposition: Admit to med surg.  ------------------------------------------------------------------------------------------------------- HPI Eric Roselle. is a 43 y.o. male with past medical history significant for Crohn's disease, diagnosed in September 2019, who presents with worsening abdominal pain that occurred around 8pm today. Of note, patient was recently admitted for Crohn's Flair induced SBO on 4/22 to 4/23, seen by GI and discharged on prednisone and GI follow up. No evidence of obstruction on CT despite worsening pain. Patient was using prednisone taper provided upon discharge by GI on discharge on 12/22/18.   Patient also reports sharp pain that radiates down to testicles. Pain began at 8PM while making dinner. Pain is improved with pressing on perineum. This is new for him. Normally pain stays in stomach but now pain is radiating to testicles. When asked to further explain location of pain patient reports pain is actually posterior to testicles, around groin. Denies pain with bowel movements or urination, but has started having said pain with urination since arriving to the hospital. Patient reports it hurts in the area that you would use to hold in a bowel movement or to void. Patient is sexually active with fiance. Last unprotected intercourse was yesterday.   In the ED,  patient provided  With 1 L bolus NS, Reglan, 125 mg solumedrol, fentanyl and dilaudid for pain. GI was consulted in the ED who recommended IV steroids and further follow up in the morning.   Abnormal Labs Reviewed  LIPASE, BLOOD - Abnormal; Notable for the following components:      Result Value   Lipase 62 (*)    All other components within normal limits  COMPREHENSIVE METABOLIC PANEL - Abnormal; Notable for the following components:   Glucose, Bld 105 (*)    Albumin 2.9 (*)    All other components within normal limits  CBC - Abnormal; Notable for the following components:   WBC 15.7 (*)    Platelets 418 (*)    All other components within normal limits  C-REACTIVE PROTEIN - Abnormal; Notable for the following components:   CRP 5.8 (*)    All other components within normal limits    Review Of Systems: Review of Systems  Constitutional: Negative for chills and fever.  Respiratory: Negative for shortness of breath.   Cardiovascular: Negative for chest pain.  Gastrointestinal: Positive for abdominal pain and nausea.  Genitourinary: Negative for dysuria and flank pain.  Neurological: Negative for dizziness and headaches.  Patient Active Problem List   Diagnosis Date Noted  . Exacerbation of Crohn's disease with complication ([place]) 30/16/0109  . Abdominal pain   . Family history of colon cancer in father - 41's 12/21/2018  . Tobacco use   . Crohn's disease of ileum with intestinal obstruction ([place])   . SBO (small bowel obstruction) ([place]) 05/03/2018  . Asthma 05/03/2018   Past Medical History: Past Medical History:  Diagnosis Date  . Asthma   . Crohn's disease ([place])   . SBO (small bowel obstruction) ([facility])     Past Surgical History: History reviewed. No pertinent surgical history.  Family History: family history includes Colon cancer in his father; Crohn's disease in his mother; Heart attack in his mother.   Social History: Social History   Social History Narrative    Married   Therapist, music   [employer] grad + 1 year college   + ETOH, marijuana and smoker    Roque reports that he has been smoking cigarettes. He has been smoking about 0.50 packs per day. He has never used smokeless tobacco. He reports current alcohol use. He reports current drug use. Drug: Marijuana.  Allergies and Medications: No Known Allergies Current Meds  Medication Sig  . albuterol (PROVENTIL HFA;VENTOLIN HFA) 108 (90 Base) MCG/ACT inhaler Inhale 1-2 puffs into the lungs every 6 (six) hours as needed for wheezing or shortness of breath.  . predniSONE (DELTASONE) 10 MG tablet Take 4 tablets (40 mg total) by mouth daily for 6 days, THEN 3 tablets (30 mg total) daily for 7 days, THEN 2 tablets (20 mg total) daily for 14 days.    Objective: BP (!) 153/91   Pulse 77   Temp 98.6 F (37 C) (Oral)   Resp 18   SpO2 95%   Exam: Physical Exam Constitutional:      General: He is not in acute distress.    Appearance: He is well-developed and normal weight. He is not ill-appearing.  HENT:     Head: Normocephalic and atraumatic.     Mouth/Throat:     Mouth: Mucous membranes are moist.     Pharynx: Oropharynx is clear.  Eyes:     Extraocular Movements: Extraocular movements intact.     Pupils: Pupils are equal, round, and reactive to light.  Cardiovascular:     Rate and Rhythm: Normal rate and regular rhythm.     Heart sounds: Normal heart sounds. No murmur.  Pulmonary:     Effort: Pulmonary effort is normal.     Breath sounds: Normal breath sounds.  Abdominal:     General: Abdomen is flat. Bowel sounds are normal.     Palpations: There is no mass.     Tenderness: There is generalized abdominal tenderness. There is guarding. There is no right CVA tenderness or left CVA tenderness.     Hernia: No hernia is present.  Genitourinary:    Penis: Normal.      Scrotum/Testes: Normal.        Right: Mass, tenderness or swelling not present.        Left: Mass, tenderness or  swelling not present.     Prostate: Normal. Not enlarged and not tender.  Skin:    General: Skin is warm and dry.     Capillary Refill: Capillary refill takes less than 2 seconds.  Neurological:     General: No focal deficit present.     Mental Status: He is alert and oriented to person, place, and time.  Psychiatric:  Mood and Affect: Mood normal.        Behavior: Behavior normal.      Labs and Imaging: I have personally reviewed following labs and imaging studies CBC: Recent Labs  Lab 12/20/18 2133 12/21/18 0257 12/22/18 0424 12/25/18 2239  WBC 9.2 9.9 10.6* 15.7*  NEUTROABS 5.6  --   --   --   HGB 11.3* 11.3* 11.8* 13.2  HCT 35.6* 34.9* 36.2* 40.9  MCV 86.8 86.4 84.8 85.9  PLT 355 380 400 418*   CMP: Recent Labs  Lab 12/20/18 2133 12/21/18 0257 12/22/18 0424 12/25/18 2239  NA 140 141 138 135  K 3.3* 3.8 3.8 3.9  CL 106 108 106 101  CO2 26 24 22 26   GLUCOSE 99 90 101* 105*  BUN 8 7 6 10   CREATININE 0.89 0.92 0.84 0.94  CALCIUM 8.7* 8.4* 9.0 8.9  ALBUMIN 2.6*  --   --  2.9*     GFR: CrCl cannot be calculated (Unknown ideal weight.).  Liver Function Tests: Recent Labs  Lab 12/20/18 2133 12/25/18 2239  AST 12* 16  ALT 11 17  ALKPHOS 55 66  BILITOT 0.5 0.5  PROT 5.9* 6.7  ALBUMIN 2.6* 2.9*   Recent Labs  Lab 12/20/18 2133 12/25/18 2239  LIPASE 33 62*   Urine analysis: Recent Labs    12/26/18 0032  COLORURINE YELLOW  APPEARANCEUR CLEAR  LABSPEC 1.019  PHURINE 5.0  GLUCOSEU NEGATIVE  HGBUR NEGATIVE  BILIRUBINUR NEGATIVE  KETONESUR NEGATIVE  PROTEINUR NEGATIVE  NITRITE NEGATIVE  LEUKOCYTESUR NEGATIVE   Imaging/Diagnostic Tests: Ct Abdomen Pelvis W Contrast Result Date: 12/26/2018  IMPRESSION: Widespread inflamed small bowel compatible with exacerbation of Crohn disease which has progressed since the CT on 12/20/2018. Confluent distal small bowel inflammation to the TI with increasingly dilated ileum up to 43 mm diameter. Small  volume of free fluid.  No abscess or free air identified.   Dg Abd 2 Views Result Date: 12/26/2018 IMPRESSION: Non obstructed bowel gas pattern.  No free air.   Wilber Oliphant, M.D. 12/26/2018, 3:36 AM PGY-1, [place] Intern pager: [phone], text pages welcome  FPTS Upper-Level Resident Addendum   I have independently interviewed and examined the patient. I have discussed the above with the original author and agree with their documentation. My edits for correction/addition/clarification are in blue. Please see also any attending notes.   Caroline More, DO PGY-2, [facility] 12/26/2018 5:11 AM  FPTS Service pager: [phone](NLZJ pages welcome through [place])

## 2018-12-26 NOTE — ED Notes (Signed)
Patient transported to CT 

## 2018-12-26 NOTE — Progress Notes (Signed)
Family Medicine Teaching Service Daily Progress Note Intern Pager: [phone]  Patient name: Eric Castillo. Medical record number: [id number] Date of birth: [birth year] Age: 43 y.o. Gender: male  Primary Care Provider: Patient, No Pcp Per Consultants: GI Code Status: FULL  Pt Overview and Major Events to Date:  4/26 admitted with recurrent abd pain, recently discharged for similar Chrons flare, GI consulted  Assessment and Plan: Eric Castillo. is a 43 y.o. male admitted for abdominal pain.  His chronic conditions include Crohn's disease, tobacco use and ashtma.    Chrohn's Flair, abdominal pain Patient recently admitted from 4/22 to 4/23 for abdominal pain in setting of SBO on CT scan, likely 2/2 Crohn's disease. S/p solumedrol in ED. Will continue IV pain medications given inflammation of bowel.  -Vitals per unit routine -GI recs appreciated. To see in the morning.  -NPO  -PRN IV morphine 27m q4h, escalate PRN -CBC w/ diff & BMP -Avoid NSAIDS -Can consider PO prednisone vs. IV Solumedrol pending GI recs, patient is s/p solumedrol in ED so will not need further steroids tonight. PO prednisone set to start at 10:00AM.   Pain in perineum- unclear etiology, previous exams not c/w prostatitis or testicular abnormalities. Question pelvic floor musculature dysfunction or referred pain as above. Reports sexual activity with male fiance, no others.  -PRN pain management as above -Monitor white count and for fever  -Follow up GI recommendations -GC/chlamydia  -Urine culture   Family hx of early CAD/sudden death: patient reports his brother passed from sudden death in S10-11-19 He was 42years old, previously healthy. Pt reports an autopsy was conducted, his father has the information. When asked about family hx of CAD, pt reports father has had multiple MIs with stents, as early as age 10315he thinks. Mom also passed of an MI per GI note. -needs outpatient cards eval, added this to dc  summ -EKG -lipid panel in am  -echo if LVH on EKG  Hypertension  Hypertensive to 140-160's. No outpatient regimen. Could be exacerbated by prednisone and pain.  -add HCTZ 265m Tobacco use  Smokes 1/2 PPD. Denies current use, only uses marijuana  -Nicotine patch PRN   Asthma, well controlled  No controller medications  -Albuterol PRN  FEN/GI:  -Fluids: s/p 1L bolus in ED  -Electrolytes: wnl -Nutrition: NPO  Access: PIV VTE prophylaxis: Lovenox q24hrs  Disposition: pending GI recs  Subjective:  Pt reports some improvement in abd pain. Perineal pain still coming and going. No urgency of bowel or bladder. Has questions about the etiology and ongoing management of Chrons disease. See above note about fam hx of CAD.   Objective: Temp:  [97.8 F (36.6 C)-98.6 F (37 C)] 97.8 F (36.6 C) (04/27 0358) Pulse Rate:  [74-93] 76 (04/27 0358) Resp:  [15-22] 16 (04/27 0358) BP: (145-160)/(91-111) 145/93 (04/27 0358) SpO2:  [94 %-100 %] 97 % (04/27 0358) Weight:  [60.9 kg] 60.9 kg (04/27 0358) Physical Exam: General: thin male resting in bed in NAD.  Cardiovascular: RRR, no murmur Respiratory: CTAB, no increased WOB Abdomen: soft, nondistended, mildly tender diffusely.  Extremities: no edema, warm.  Laboratory: Recent Labs  Lab 12/22/18 0424 12/25/18 2239 12/26/18 0407  WBC 10.6* 15.7* 31.7*  HGB 11.8* 13.2 12.5*  HCT 36.2* 40.9 38.5*  PLT 400 418* 421*   Recent Labs  Lab 12/20/18 2133 12/21/18 0257 12/22/18 0424 12/25/18 2239  NA 140 141 138 135  K 3.3* 3.8 3.8 3.9  CL 106 108 106  101  CO2 26 24 22 26   BUN 8 7 6 10   CREATININE 0.89 0.92 0.84 0.94  CALCIUM 8.7* 8.4* 9.0 8.9  PROT 5.9*  --   --  6.7  BILITOT 0.5  --   --  0.5  ALKPHOS 55  --   --  66  ALT 11  --   --  17  AST 12*  --   --  16  GLUCOSE 99 90 101* 105*    Imaging/Diagnostic Tests: Ct Abdomen Pelvis W Contrast  Result Date: 12/26/2018 CLINICAL DATA:  43 year old male with Persistent  abdominal pain, abnormal terminal ileum on recent CT Abdomen and Pelvis. Crohn disease. EXAM: CT ABDOMEN AND PELVIS WITH CONTRAST TECHNIQUE: Multidetector CT imaging of the abdomen and pelvis was performed using the standard protocol following bolus administration of intravenous contrast. CONTRAST:  13m OMNIPAQUE IOHEXOL 300 MG/ML  SOLN COMPARISON:  CT Abdomen and Pelvis 12/20/2018 FINDINGS: Lower chest: Mild lung base atelectasis. No pericardial or pleural effusion. Hepatobiliary: Stable liver.  Negative gallbladder. Pancreas: Negative. Spleen: Negative. Adrenals/Urinary Tract: Normal adrenal glands. Bilateral renal enhancement and contrast excretion is symmetric and within normal limits. Diminutive and unremarkable urinary bladder. Stomach/Bowel: Rectosigmoid colon is decompressed today. There is confluent mesenteric inflammation in the pelvis although this appears more related to the distal small bowel. Similarly decompressed left colon. The transverse colon and right colon have a more normal appearance with retained stool. From the ileocecal valve there is generalized thickening and indistinctness of the terminal ileum with upstream dilated and inflamed distal small bowel with confluent mesenteric stranding. See series 3 images 67-77. The upstream small bowel is dilated up to 43 millimeters diameter and progressed from the recent CT. There is an abrupt transition from the most dilated loop into decompressed upstream small bowel. However, there is intermittent fluid-filled, thickened and hyperenhancing small bowel in the left abdomen. Similar distension of the proximal stomach with negative duodenum. No free air. Small volume mesenteric free fluid suspected and increased. Vascular/Lymphatic: Major arterial structures are patent and normal. Portal venous system is patent. No lymphadenopathy. Reproductive: Negative. Other: Trace if any pelvic free fluid. Musculoskeletal: No acute osseous abnormality identified.  IMPRESSION: Widespread inflamed small bowel compatible with exacerbation of Crohn disease which has progressed since the CT on 12/20/2018. Confluent distal small bowel inflammation to the TI with increasingly dilated ileum up to 43 mm diameter. Small volume of free fluid.  No abscess or free air identified. Electronically Signed   By: HGenevie AnnM.D.   On: 12/26/2018 01:31   Dg Abd 2 Views  Result Date: 12/26/2018 CLINICAL DATA:  43year old male with abdominal pain. EXAM: ABDOMEN - 2 VIEW COMPARISON:  CT Abdomen and Pelvis 12/20/2018 and earlier. FINDINGS: Upright and supine views of the abdomen and pelvis. No pneumoperitoneum. Negative visible lung bases. Non obstructed bowel gas pattern with increased gas at the splenic flexure from the recent CT. Pelvic phleboliths redemonstrated. Stable abdominal visceral contours. No acute osseous abnormality identified. IMPRESSION: Non obstructed bowel gas pattern.  No free air. Electronically Signed   By: HGenevie AnnM.D.   On: 12/26/2018 00:16     TSela Hilding MD 12/26/2018, 8:11 AM PGY-3, [place]Passamaquod[place] Pleasant PointIntern pager: [phone] text pages welcome

## 2018-12-26 NOTE — Consult Note (Addendum)
[facility] Consult: 9:22 AM 12/26/2018  LOS: 0 days    Referring Provider: Dr Owens Shark  Primary Care Physician:  Patient, No Pcp Per Primary Gastroenterologist:  Althia Forts.      Reason for Consultation:  Crohn's flare   HPI: Eric Laur. is a 43 y.o. male.  Hx bronchiolitis, bronchitis.  Intestinal GI issues began in fall 2019 when he was in [place].  At that time he was unable to tolerate bowel prep so planned colonoscopy was canceled.Eric Castillo hospital admission soon after in 05/2018 with recurrent nausea, vomiting, abdominal pain, CT showing small bowel obstruction with transition point in the terminal ileum, concerning for Crohn's disease.  There was an incidental cyst in the liver, felt to be hemangioma.  The bowel obstruction was managed with NG tube.  ESR 46, CRP 66, both elevated.  QuantiFERON gold, hepatitis B surface antigen both negative in anticipation of requiring future Biologics, immunosuppressant medications.  Discharged on a 30-day prednisone taper beginning at 40 mg and was to complete a total of 10 days of Cipro, Flagyl.  He has no insurance, he did not follow-up with Dr. Therisa Doyne at [place] for planned colonoscopy.  Patient readmitted 12/20/2018 - 12/22/18 with recurrent SBO versus PSBO, 3 days abdominal pain nausea without vomiting, no BMs.  Prior to that he actually had been doing well. CT scan showed inflammation/thickening in the TI.  Associated obstruction pattern with mild dilation, fluid, stool in region proximal to the inflammation.  Within 36 hours, after starting Prednisone and receiving one dose Solumedrol, he was passing stool, tolerating p.o.,  feeling much better and was discharged.  Dr. Arelia Longest advised Prednisone at 40 mg, plan to taper by 10 mg every week down to 20 mg.  Has office  follow-up set for 5/10 with Dr Carlean Purl.  Patient should be getting health insurance through his new job soon.  Return to the ED yesterday evening.  Developed recurrent acute stabbing pain in the scrotum, this is new for him region, he tried to massage the area and in about 10 minutes it was better.  Within 45 minutes the pain in the scrotum recurred and radiated throughout the abdomen.  Been doing very well since he was discharged last Thursday evening, was eating well, passing BM daily, normal bowel movement that morning, compliant with the prednisone. 2 view abdominal film with a nonobstructive bowel gas pattern.  No free air. Another CT scan with contrast shows widespread, progressive small bowel inflammation and increased ileal dilation to 43 mm diameter.  No abscess, no free air.  Small amount of free fluid. WBCs 31.7 this morning.  Lipase 62, albumin 2.9, otherwise normal LFTs.  Renal function preserved.  Lactic acid is not elevated.  At 5.8, CRP is improved compared with the level of 66 in 05/2018.  U/A is negative. No fevers.  Blood pressures running high.  No tachycardia.  Mother had Crohn's disease requiring surgical management.  She died from an MI in her 50s.  His father had colon cancer in his mid to late 11s and is  living now in his early 1s.      Past Medical History:  Diagnosis Date   Asthma    Crohn's disease ([place])    SBO (small bowel obstruction) ([place])     History reviewed. No pertinent surgical history.  Prior to Admission medications   Medication Sig Start Date End Date Taking? Authorizing Provider  albuterol (PROVENTIL HFA;VENTOLIN HFA) 108 (90 Base) MCG/ACT inhaler Inhale 1-2 puffs into the lungs every 6 (six) hours as needed for wheezing or shortness of breath.   Yes [provider]  predniSONE (DELTASONE) 10 MG tablet Take 4 tablets (40 mg total) by mouth daily for 6 days, THEN 3 tablets (30 mg total) daily for 7 days, THEN 2 tablets (20 mg total) daily for  14 days. 12/22/18 01/18/19 Yes Martyn Malay, MD    Scheduled Meds:  enoxaparin (LOVENOX) injection  40 mg Subcutaneous Daily   predniSONE  40 mg Oral Q breakfast   Infusions:  PRN Meds: morphine injection   Allergies as of 12/25/2018   (No Known Allergies)    Family History  Problem Relation Age of Onset   Crohn's disease Mother    Heart attack Mother        In her late 54s.  This was the cause of her death.   Colon cancer Father        In his mid-to-late 1s.  As of 2020, patient in his early 65s and cancer said to be in remission.    Social History   Socioeconomic History   Marital status: Single    Spouse name: Not on file   Number of children: Not on file   Years of education: Not on file   Highest education level: Not on file  Occupational History    Employer: [employer] resource strain: Not on file   Food insecurity:    Worry: Not on file    Inability: Not on file   Transportation needs:    Medical: Not on file    Non-medical: Not on file  Tobacco Use   Smoking status: Current Every Day Smoker    Packs/day: 0.50    Types: Cigarettes   Smokeless tobacco: Never Used  Substance and Sexual Activity   Alcohol use: Yes   Drug use: Yes    Types: Marijuana   Sexual activity: Not on file  Lifestyle   Physical activity:    Days per week: Not on file    Minutes per session: Not on file   Stress: Not on file  Relationships   Social connections:    Talks on phone: Not on file    Gets together: Not on file    Attends religious service: Not on file    Active member of club or organization: Not on file    Attends meetings of clubs or organizations: Not on file    Relationship status: Not on file   Intimate partner violence:    Fear of current or ex partner: Not on file    Emotionally abused: Not on file    Physically abused: Not on file    Forced sexual activity: Not on file  Other Topics Concern   Not  on file  Social History Narrative   Engaged. Lives with fiance and father in [place].   HS grad + 1 year college   + ETOH, marijuana and smoker   Starting new job as Therapist, music     REVIEW OF  SYSTEMS: Constitutional: Has been feeling quite well up until the pain recurred last night ENT:  No nose bleeds Pulm: No shortness of breath or cough. CV:  No palpitations, no LE edema.  Chest pain GU:  No hematuria, no frequency.  No dysuria.  No blood in his urine.  Pain in scrotum as per HPI. GI: The HPI. Heme: No bleeding or bruising. Transfusions: None. Neuro:  No headaches, no peripheral tingling or numbness.  No syncope Derm:  No itching, no rash or sores.  Endocrine:  No sweats or chills.  No polyuria or dysuria Immunization: Not queried. Travel:  None beyond local counties in last few months.    PHYSICAL EXAM: Vital signs in last 24 hours: Vitals:   12/26/18 0245 12/26/18 0358  BP: (!) 153/91 (!) 145/93  Pulse: 77 76  Resp: 18 16  Temp:  97.8 F (36.6 C)  SpO2: 95% 97%   Wt Readings from Last 3 Encounters:  12/26/18 60.9 kg  05/03/18 62.4 kg    General: Pleasant, alert, comfortable.  Does not look ill. Head: No facial asymmetry or swelling.  No signs of head trauma. Eyes: No scleral icterus.  No conjunctival pallor.  EOMI. Ears: Hard of hearing Nose: No congestion or discharge. Mouth: Moist, clear, pink oral mucosa.  Tongue midline. Neck: No JVD, no thyromegaly, no bruits. Lungs: Clear bilaterally.  Good breath sounds.  No dyspnea. Heart: RRR.  No MRG.  S1, S2 present. Abdomen: Soft.  Mild to moderate tenderness in the lower abdomen without guarding or rebound.  No distention.  Bowel sounds active..   Rectal: Deferred Musc/Skeltl: No joint redness, swelling or gross deformity. Extremities: No CCE. Neurologic: Fully alert and oriented.  Moves all 4 limbs.  No limb weakness, tremors, or gross deficits. Skin: No rash, no sores. Nodes: No cervical or  inguinal adenopathy. Psych: Pleasant, cooperative, calm.  Intake/Output from previous day: 04/26 0701 - 04/27 0700 In: 1000 [IV Piggyback:1000] Out: -  Intake/Output this shift: Total I/O In: -  Out: 100 [Urine:100]  LAB RESULTS: Recent Labs    12/25/18 2239 12/26/18 0407  WBC 15.7* 31.7*  HGB 13.2 12.5*  HCT 40.9 38.5*  PLT 418* 421*   BMET Lab Results  Component Value Date   NA 137 12/26/2018   NA 135 12/25/2018   NA 138 12/22/2018   K 3.7 12/26/2018   K 3.9 12/25/2018   K 3.8 12/22/2018   CL 101 12/26/2018   CL 101 12/25/2018   CL 106 12/22/2018   CO2 27 12/26/2018   CO2 26 12/25/2018   CO2 22 12/22/2018   GLUCOSE 124 (H) 12/26/2018   GLUCOSE 105 (H) 12/25/2018   GLUCOSE 101 (H) 12/22/2018   BUN 9 12/26/2018   BUN 10 12/25/2018   BUN 6 12/22/2018   CREATININE 0.74 12/26/2018   CREATININE 0.94 12/25/2018   CREATININE 0.84 12/22/2018   CALCIUM 8.9 12/26/2018   CALCIUM 8.9 12/25/2018   CALCIUM 9.0 12/22/2018   LFT Recent Labs    12/25/18 2239  PROT 6.7  ALBUMIN 2.9*  AST 16  ALT 17  ALKPHOS 66  BILITOT 0.5   PT/INR No results found for: INR, PROTIME Hepatitis Panel No results for input(s): HEPBSAG, HCVAB, HEPAIGM, HEPBIGM in the last 72 hours. C-Diff No components found for: CDIFF Lipase     Component Value Date/Time   LIPASE 62 (H) 12/25/2018 2239    Drugs of Abuse  No results found for: LABOPIA, [place], LABBENZ, AMPHETMU, THCU, LABBARB  RADIOLOGY STUDIES: Ct Abdomen Pelvis W Contrast  Result Date: 12/26/2018 CLINICAL DATA:  43 year old male with Persistent abdominal pain, abnormal terminal ileum on recent CT Abdomen and Pelvis. Crohn disease. EXAM: CT ABDOMEN AND PELVIS WITH CONTRAST TECHNIQUE: Multidetector CT imaging of the abdomen and pelvis was performed using the standard protocol following bolus administration of intravenous contrast. CONTRAST:  174m OMNIPAQUE IOHEXOL 300 MG/ML  SOLN COMPARISON:  CT Abdomen and Pelvis  12/20/2018 FINDINGS: Lower chest: Mild lung base atelectasis. No pericardial or pleural effusion. Hepatobiliary: Stable liver.  Negative gallbladder. Pancreas: Negative. Spleen: Negative. Adrenals/Urinary Tract: Normal adrenal glands. Bilateral renal enhancement and contrast excretion is symmetric and within normal limits. Diminutive and unremarkable urinary bladder. Stomach/Bowel: Rectosigmoid colon is decompressed today. There is confluent mesenteric inflammation in the pelvis although this appears more related to the distal small bowel. Similarly decompressed left colon. The transverse colon and right colon have a more normal appearance with retained stool. From the ileocecal valve there is generalized thickening and indistinctness of the terminal ileum with upstream dilated and inflamed distal small bowel with confluent mesenteric stranding. See series 3 images 67-77. The upstream small bowel is dilated up to 43 millimeters diameter and progressed from the recent CT. There is an abrupt transition from the most dilated loop into decompressed upstream small bowel. However, there is intermittent fluid-filled, thickened and hyperenhancing small bowel in the left abdomen. Similar distension of the proximal stomach with negative duodenum. No free air. Small volume mesenteric free fluid suspected and increased. Vascular/Lymphatic: Major arterial structures are patent and normal. Portal venous system is patent. No lymphadenopathy. Reproductive: Negative. Other: Trace if any pelvic free fluid. Musculoskeletal: No acute osseous abnormality identified. IMPRESSION: Widespread inflamed small bowel compatible with exacerbation of Crohn disease which has progressed since the CT on 12/20/2018. Confluent distal small bowel inflammation to the TI with increasingly dilated ileum up to 43 mm diameter. Small volume of free fluid.  No abscess or free air identified. Electronically Signed   By: HGenevie AnnM.D.   On: 12/26/2018 01:31    Dg Abd 2 Views  Result Date: 12/26/2018 CLINICAL DATA:  43year old male with abdominal pain. EXAM: ABDOMEN - 2 VIEW COMPARISON:  CT Abdomen and Pelvis 12/20/2018 and earlier. FINDINGS: Upright and supine views of the abdomen and pelvis. No pneumoperitoneum. Negative visible lung bases. Non obstructed bowel gas pattern with increased gas at the splenic flexure from the recent CT. Pelvic phleboliths redemonstrated. Stable abdominal visceral contours. No acute osseous abnormality identified. IMPRESSION: Non obstructed bowel gas pattern.  No free air. Electronically Signed   By: HGenevie AnnM.D.   On: 12/26/2018 00:16     IMPRESSION:   *    Crohn's disease of the small bowel.  So far management has consisted of steroid therapy.  Symptoms began in the fall 2019.  Despite compliance with prednisone starting a week ago, his symptoms have returned and CT shows progression of small bowel inflammation. So far he has received 125 mg of Solu-Medrol.  *    Mild elevation Lipase, no evidence pancreatitis on the CT today.     PLAN:     *  ? Pursue colonoscopy with goal to visualize ileum, obtain biopsies to confirm Dx of Crohns?  There is no nausea vomiting, he seems capable of tolerating bowel prep. Dr NSilverio Decampwill see pt and is on this week.   Clear liquids ok.     SAzucena Freed 12/26/2018, 9:22 AM Phone [phone]  Attending physician's note   I have taken a history, examined the patient and reviewed the chart. I agree with the Advanced Practitioner's note, impression and recommendations.  Recurrent hospitalization with abdominal pain , nausea and obstipation X 24 hours. Repeat CT abd & pelvis showed thickened TI and IC valve, dilated proximal small bowel concerning for partial bowel obstruction.  He started passing air this afternoon, no BM yet.  Presentation is concerning for crohn's flare. Worsening leucocytosis No evidence of intra abdominal abscess or fistula Will switch to IV  solumedrol 55m daily Start IV cipro and flagyl Clear liquids as tolerated Will reassess tomorrow am and plan to start bowel prep for colonoscopy tentatively on Wednesday      KDamaris Hippo, MD [phone]

## 2018-12-26 NOTE — ED Notes (Signed)
Back from xray

## 2018-12-26 NOTE — Discharge Summary (Addendum)
[facility] Discharge Summary  Patient name: Eric Castillo. Medical record number: [id number] Date of birth: 03/12/1976 Age: 43 y.o. Gender: male Date of Admission: 12/25/2018  Date of Discharge: 12/29/18 Admitting Physician: Blane Ohara McDiarmid, MD  Primary Care Provider: Wilber Oliphant, MD Consultants: GI  Indication for Hospitalization: Abdominal pain due to Crohn's flare  Discharge Diagnoses/Problem List:  Crohn's disease Tobacco use Strong family history of CAD  Disposition: home  Discharge Condition: stable, improved  Discharge Exam:  General: Thin African-American male, well nourished, well developed, in no acute distress with non-toxic appearance, sitting up comfortably on side of bed HEENT: normocephalic, atraumatic, moist mucous membranes Neck: supple, normal ROM CV: regular rate and rhythm without murmurs, rubs, or gallops, no lower extremity edema, 2+ radial and pedal pulses bilaterally Lungs: clear to auscultation bilaterally with normal work of breathing room air Abdomen: soft, non-tender, non-distended, normoactive bowel sounds Skin: warm, dry, no rashes or lesions Extremities: warm and well perfused, normal tone Neuro: Alert and oriented, speech normal  Brief Hospital Course:  Patient was admitted on 4/27 for abdominal pain likely due to a Crohn's disease flare.  GI was consulted and IV Solu-Medrol, IV Flagyl, and IV ciprofloxacin were started.  Patient abdominal pain improved over the following 24 hours.  A colonoscopy was scheduled for 4/29, which showed evidence of Crohn's throughout his colon and ileum.  His diet was advanced which was tolerated well. He received his first Remicade infusion on 4/30 and was transitioned from IV Solu-Medrol to p.o. prednisone on 5/1. He was discharged with recommendations to continue PO antibiotics for a total of 7 days and a PO prednisone taper.  He was instructed to receive his Remicade infusions at week  2, 6, then every 8 weeks which are supposed to be arranged by [facility].  A cardiac work-up was initiated after patient reported that he has a strong family history of early death due to cardiac events.  An EKG was performed, which did not show LVH or other signs of ischemia.  A lipid panel was performed, which was normal.  ASCVD risk was 4.5%.  No further work-up was performed due to these reassuring results.  Patient also complained of perineal pain on admission and during his hospitalization.  A urine culture showed no growth, and urine GC/chlamydia was negative.  His pain was attributed to referred pain from his abdomen since it waxed and waned along with his abdominal pain.  This had resolved by day of discharge.  Return precautions were discussed at length and close follow-up arranged.  Issues for Follow Up:  1. Ensure patient follows up with GI for further treatment of his Crohn's disease.   2. He has an extensive family history of early CAD - mom passed of MI at 47, dad with multiple stents in his 7s, brother with sudden death at 63. ASCVD risk is 4.5%.  EKG did not show signs of LVH or ischemia.  Needs outpatient cards eval and referral.  Consider coronary artery calcium scoring with cardiology as outpatient.  Significant Procedures: Colonoscopy  Significant Labs and Imaging:  Recent Labs  Lab 12/27/18 0316 12/28/18 0310 12/29/18 0322  WBC 24.7* 23.6* 17.6*  HGB 12.2* 14.1 13.6  HCT 36.2* 42.3 41.5  PLT 432* 419* 406*   Recent Labs  Lab 12/25/18 2239 12/26/18 0748 12/27/18 0316 12/28/18 0310  NA 135 137 136 140  K 3.9 3.7 3.6 3.9  CL 101 101 99 101  CO2 26 27  28 24  GLUCOSE 105* 124* 98 94  BUN 10 9 9 13   CREATININE 0.94 0.74 0.83 1.00  CALCIUM 8.9 8.9 9.3 9.8  ALKPHOS 66  --   --   --   AST 16  --   --   --   ALT 17  --   --   --   ALBUMIN 2.9*  --   --   --    HBsAg: negative  Ct Abdomen Pelvis W Contrast  Result Date: 12/26/2018 CLINICAL DATA:  43 year old male  with Persistent abdominal pain, abnormal terminal ileum on recent CT Abdomen and Pelvis. Crohn disease. EXAM: CT ABDOMEN AND PELVIS WITH CONTRAST TECHNIQUE: Multidetector CT imaging of the abdomen and pelvis was performed using the standard protocol following bolus administration of intravenous contrast. CONTRAST:  162m OMNIPAQUE IOHEXOL 300 MG/ML  SOLN COMPARISON:  CT Abdomen and Pelvis 12/20/2018 FINDINGS: Lower chest: Mild lung base atelectasis. No pericardial or pleural effusion. Hepatobiliary: Stable liver.  Negative gallbladder. Pancreas: Negative. Spleen: Negative. Adrenals/Urinary Tract: Normal adrenal glands. Bilateral renal enhancement and contrast excretion is symmetric and within normal limits. Diminutive and unremarkable urinary bladder. Stomach/Bowel: Rectosigmoid colon is decompressed today. There is confluent mesenteric inflammation in the pelvis although this appears more related to the distal small bowel. Similarly decompressed left colon. The transverse colon and right colon have a more normal appearance with retained stool. From the ileocecal valve there is generalized thickening and indistinctness of the terminal ileum with upstream dilated and inflamed distal small bowel with confluent mesenteric stranding. See series 3 images 67-77. The upstream small bowel is dilated up to 43 millimeters diameter and progressed from the recent CT. There is an abrupt transition from the most dilated loop into decompressed upstream small bowel. However, there is intermittent fluid-filled, thickened and hyperenhancing small bowel in the left abdomen. Similar distension of the proximal stomach with negative duodenum. No free air. Small volume mesenteric free fluid suspected and increased. Vascular/Lymphatic: Major arterial structures are patent and normal. Portal venous system is patent. No lymphadenopathy. Reproductive: Negative. Other: Trace if any pelvic free fluid. Musculoskeletal: No acute osseous  abnormality identified. IMPRESSION: Widespread inflamed small bowel compatible with exacerbation of Crohn disease which has progressed since the CT on 12/20/2018. Confluent distal small bowel inflammation to the TI with increasingly dilated ileum up to 43 mm diameter. Small volume of free fluid.  No abscess or free air identified. Electronically Signed   By: HGenevie AnnM.D.   On: 12/26/2018 01:31   Ct Abdomen Pelvis W Contrast  Result Date: 12/20/2018 CLINICAL DATA:  Abdominal distention EXAM: CT ABDOMEN AND PELVIS WITH CONTRAST TECHNIQUE: Multidetector CT imaging of the abdomen and pelvis was performed using the standard protocol following bolus administration of intravenous contrast. CONTRAST:  1052mOMNIPAQUE IOHEXOL 300 MG/ML  SOLN COMPARISON:  05/03/2018 FINDINGS: Lower chest: Dependent atelectasis.  No acute abnormality. Hepatobiliary: 2.1 cm cyst in the right hepatic lobe. No suspicious attic abnormality. Gallbladder is contracted, grossly unremarkable. Pancreas: No focal abnormality or ductal dilatation. Spleen: No focal abnormality.  Normal size. Adrenals/Urinary Tract: No adrenal abnormality. No focal renal abnormality. No stones or hydronephrosis. Urinary bladder is unremarkable. Stomach/Bowel: There is abnormal wall thickening and surrounding inflammation involving the terminal ileum concerning for infectious or inflammatory enteritis. Crohn's disease not excluded. Small bowel proximal to this is mildly dilated suggesting a component of obstruction. Appendix immediately adjacent to the terminal ileum appears normal. Stomach and large bowel grossly unremarkable. Vascular/Lymphatic: No evidence of aneurysm or  adenopathy. Reproductive: No visible focal abnormality. Other: No free fluid or free air. Musculoskeletal: No acute bony abnormality. IMPRESSION: Abnormal wall thickening and surrounding inflammation involving the terminal ileum concerning for infectious or inflammatory enteritis. Inflammatory bowel  disease not excluded. This appears to cause some degree of bowel obstruction with mildly dilated fluid and stool filled small bowel loops proximal to the terminal ileum. Electronically Signed   By: Rolm Baptise M.D.   On: 12/20/2018 22:56   Dg Abd 2 Views  Result Date: 12/26/2018 CLINICAL DATA:  43 year old male with abdominal pain. EXAM: ABDOMEN - 2 VIEW COMPARISON:  CT Abdomen and Pelvis 12/20/2018 and earlier. FINDINGS: Upright and supine views of the abdomen and pelvis. No pneumoperitoneum. Negative visible lung bases. Non obstructed bowel gas pattern with increased gas at the splenic flexure from the recent CT. Pelvic phleboliths redemonstrated. Stable abdominal visceral contours. No acute osseous abnormality identified. IMPRESSION: Non obstructed bowel gas pattern.  No free air. Electronically Signed   By: Genevie Ann M.D.   On: 12/26/2018 00:16     Results/Tests Pending at Time of Discharge: None  Discharge Medications:  Allergies as of 12/30/2018   No Known Allergies     Medication List    TAKE these medications   acetaminophen 325 MG tablet Commonly known as:  TYLENOL Take 2 tablets (650 mg total) by mouth every 6 (six) hours as needed for up to 15 days for mild pain or moderate pain.   albuterol 108 (90 Base) MCG/ACT inhaler Commonly known as:  VENTOLIN HFA Inhale 1-2 puffs into the lungs every 6 (six) hours as needed for wheezing or shortness of breath.   ciprofloxacin 500 MG tablet Commonly known as:  CIPRO Take 1 tablet (500 mg total) by mouth 2 (two) times daily for 2 days.   metroNIDAZOLE 500 MG tablet Commonly known as:  FLAGYL Take 1 tablet (500 mg total) by mouth every 12 (twelve) hours for 2 days.   predniSONE 5 MG tablet Commonly known as:  DELTASONE Take 8 tablets (40 mg total) by mouth daily before breakfast for 6 days, THEN 7 tablets (35 mg total) daily before breakfast for 7 days, THEN 6 tablets (30 mg total) daily before breakfast for 7 days, THEN 5 tablets (25  mg total) daily before breakfast for 7 days, THEN 4 tablets (20 mg total) daily before breakfast for 30 days. Start taking on:  Dec 31, 2018 What changed:    medication strength  See the new instructions.   traMADol 50 MG tablet Commonly known as:  Ultram Take 1 tablet (50 mg total) by mouth every 6 (six) hours as needed for up to 3 days for moderate pain or severe pain.       Discharge Instructions: Please refer to Patient Instructions section of EMR for full details.  Patient was counseled important signs and symptoms that should prompt return to medical care, changes in medications, dietary instructions, activity restrictions, and follow up appointments.   Follow-Up Appointments: Follow-up [facility]. Go on 01/17/2019.   Specialty:  Gastroenterology Why:  at 3:45 pm for Crohn's Follow-up Contact information: [place] [address] [phone]       Wilber Oliphant, MD. Go on 01/13/2019.   Specialty:  Family Medicine Why:  at 2:10pm for hosptial follow up Contact information: [address] [place] [address] [place], [place], DO 12/30/2018, 3:06  PM PGY-1, [place]

## 2018-12-26 NOTE — Progress Notes (Signed)
Pt admitted from ED per wheel chair accompanied by nurse tech, on arrival to the floor was alert & oriented x4 skin look dry at the lower legs with no issues, c/o of pain that start from the groins and radiate to the abdomen, independent with no limitations of movement nor weakness but has been encourage to call for help if needed and the need to prevent fall, per pt pain has been well managed by the med given at ED, plan of care has been explained to pt,  will continue to monitor pt

## 2018-12-26 NOTE — ED Notes (Signed)
ED TO INPATIENT HANDOFF REPORT  ED Nurse Name and Phone #:  Chong Sicilian, RN [phone]  S Name/Age/Gender Eric Castillo. 43 y.o. male Room/Bed: 021C/021C  Code Status   Code Status: Prior  Home/SNF/Other Home Patient oriented to: self, place, time and situation Is this baseline? Yes   Triage Complete: Triage complete  Chief Complaint abd pain  Triage Note Per EMS, Sudden onset of Generalized abdominal pain. Pt was just discharged from the hospital for same.  Also complains of nausea.  170/120, pulse 60.  98% RA.  Temp 99.5   Allergies No Known Allergies  Level of Care/Admitting Diagnosis ED Disposition    ED Disposition Condition Comment   Admit  Hospital Area: [facility] [[id number]]  Level of Care: Med-Surg [16]  Covid Evaluation: N/A  Diagnosis: Crohn's disease [facility]) [[id number]]  Admitting Physician: Wilber Oliphant [[id number]]  Attending Physician: MCDIARMID, TODD D [[id number]]  PT Class (Do Not Modify): Observation [104]  PT Acc Code (Do Not Modify): Observation [10022]       B Medical/Surgery History Past Medical History:  Diagnosis Date  . Asthma   . Crohn's disease ([place])   . SBO (small bowel obstruction) ([place])    History reviewed. No pertinent surgical history.   A IV Location/Drains/Wounds Patient Lines/Drains/Airways Status   Active Line/Drains/Airways    Name:   Placement date:   Placement time:   Site:   Days:   Peripheral IV 12/25/18 Left Forearm   12/25/18    2331    Forearm   1          Intake/Output Last 24 hours  Intake/Output Summary (Last 24 hours) at 12/26/2018 0257 Last data filed at 12/26/2018 6962 Gross per 24 hour  Intake 1000 ml  Output -  Net 1000 ml    Labs/Imaging Results for orders placed or performed during the hospital encounter of 12/25/18 (from the past 48 hour(s))  Lipase, blood     Status: Abnormal   Collection Time: 12/25/18 10:39 PM  Result Value Ref Range   Lipase 62 (H) 11 - 51 U/L    Comment: Performed  at [facility], [place] [address] [place], [place] [address]  Comprehensive metabolic panel     Status: Abnormal   Collection Time: 12/25/18 10:39 PM  Result Value Ref Range   Sodium 135 135 - 145 mmol/L   Potassium 3.9 3.5 - 5.1 mmol/L   Chloride 101 98 - 111 mmol/L   CO2 26 22 - 32 mmol/L   Glucose, Bld 105 (H) 70 - 99 mg/dL   BUN 10 6 - 20 mg/dL   Creatinine, Ser 0.94 0.61 - 1.24 mg/dL   Calcium 8.9 8.9 - 10.3 mg/dL   Total Protein 6.7 6.5 - 8.1 g/dL   Albumin 2.9 (L) 3.5 - 5.0 g/dL   AST 16 15 - 41 U/L   ALT 17 0 - 44 U/L   Alkaline Phosphatase 66 38 - 126 U/L   Total Bilirubin 0.5 0.3 - 1.2 mg/dL   GFR calc non Af Amer >60 >60 mL/min   GFR calc Af Amer >60 >60 mL/min   Anion gap 8 5 - 15    Comment: Performed at [facility] [address] [place] [address]  CBC     Status: Abnormal   Collection Time: 12/25/18 10:39 PM  Result Value Ref Range   WBC 15.7 (H) 4.0 - 10.5 K/uL   RBC 4.76 4.22 - 5.81 MIL/uL  Hemoglobin 13.2 13.0 - 17.0 g/dL   HCT 40.9 39.0 - 52.0 %   MCV 85.9 80.0 - 100.0 fL   MCH 27.7 26.0 - 34.0 pg   MCHC 32.3 30.0 - 36.0 g/dL   RDW 15.2 11.5 - 15.5 %   Platelets 418 (H) 150 - 400 K/uL   nRBC 0.0 0.0 - 0.2 %    Comment: Performed at [facility] [address] [place], [place] [address]  C-reactive protein     Status: Abnormal   Collection Time: 12/25/18 11:18 PM  Result Value Ref Range   CRP 5.8 (H) <1.0 mg/dL    Comment: Performed at [facility], [place] [address], [place], [place] [address]  Lactic acid, plasma     Status: None   Collection Time: 12/26/18 12:10 AM  Result Value Ref Range   Lactic Acid, Venous 1.5 0.5 - 1.9 mmol/L    Comment: Performed at [facility] [address] [place], [place] [address]  Urinalysis, Routine w reflex microscopic     Status: None   Collection Time: 12/26/18 12:32 AM  Result Value Ref Range   Color, Urine YELLOW YELLOW   APPearance CLEAR CLEAR   Specific Gravity,  Urine 1.019 1.005 - 1.030   pH 5.0 5.0 - 8.0   Glucose, UA NEGATIVE NEGATIVE mg/dL   Hgb urine dipstick NEGATIVE NEGATIVE   Bilirubin Urine NEGATIVE NEGATIVE   Ketones, ur NEGATIVE NEGATIVE mg/dL   Protein, ur NEGATIVE NEGATIVE mg/dL   Nitrite NEGATIVE NEGATIVE   Leukocytes,Ua NEGATIVE NEGATIVE    Comment: Performed at [facility] [address], [place], [place] [address]   Ct Abdomen Pelvis W Contrast  Result Date: 12/26/2018 CLINICAL DATA:  43 year old male with Persistent abdominal pain, abnormal terminal ileum on recent CT Abdomen and Pelvis. Crohn disease. EXAM: CT ABDOMEN AND PELVIS WITH CONTRAST TECHNIQUE: Multidetector CT imaging of the abdomen and pelvis was performed using the standard protocol following bolus administration of intravenous contrast. CONTRAST:  11m OMNIPAQUE IOHEXOL 300 MG/ML  SOLN COMPARISON:  CT Abdomen and Pelvis 12/20/2018 FINDINGS: Lower chest: Mild lung base atelectasis. No pericardial or pleural effusion. Hepatobiliary: Stable liver.  Negative gallbladder. Pancreas: Negative. Spleen: Negative. Adrenals/Urinary Tract: Normal adrenal glands. Bilateral renal enhancement and contrast excretion is symmetric and within normal limits. Diminutive and unremarkable urinary bladder. Stomach/Bowel: Rectosigmoid colon is decompressed today. There is confluent mesenteric inflammation in the pelvis although this appears more related to the distal small bowel. Similarly decompressed left colon. The transverse colon and right colon have a more normal appearance with retained stool. From the ileocecal valve there is generalized thickening and indistinctness of the terminal ileum with upstream dilated and inflamed distal small bowel with confluent mesenteric stranding. See series 3 images 67-77. The upstream small bowel is dilated up to 43 millimeters diameter and progressed from the recent CT. There is an abrupt transition from the most dilated loop into decompressed upstream  small bowel. However, there is intermittent fluid-filled, thickened and hyperenhancing small bowel in the left abdomen. Similar distension of the proximal stomach with negative duodenum. No free air. Small volume mesenteric free fluid suspected and increased. Vascular/Lymphatic: Major arterial structures are patent and normal. Portal venous system is patent. No lymphadenopathy. Reproductive: Negative. Other: Trace if any pelvic free fluid. Musculoskeletal: No acute osseous abnormality identified. IMPRESSION: Widespread inflamed small bowel compatible with exacerbation of Crohn disease which has progressed since the CT on 12/20/2018. Confluent distal small bowel inflammation  to the TI with increasingly dilated ileum up to 43 mm diameter. Small volume of free fluid.  No abscess or free air identified. Electronically Signed   By: Genevie Ann M.D.   On: 12/26/2018 01:31   Dg Abd 2 Views  Result Date: 12/26/2018 CLINICAL DATA:  43 year old male with abdominal pain. EXAM: ABDOMEN - 2 VIEW COMPARISON:  CT Abdomen and Pelvis 12/20/2018 and earlier. FINDINGS: Upright and supine views of the abdomen and pelvis. No pneumoperitoneum. Negative visible lung bases. Non obstructed bowel gas pattern with increased gas at the splenic flexure from the recent CT. Pelvic phleboliths redemonstrated. Stable abdominal visceral contours. No acute osseous abnormality identified. IMPRESSION: Non obstructed bowel gas pattern.  No free air. Electronically Signed   By: Genevie Ann M.D.   On: 12/26/2018 00:16    Pending Labs Unresulted Labs (From admission, onward)   None      Vitals/Pain Today's Vitals   12/26/18 0031 12/26/18 0220 12/26/18 0230 12/26/18 0232  BP:   (!) 150/92   Pulse:   74   Resp:   15   Temp:      TempSrc:      SpO2:   94%   PainSc: 0-No pain 10-Worst pain ever  0-No pain    Isolation Precautions No active isolations  Medications Medications  sodium chloride flush (NS) 0.9 % injection 3 mL (3 mLs  Intravenous Given 12/25/18 2334)  sodium chloride 0.9 % bolus 1,000 mL (0 mLs Intravenous Stopped 12/26/18 0026)  metoCLOPramide (REGLAN) injection 10 mg (10 mg Intravenous Given 12/25/18 2334)  methylPREDNISolone sodium succinate (SOLU-MEDROL) 125 mg/2 mL injection 125 mg (125 mg Intravenous Given 12/25/18 2334)  fentaNYL (SUBLIMAZE) injection 50 mcg (50 mcg Intravenous Given 12/25/18 2334)  HYDROmorphone (DILAUDID) injection 1 mg (1 mg Intravenous Given 12/26/18 0027)  iohexol (OMNIPAQUE) 300 MG/ML solution 100 mL (100 mLs Intravenous Contrast Given 12/26/18 0041)  HYDROmorphone (DILAUDID) injection 1 mg (1 mg Intravenous Given 12/26/18 0230)    Mobility walks Low fall risk   Focused Assessments    R Recommendations: See Admitting Provider Note  Report given to:   Additional Notes:

## 2018-12-26 NOTE — ED Provider Notes (Signed)
Patient seen/examined in the Emergency Department in conjunction with Advanced Practice Provider [facility] Patient reports abdominal pain and pain in his groin Exam : awake/alert, uncomfortable appearing Diffuse abdominal tenderness With nurse chaperone present, he has tenderness to perineum but no induration/crepitus. No scrotal tenderness/edema Plan: will need CT imaging     Ripley Fraise, MD 12/26/18 [phone]

## 2018-12-27 ENCOUNTER — Other Ambulatory Visit: Payer: Self-pay

## 2018-12-27 ENCOUNTER — Encounter (HOSPITAL_COMMUNITY): Payer: Self-pay | Admitting: General Practice

## 2018-12-27 DIAGNOSIS — R110 Nausea: Secondary | ICD-10-CM

## 2018-12-27 DIAGNOSIS — K50012 Crohn's disease of small intestine with intestinal obstruction: Secondary | ICD-10-CM

## 2018-12-27 LAB — CBC
HCT: 36.2 % — ABNORMAL LOW (ref 39.0–52.0)
Hemoglobin: 12.2 g/dL — ABNORMAL LOW (ref 13.0–17.0)
MCH: 28.2 pg (ref 26.0–34.0)
MCHC: 33.7 g/dL (ref 30.0–36.0)
MCV: 83.8 fL (ref 80.0–100.0)
Platelets: 432 10*3/uL — ABNORMAL HIGH (ref 150–400)
RBC: 4.32 MIL/uL (ref 4.22–5.81)
RDW: 15.3 % (ref 11.5–15.5)
WBC: 24.7 10*3/uL — ABNORMAL HIGH (ref 4.0–10.5)
nRBC: 0 % (ref 0.0–0.2)

## 2018-12-27 LAB — LIPID PANEL
Cholesterol: 167 mg/dL (ref 0–200)
HDL: 61 mg/dL (ref >40)
LDL Cholesterol: 91 mg/dL (ref 0–99)
Total CHOL/HDL Ratio: 2.7 RATIO
Triglycerides: 75 mg/dL (ref <150)
VLDL: 15 mg/dL (ref 0–40)

## 2018-12-27 LAB — BASIC METABOLIC PANEL
Anion gap: 9 (ref 5–15)
BUN: 9 mg/dL (ref 6–20)
CO2: 28 mmol/L (ref 22–32)
Calcium: 9.3 mg/dL (ref 8.9–10.3)
Chloride: 99 mmol/L (ref 98–111)
Creatinine, Ser: 0.83 mg/dL (ref 0.61–1.24)
GFR calc Af Amer: >60 mL/min (ref >60)
GFR calc non Af Amer: >60 mL/min (ref >60)
Glucose, Bld: 98 mg/dL (ref 70–99)
Potassium: 3.6 mmol/L (ref 3.5–5.1)
Sodium: 136 mmol/L (ref 135–145)

## 2018-12-27 LAB — URINE CULTURE: Culture: NO GROWTH

## 2018-12-27 LAB — GC/CHLAMYDIA PROBE AMP (~~LOC~~) NOT AT ARMC
Chlamydia: NEGATIVE
Neisseria Gonorrhea: NEGATIVE

## 2018-12-27 MED ORDER — BISACODYL 5 MG PO TBEC
20.0000 mg | DELAYED_RELEASE_TABLET | Freq: Once | ORAL | Status: AC
  Administered 2018-12-27: 12:00:00 20 mg via ORAL
  Filled 2018-12-27: qty 4

## 2018-12-27 MED ORDER — METOCLOPRAMIDE HCL 5 MG/ML IJ SOLN
10.0000 mg | Freq: Once | INTRAMUSCULAR | Status: AC
  Administered 2018-12-28: 01:00:00 10 mg via INTRAVENOUS
  Filled 2018-12-27: qty 2

## 2018-12-27 MED ORDER — PEG-KCL-NACL-NASULF-NA ASC-C 100 G PO SOLR: 1.0000 | Freq: Once | ORAL | Status: DC

## 2018-12-27 MED ORDER — METOCLOPRAMIDE HCL 5 MG/ML IJ SOLN
10.0000 mg | Freq: Once | INTRAMUSCULAR | Status: AC
  Administered 2018-12-27: 10 mg via INTRAVENOUS
  Filled 2018-12-27: qty 2

## 2018-12-27 MED ORDER — ACETAMINOPHEN 325 MG PO TABS
650.0000 mg | ORAL_TABLET | Freq: Four times a day (QID) | ORAL | Status: DC
  Administered 2018-12-27 – 2018-12-29 (×10): 650 mg via ORAL
  Filled 2018-12-27 (×9): qty 2

## 2018-12-27 MED ORDER — PEG-KCL-NACL-NASULF-NA ASC-C 100 G PO SOLR
0.5000 | Freq: Once | ORAL | Status: AC
  Administered 2018-12-28: 100 g via ORAL
  Filled 2018-12-27: qty 1

## 2018-12-27 MED ORDER — PEG-KCL-NACL-NASULF-NA ASC-C 100 G PO SOLR
0.5000 | Freq: Once | ORAL | Status: AC
  Administered 2018-12-27: 17:00:00 100 g via ORAL
  Filled 2018-12-27: qty 1

## 2018-12-27 MED ORDER — ENOXAPARIN SODIUM 40 MG/0.4ML ~~LOC~~ SOLN
40.0000 mg | Freq: Every day | SUBCUTANEOUS | Status: DC
  Filled 2018-12-27 (×2): qty 0.4

## 2018-12-27 NOTE — H&P (View-Only) (Signed)
Daily Rounding Note  12/27/2018, 9:47 AM  LOS: 1 day   SUBJECTIVE:   Chief complaint: Crohn's disease.  Abdominal pain improved.  Pain is still located in the lower abdomen and radiates to the pelvis and scrotum loose, nonbloody stools yesterday afternoon.  No nausea.  Tolerating clears.  OBJECTIVE:         Vital signs in last 24 hours:    Temp:  [97.7 F (36.5 C)-98.3 F (36.8 C)] 97.8 F (36.6 C) (04/28 0542) Pulse Rate:  [55-77] 55 (04/28 0542) Resp:  [16] 16 (04/28 0542) BP: (120-134)/(72-84) 120/72 (04/28 0542) SpO2:  [96 %-99 %] 99 % (04/28 0542) Last BM Date: 12/25/18 Filed Weights   12/26/18 0358  Weight: 60.9 kg   General: Pleasant, comfortable.  Does not look ill. Heart: RRR. Chest: Clear bilaterally.  No cough or labored breathing. Abdomen: Soft, minimal lower abdominal tenderness. Extremities: No CCE. Neuro/Psych: Oriented x3.  Pleasant, fluid speech.  No deficits, no tremors, no weakness.  Intake/Output from previous day: 04/27 0701 - 04/28 0700 In: 1080.2 [P.O.:240; I.V.:240.2; IV Piggyback:600] Out: 1250 [Urine:1250]  Intake/Output this shift: No intake/output data recorded.  Lab Results: Recent Labs    12/25/18 2239 12/26/18 0407 12/27/18 0316  WBC 15.7* 31.7* 24.7*  HGB 13.2 12.5* 12.2*  HCT 40.9 38.5* 36.2*  PLT 418* 421* 432*   BMET Recent Labs    12/25/18 2239 12/26/18 0748 12/27/18 0316  NA 135 137 136  K 3.9 3.7 3.6  CL 101 101 99  CO2 26 27 28   GLUCOSE 105* 124* 98  BUN 10 9 9   CREATININE 0.94 0.74 0.83  CALCIUM 8.9 8.9 9.3   LFT Recent Labs    12/25/18 2239  PROT 6.7  ALBUMIN 2.9*  AST 16  ALT 17  ALKPHOS 66  BILITOT 0.5   PT/INR No results for input(s): LABPROT, INR in the last 72 hours. Hepatitis Panel No results for input(s): HEPBSAG, HCVAB, HEPAIGM, HEPBIGM in the last 72 hours.  Studies/Results: Ct Abdomen Pelvis W Contrast  Result Date:  12/26/2018 CLINICAL DATA:  43 year old male with Persistent abdominal pain, abnormal terminal ileum on recent CT Abdomen and Pelvis. Crohn disease. EXAM: CT ABDOMEN AND PELVIS WITH CONTRAST TECHNIQUE: Multidetector CT imaging of the abdomen and pelvis was performed using the standard protocol following bolus administration of intravenous contrast. CONTRAST:  170m OMNIPAQUE IOHEXOL 300 MG/ML  SOLN COMPARISON:  CT Abdomen and Pelvis 12/20/2018 FINDINGS: Lower chest: Mild lung base atelectasis. No pericardial or pleural effusion. Hepatobiliary: Stable liver.  Negative gallbladder. Pancreas: Negative. Spleen: Negative. Adrenals/Urinary Tract: Normal adrenal glands. Bilateral renal enhancement and contrast excretion is symmetric and within normal limits. Diminutive and unremarkable urinary bladder. Stomach/Bowel: Rectosigmoid colon is decompressed today. There is confluent mesenteric inflammation in the pelvis although this appears more related to the distal small bowel. Similarly decompressed left colon. The transverse colon and right colon have a more normal appearance with retained stool. From the ileocecal valve there is generalized thickening and indistinctness of the terminal ileum with upstream dilated and inflamed distal small bowel with confluent mesenteric stranding. See series 3 images 67-77. The upstream small bowel is dilated up to 43 millimeters diameter and progressed from the recent CT. There is an abrupt transition from the most dilated loop into decompressed upstream small bowel. However, there is intermittent fluid-filled, thickened and hyperenhancing small bowel in the left abdomen. Similar distension of the proximal stomach with negative duodenum. No free  air. Small volume mesenteric free fluid suspected and increased. Vascular/Lymphatic: Major arterial structures are patent and normal. Portal venous system is patent. No lymphadenopathy. Reproductive: Negative. Other: Trace if any pelvic free fluid.  Musculoskeletal: No acute osseous abnormality identified. IMPRESSION: Widespread inflamed small bowel compatible with exacerbation of Crohn disease which has progressed since the CT on 12/20/2018. Confluent distal small bowel inflammation to the TI with increasingly dilated ileum up to 43 mm diameter. Small volume of free fluid.  No abscess or free air identified. Electronically Signed   By: Genevie Ann M.D.   On: 12/26/2018 01:31   Dg Abd 2 Views  Result Date: 12/26/2018 CLINICAL DATA:  43 year old male with abdominal pain. EXAM: ABDOMEN - 2 VIEW COMPARISON:  CT Abdomen and Pelvis 12/20/2018 and earlier. FINDINGS: Upright and supine views of the abdomen and pelvis. No pneumoperitoneum. Negative visible lung bases. Non obstructed bowel gas pattern with increased gas at the splenic flexure from the recent CT. Pelvic phleboliths redemonstrated. Stable abdominal visceral contours. No acute osseous abnormality identified. IMPRESSION: Non obstructed bowel gas pattern.  No free air. Electronically Signed   By: Genevie Ann M.D.   On: 12/26/2018 00:16    ASSESMENT:   *    SB inflammation strongly s/o Crohn's Dz.  previous mgt consisted of steroids. Symptoms began fall 2019.  Despite compliance with prednisone starting a week ago, his symptoms flared and CT shows progression of small bowel inflammation. Solu-Medrol day 2(125 mg day 1, now 40 mg IV/day ).  Day 2 IV Flagyl, Cipro.  WBCs and pt clinically improved.       PLAN   *   Plan for colonoscopy tomorrow at noon.  Risks of bleeding, perforation, adverse reaction to anesthesia, technicalities of the bowel prep discussed with the patient he is agreeable to proceed.    Azucena Freed  12/27/2018, 9:47 AM Phone [phone]   Attending physician's note   I have taken an interval history, reviewed the chart and examined the patient. I agree with the Advanced Practitioner's note, impression and recommendations.   Passing flatus and had a bowel movement  yesterday.  Abdominal pain is improving.  Plan to proceed with colonoscopy with biopsies of TI and ileocecal valve tomorrow Clear liquid diet and bowel prep Continue IV Solu-Medrol, Cipro and Flagyl  The risks and benefits as well as alternatives of endoscopic procedure(s) have been discussed and reviewed. All questions answered. The patient agrees to proceed.   Damaris Hippo , MD [phone]

## 2018-12-27 NOTE — Progress Notes (Addendum)
Daily Rounding Note  12/27/2018, 9:47 AM  LOS: 1 day   SUBJECTIVE:   Chief complaint: Crohn's disease.  Abdominal pain improved.  Pain is still located in the lower abdomen and radiates to the pelvis and scrotum loose, nonbloody stools yesterday afternoon.  No nausea.  Tolerating clears.  OBJECTIVE:         Vital signs in last 24 hours:    Temp:  [97.7 F (36.5 C)-98.3 F (36.8 C)] 97.8 F (36.6 C) (04/28 0542) Pulse Rate:  [55-77] 55 (04/28 0542) Resp:  [16] 16 (04/28 0542) BP: (120-134)/(72-84) 120/72 (04/28 0542) SpO2:  [96 %-99 %] 99 % (04/28 0542) Last BM Date: 12/25/18 Filed Weights   12/26/18 0358  Weight: 60.9 kg   General: Pleasant, comfortable.  Does not look ill. Heart: RRR. Chest: Clear bilaterally.  No cough or labored breathing. Abdomen: Soft, minimal lower abdominal tenderness. Extremities: No CCE. Neuro/Psych: Oriented x3.  Pleasant, fluid speech.  No deficits, no tremors, no weakness.  Intake/Output from previous day: 04/27 0701 - 04/28 0700 In: 1080.2 [P.O.:240; I.V.:240.2; IV Piggyback:600] Out: 1250 [Urine:1250]  Intake/Output this shift: No intake/output data recorded.  Lab Results: Recent Labs    12/25/18 2239 12/26/18 0407 12/27/18 0316  WBC 15.7* 31.7* 24.7*  HGB 13.2 12.5* 12.2*  HCT 40.9 38.5* 36.2*  PLT 418* 421* 432*   BMET Recent Labs    12/25/18 2239 12/26/18 0748 12/27/18 0316  NA 135 137 136  K 3.9 3.7 3.6  CL 101 101 99  CO2 26 27 28   GLUCOSE 105* 124* 98  BUN 10 9 9   CREATININE 0.94 0.74 0.83  CALCIUM 8.9 8.9 9.3   LFT Recent Labs    12/25/18 2239  PROT 6.7  ALBUMIN 2.9*  AST 16  ALT 17  ALKPHOS 66  BILITOT 0.5   PT/INR No results for input(s): LABPROT, INR in the last 72 hours. Hepatitis Panel No results for input(s): HEPBSAG, HCVAB, HEPAIGM, HEPBIGM in the last 72 hours.  Studies/Results: Ct Abdomen Pelvis W Contrast  Result Date:  12/26/2018 CLINICAL DATA:  43 year old male with Persistent abdominal pain, abnormal terminal ileum on recent CT Abdomen and Pelvis. Crohn disease. EXAM: CT ABDOMEN AND PELVIS WITH CONTRAST TECHNIQUE: Multidetector CT imaging of the abdomen and pelvis was performed using the standard protocol following bolus administration of intravenous contrast. CONTRAST:  182m OMNIPAQUE IOHEXOL 300 MG/ML  SOLN COMPARISON:  CT Abdomen and Pelvis 12/20/2018 FINDINGS: Lower chest: Mild lung base atelectasis. No pericardial or pleural effusion. Hepatobiliary: Stable liver.  Negative gallbladder. Pancreas: Negative. Spleen: Negative. Adrenals/Urinary Tract: Normal adrenal glands. Bilateral renal enhancement and contrast excretion is symmetric and within normal limits. Diminutive and unremarkable urinary bladder. Stomach/Bowel: Rectosigmoid colon is decompressed today. There is confluent mesenteric inflammation in the pelvis although this appears more related to the distal small bowel. Similarly decompressed left colon. The transverse colon and right colon have a more normal appearance with retained stool. From the ileocecal valve there is generalized thickening and indistinctness of the terminal ileum with upstream dilated and inflamed distal small bowel with confluent mesenteric stranding. See series 3 images 67-77. The upstream small bowel is dilated up to 43 millimeters diameter and progressed from the recent CT. There is an abrupt transition from the most dilated loop into decompressed upstream small bowel. However, there is intermittent fluid-filled, thickened and hyperenhancing small bowel in the left abdomen. Similar distension of the proximal stomach with negative duodenum. No free  air. Small volume mesenteric free fluid suspected and increased. Vascular/Lymphatic: Major arterial structures are patent and normal. Portal venous system is patent. No lymphadenopathy. Reproductive: Negative. Other: Trace if any pelvic free fluid.  Musculoskeletal: No acute osseous abnormality identified. IMPRESSION: Widespread inflamed small bowel compatible with exacerbation of Crohn disease which has progressed since the CT on 12/20/2018. Confluent distal small bowel inflammation to the TI with increasingly dilated ileum up to 43 mm diameter. Small volume of free fluid.  No abscess or free air identified. Electronically Signed   By: Genevie Ann M.D.   On: 12/26/2018 01:31   Dg Abd 2 Views  Result Date: 12/26/2018 CLINICAL DATA:  43 year old male with abdominal pain. EXAM: ABDOMEN - 2 VIEW COMPARISON:  CT Abdomen and Pelvis 12/20/2018 and earlier. FINDINGS: Upright and supine views of the abdomen and pelvis. No pneumoperitoneum. Negative visible lung bases. Non obstructed bowel gas pattern with increased gas at the splenic flexure from the recent CT. Pelvic phleboliths redemonstrated. Stable abdominal visceral contours. No acute osseous abnormality identified. IMPRESSION: Non obstructed bowel gas pattern.  No free air. Electronically Signed   By: Genevie Ann M.D.   On: 12/26/2018 00:16    ASSESMENT:   *    SB inflammation strongly s/o Crohn's Dz.  previous mgt consisted of steroids. Symptoms began fall 2019.  Despite compliance with prednisone starting a week ago, his symptoms flared and CT shows progression of small bowel inflammation. Solu-Medrol day 2(125 mg day 1, now 40 mg IV/day ).  Day 2 IV Flagyl, Cipro.  WBCs and pt clinically improved.       PLAN   *   Plan for colonoscopy tomorrow at noon.  Risks of bleeding, perforation, adverse reaction to anesthesia, technicalities of the bowel prep discussed with the patient he is agreeable to proceed.    Eric Castillo  12/27/2018, 9:47 AM Phone [phone]   Attending physician's note   I have taken an interval history, reviewed the chart and examined the patient. I agree with the Advanced Practitioner's note, impression and recommendations.   Passing flatus and had a bowel movement  yesterday.  Abdominal pain is improving.  Plan to proceed with colonoscopy with biopsies of TI and ileocecal valve tomorrow Clear liquid diet and bowel prep Continue IV Solu-Medrol, Cipro and Flagyl  The risks and benefits as well as alternatives of endoscopic procedure(s) have been discussed and reviewed. All questions answered. The patient agrees to proceed.   Damaris Hippo , MD [phone]

## 2018-12-27 NOTE — Progress Notes (Addendum)
Family Medicine Teaching Service Daily Progress Note Intern Pager: [phone]  Patient name: Eric Castillo. Medical record number: [id number] Date of birth: Jan 07, 1976 Age: 43 y.o. Gender: male  Primary Care Provider: Patient, No Pcp Per Consultants: GI Code Status: FULL  Pt Overview and Major Events to Date:  4/26 admitted with recurrent abd pain, recently discharged for similar Chrons flare, GI consulted 4/27 GI started IV solumedrol, IV cipro and flagyl, plan for colonoscopy  Assessment and Plan: Eric Castillo. is a 43 y.o. male admitted for abdominal pain.  His chronic conditions include Crohn's disease, tobacco use and asthma.    Abdominal pain d/t Crohn's Flare GI consulted, patient is on solumedrol, ciprofloxacin, and flagyl.  Planning for colonoscopy on 4/29, NPO orders in.  WBC has decreased from 32 to 25 on 4/28. -Vitals per unit routine -GI recs appreciated  -clear liquid diet -PRN IV morphine 79m q4h -Avoid NSAIDS -continue IV solumedrol, cipro, and flagyl per GI recs  Pain in perineum- unclear etiology, previous exams not c/w prostatitis or testicular abnormalities. Question pelvic floor musculature dysfunction or referred pain as above. Reports sexual activity with male fiance, no others.  -PRN pain management as above -Monitor white count and temperature -Follow up GI recommendations -GC/chlamydia  -Urine culture   Family hx of early CAD/sudden death: patient reports his brother passed from sudden death in S10/03/2018 He was 42years old, previously healthy. Pt reports an autopsy was conducted, his father has the information. When asked about family hx of CAD, pt reports father has had multiple MIs with stents, as early as age 6265he thinks. Mom also passed of an MI per GI note.  Lipid panel shows a cholesterol of 167, HDL of 61, and LDL of 91, so patient does not currently need a statin.  No LVH noted on EKG.  ASCVD risk is 4.5%.  -will assign one of our clinic  providers to be a PCP so they can follow up on his outpatient cards consult, which will be placed on d/c  Hypertension, resolved BP was elevated at the beginning of his admission, likely due to pain.  Normotensive 4/28 at 120/72. -continue HCTZ 227m Tobacco use  Has smoked 1/2 PPD. Denies current use, only uses marijuana  -Nicotine patch PRN   Asthma, well controlled  No controller medications  -Albuterol PRN  FEN/GI:  -Fluids: s/p 1L bolus in ED  -Electrolytes: wnl -Nutrition: clear liquids  Access: PIV VTE prophylaxis: Lovenox q24hrs  Disposition: pending GI recs  Subjective:  Patient says that his abdominal pain has improved significantly and he is quite hungry.  He had a bowel movement yesterday and passed gas this morning.  He continues to have some pain in his groin that is relieved by urinating.  Objective: Temp:  [97.7 F (36.5 C)-98.3 F (36.8 C)] 97.8 F (36.6 C) (04/28 0542) Pulse Rate:  [55-77] 55 (04/28 0542) Resp:  [16] 16 (04/28 0542) BP: (120-134)/(72-84) 120/72 (04/28 0542) SpO2:  [96 %-99 %] 99 % (04/28 0542) Physical Exam: General: thin, pleasant male resting in bed Cardiovascular: RRR, no MRG Respiratory: CTAB, no increased WOB Abdomen: soft, nondistended, mildly tender to deep palpation Extremities: no edema, warm and well perfused, normal ROM  Laboratory: Recent Labs  Lab 12/25/18 2239 12/26/18 0407 12/27/18 0316  WBC 15.7* 31.7* 24.7*  HGB 13.2 12.5* 12.2*  HCT 40.9 38.5* 36.2*  PLT 418* 421* 432*   Recent Labs  Lab 12/20/18 2133  12/25/18 2239 12/26/18 0748 12/27/18  0316  NA 140   < > 135 137 136  K 3.3*   < > 3.9 3.7 3.6  CL 106   < > 101 101 99  CO2 26   < > 26 27 28   BUN 8   < > 10 9 9   CREATININE 0.89   < > 0.94 0.74 0.83  CALCIUM 8.7*   < > 8.9 8.9 9.3  PROT 5.9*  --  6.7  --   --   BILITOT 0.5  --  0.5  --   --   ALKPHOS 55  --  66  --   --   ALT 11  --  17  --   --   AST 12*  --  16  --   --   GLUCOSE 99   < >  105* 124* 98   < > = values in this interval not displayed.    Imaging/Diagnostic Tests: No results found.   Kathrene Alu, MD 12/27/2018, 8:48 AM PGY-2, [facility] Intern pager: [phone], text pages welcome

## 2018-12-28 ENCOUNTER — Encounter (HOSPITAL_COMMUNITY)
Admission: EM | Disposition: A | Discharge status: Home / Self Care | Payer: Self-pay | Source: Home / Self Care | Attending: Family Medicine

## 2018-12-28 ENCOUNTER — Inpatient Hospital Stay (HOSPITAL_COMMUNITY): Payer: Self-pay | Admitting: Certified Registered Nurse Anesthetist

## 2018-12-28 ENCOUNTER — Encounter (HOSPITAL_COMMUNITY): Payer: Self-pay | Admitting: Certified Registered Nurse Anesthetist

## 2018-12-28 DIAGNOSIS — K523 Indeterminate colitis: Secondary | ICD-10-CM

## 2018-12-28 DIAGNOSIS — K922 Gastrointestinal hemorrhage, unspecified: Secondary | ICD-10-CM

## 2018-12-28 HISTORY — PX: BIOPSY: SHX5522

## 2018-12-28 HISTORY — PX: COLONOSCOPY WITH PROPOFOL: SHX5780

## 2018-12-28 LAB — BASIC METABOLIC PANEL
Anion gap: 15 (ref 5–15)
BUN: 13 mg/dL (ref 6–20)
CO2: 24 mmol/L (ref 22–32)
Calcium: 9.8 mg/dL (ref 8.9–10.3)
Chloride: 101 mmol/L (ref 98–111)
Creatinine, Ser: 1 mg/dL (ref 0.61–1.24)
GFR calc Af Amer: >60 mL/min (ref >60)
GFR calc non Af Amer: >60 mL/min (ref >60)
Glucose, Bld: 94 mg/dL (ref 70–99)
Potassium: 3.9 mmol/L (ref 3.5–5.1)
Sodium: 140 mmol/L (ref 135–145)

## 2018-12-28 LAB — CBC
HCT: 42.3 % (ref 39.0–52.0)
Hemoglobin: 14.1 g/dL (ref 13.0–17.0)
MCH: 27.9 pg (ref 26.0–34.0)
MCHC: 33.3 g/dL (ref 30.0–36.0)
MCV: 83.6 fL (ref 80.0–100.0)
Platelets: 419 10*3/uL — ABNORMAL HIGH (ref 150–400)
RBC: 5.06 MIL/uL (ref 4.22–5.81)
RDW: 14.9 % (ref 11.5–15.5)
WBC: 23.6 10*3/uL — ABNORMAL HIGH (ref 4.0–10.5)
nRBC: 0 % (ref 0.0–0.2)

## 2018-12-28 LAB — GC/CHLAMYDIA PROBE AMP (~~LOC~~) NOT AT ARMC
Chlamydia: NEGATIVE
Neisseria Gonorrhea: NEGATIVE

## 2018-12-28 SURGERY — COLONOSCOPY WITH PROPOFOL
Anesthesia: Monitor Anesthesia Care

## 2018-12-28 MED ORDER — LACTATED RINGERS IV SOLN
INTRAVENOUS | Status: DC
  Administered 2018-12-28: 11:00:00 via INTRAVENOUS

## 2018-12-28 MED ORDER — PROPOFOL 500 MG/50ML IV EMUL
INTRAVENOUS | Status: DC | PRN
  Administered 2018-12-28: 150 ug/kg/min via INTRAVENOUS
  Administered 2018-12-28: 13:00:00 via INTRAVENOUS

## 2018-12-28 MED ORDER — PROPOFOL 10 MG/ML IV BOLUS
INTRAVENOUS | Status: DC | PRN
  Administered 2018-12-28: 25 mg via INTRAVENOUS
  Administered 2018-12-28: 30 mg via INTRAVENOUS
  Administered 2018-12-28 (×2): 25 mg via INTRAVENOUS

## 2018-12-28 MED ORDER — OXYCODONE HCL 5 MG PO TABS
5.0000 mg | ORAL_TABLET | ORAL | Status: DC | PRN
  Administered 2018-12-28: 5 mg via ORAL
  Filled 2018-12-28: qty 1

## 2018-12-28 SURGICAL SUPPLY — 22 items

## 2018-12-28 NOTE — Anesthesia Procedure Notes (Signed)
Procedure Name: MAC Date/Time: 12/28/2018 12:47 PM Performed by: Imagene Riches, CRNA Pre-anesthesia Checklist: Patient identified, Emergency Drugs available, Suction available and Patient being monitored Patient Re-evaluated:Patient Re-evaluated prior to induction Oxygen Delivery Method: Simple face mask

## 2018-12-28 NOTE — Progress Notes (Signed)
Family Medicine Teaching Service Daily Progress Note Intern Pager: [phone]  Patient name: Eric Castillo. Medical record number: [id number] Date of birth: [birth year] Age: 43 y.o. Gender: male  Primary Care Provider: Wilber Oliphant, MD Consultants: GI Code Status: FULL  Pt Overview and Major Events to Date:  4/26 admitted with recurrent abd pain, recently discharged for similar Chrons flare, GI consulted 4/27 GI started IV solumedrol, IV cipro and flagyl, plan for colonoscopy 4/28 improvement in pain and WBC, GI scheduled colonoscopy for 4/29  Assessment and Plan: Franklin Baumbach. is a 43 y.o. male admitted for abdominal pain.  His chronic conditions include Crohn's disease, tobacco use and asthma.    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 Patient tolerated procedure well. Remained hemodynamically stable and afebrile.  Hemoglobin stable at 13.6.  Leukocytosis continuing to downtrend (24.7>23.6>17.6). Surgical pathology pending. Given possibility of starting biologics like Remicade, screening HBsAg negative. - Vitals per unit routine - Diet advanced to soft diet this AM - GI recs appreciated, plan to switch from IV Solumedrol to PO Prednisone 10m, consider starting Remicade, GI follow up in 1-2 weeks - Follow up GI pathology - Continue Cipro and Flagyl x 7 days (4/27-5/3) - Avoid NSAIDs - PO Oxy 568mq4 hours PRN  Pain in perineum: improved Notes groin pain resolved. Overall, unclear etiology. Previous exams not c/w prostatitis or testicular abnormalities. Question pelvic floor musculature dysfunction or referred pain as above. Only one sexual partner with Fiance. GC/Chlamydia and urine culture negative. - Continue to monitor -PRN pain management as above -Follow up GI  recommendations  Family hx of early CAD/sudden death: Patient endorsed family history (brother) passed from sudden deathin Sept 2019 (at 3640o).  African family history of CAD: father with multiple MIs with stents, as early as age 89Mom passed of an MI per GI note. According to lipid panel and ASCVD of 4.5%, patient does not need a Statin.  - recommend cardiology follow up as outpatient  Hypertension, resolved BP improved normotensive this AM -continue HCTZ 254mTobacco use  Has smoked 1/2 PPD. Denies current use, only uses marijuana  -Nicotine patch PRN   Asthma, well controlled  No controller medications  -Albuterol PRN  FEN/GI:  Access: PIV DIET: soft diet VTE prophylaxis: Lovenox q24hrs  Disposition: pending GI recs  Subjective:  Patient doing very well this morning.  Denies any pain currently, received Tylenol this morning and that helped has been ambulating well without difficulty.  Is very hungry and ready to start solids.  Notes bubbles/cramping in lower abdomen when he starts to eat, but otherwise denies any abdominal pain.  Denies any further groin pain.   Objective: Temp:  [98.5 F (36.9 C)-99.9 F (37.7 C)] 98.6 F (37 C) (04/30 0516) Pulse Rate:  [81-109] 81 (04/30 0516) Resp:  [15-26] 18 (04/30 0516) BP: (111-149)/(64-91) 113/71 (04/30 0516) SpO2:  [96 %-100 %] 98 % (04/30 0516) Weight:  [61.2 kg] 61.2 kg (04/29 1107) Physical Exam: General: thin [place], well nourished, well developed, in no acute distress with non-toxic appearance, lying comfortably in bed HEENT: normocephalic, atraumatic, moist mucous membranes Neck: supple, normal ROM CV: regular rate and rhythm without murmurs, rubs, or gallops, no lower extremity edema Lungs: clear to auscultation bilaterally with normal work of breathing Abdomen: soft, non-tender, non-distended, normoactive  bowel sounds Skin: warm, dry, no rashes or lesions Extremities: warm and well  perfused Neuro: Alert and oriented, speech normal  Laboratory: Recent Labs  Lab 12/27/18 0316 12/28/18 0310 12/29/18 0322  WBC 24.7* 23.6* 17.6*  HGB 12.2* 14.1 13.6  HCT 36.2* 42.3 41.5  PLT 432* 419* 406*   Recent Labs  Lab 12/25/18 2239 12/26/18 0748 12/27/18 0316 12/28/18 0310  NA 135 137 136 140  K 3.9 3.7 3.6 3.9  CL 101 101 99 101  CO2 26 27 28 24   BUN 10 9 9 13   CREATININE 0.94 0.74 0.83 1.00  CALCIUM 8.9 8.9 9.3 9.8  PROT 6.7  --   --   --   BILITOT 0.5  --   --   --   ALKPHOS 66  --   --   --   ALT 17  --   --   --   AST 16  --   --   --   GLUCOSE 105* 124* 98 94    Imaging/Diagnostic Tests: No results found.   Mina Marble [place], DO 12/29/2018, 9:42 AM PGY-1, [place] Intern pager: [phone], text pages welcome

## 2018-12-28 NOTE — Progress Notes (Signed)
Family Medicine Teaching Service Daily Progress Note Intern Pager: [phone]  Patient name: Eric Castillo. Medical record number: [id number] Date of birth: 31-May-1976 Age: 43 y.o. Gender: male  Primary Care Provider: Wilber Oliphant, MD Consultants: GI Code Status: FULL  Pt Overview and Major Events to Date:  4/26 admitted with recurrent abd pain, recently discharged for similar Chrons flare, GI consulted 4/27 GI started IV solumedrol, IV cipro and flagyl, plan for colonoscopy 4/28 improvement in pain and WBC, GI scheduled colonoscopy for 4/29  Assessment and Plan: Eric Castillo. is a 43 y.o. male admitted for abdominal pain.  His chronic conditions include Crohn's disease, tobacco use and asthma.    Abdominal pain d/t Crohn's Flare, improving Patient's pain improved over the past day, with no usage of PRN morphine for almost 24 hours.  Patient underwent preparation for colonoscopy overnight and did require Reglan once. Will undergo colonoscopy today.  Continues to be on cipro, flagyl, and solumedrol.  WBC has decreased from 25 to 23 on 4/29. -Vitals per unit routine -GI recs appreciated, will follow up colonoscopy findings -PRN IV morphine 61m q4h, will change to oxycodone 5 mg Q4H PRN after colonoscopy -Avoid NSAIDS -continue IV solumedrol, cipro, and flagyl per GI recs  Pain in perineum- unclear etiology, previous exams not c/w prostatitis or testicular abnormalities. Question pelvic floor musculature dysfunction or referred pain as above. Reports sexual activity with male fiance, no others.  GC/Chlamydia testing is in progress.  Urine culture has shown no growth. -PRN pain management as above -Monitor white count and temperature -Follow up GI recommendations  Family hx of early CAD/sudden death: patient reports his brother passed from sudden death in S10/23/2019 He was 42years old, previously healthy. Pt reports an autopsy was conducted, his father has the information. When  asked about family hx of CAD, pt reports father has had multiple MIs with stents, as early as age 3067he thinks. Mom also passed of an MI per GI note.  Lipid panel shows a cholesterol of 167, HDL of 61, and LDL of 91, so patient does not currently need a statin.  No LVH noted on EKG.  ASCVD risk is 4.5%.  [facility] who will follow up with patient and ensure he sees cardiology  Hypertension, resolved BP was elevated at the beginning of his admission, likely due to pain.  Normotensive 4/28 at 120/72. -continue HCTZ 22m Tobacco use  Has smoked 1/2 PPD. Denies current use, only uses marijuana  -Nicotine patch PRN   Asthma, well controlled  No controller medications  -Albuterol PRN  FEN/GI:  NPO Access: PIV VTE prophylaxis: Lovenox q24hrs  Disposition: pending GI recs  Subjective:  Patient says that he is very hungry this morning but otherwise his abdominal pain remains improved.  He says that his groin pain is worse when he is hungry.  Objective: Temp:  [98 F (36.7 C)-98.5 F (36.9 C)] 98.5 F (36.9 C) (04/29 0555) Pulse Rate:  [65-79] 79 (04/29 0555) Resp:  [16-18] 16 (04/29 0555) BP: (121-129)/(80-87) 121/80 (04/29 0555) SpO2:  [99 %-100 %] 99 % (04/29 0555) Physical Exam: General: thin, pleasant male sitting on edge of bed Cardiovascular: RRR, no MRG Respiratory: CTAB, no increased WOB Abdomen: soft, nondistended, nontender to palpation Extremities: no edema, normal ROM  Laboratory: Recent Labs  Lab 12/26/18 0407 12/27/18 0316 12/28/18 0310  WBC 31.7* 24.7* 23.6*  HGB 12.5* 12.2* 14.1  HCT 38.5* 36.2* 42.3  PLT 421* 432* 419*   Recent Labs  Lab 12/25/18 2239 12/26/18 0748 12/27/18 0316 12/28/18 0310  NA 135 137 136 140  K 3.9 3.7 3.6 3.9  CL 101 101 99 101  CO2 26 27 28 24   BUN 10 9 9 13   CREATININE 0.94 0.74 0.83 1.00  CALCIUM 8.9 8.9 9.3 9.8  PROT 6.7  --   --   --   BILITOT 0.5  --   --   --   ALKPHOS 66  --    --   --   ALT 17  --   --   --   AST 16  --   --   --   GLUCOSE 105* 124* 98 94    Imaging/Diagnostic Tests: No results found.   Kathrene Alu, MD 12/28/2018, 8:08 AM PGY-2, [place] Intern pager: [phone], text pages welcome

## 2018-12-28 NOTE — Transfer of Care (Signed)
Immediate Anesthesia Transfer of Care Note  Patient: Eric Castillo.  Procedure(s) Performed: COLONOSCOPY WITH PROPOFOL (N/A ) BIOPSY  Patient Location: Endoscopy Unit  Anesthesia Type:MAC  Level of Consciousness: drowsy  Airway & Oxygen Therapy: Patient Spontanous Breathing and Patient connected to face mask oxygen  Post-op Assessment: Report given to RN, Post -op Vital signs reviewed and stable and Patient moving all extremities  Post vital signs: Reviewed and stable  Last Vitals:  Vitals Value Taken Time  BP 141/91 12/28/2018  1:16 PM  Temp    Pulse 113 12/28/2018  1:18 PM  Resp 20 12/28/2018  1:18 PM  SpO2 99 % 12/28/2018  1:18 PM  Vitals shown include unvalidated device data.  Last Pain:  Vitals:   12/28/18 1107  TempSrc: Oral  PainSc: 0-No pain      Patients Stated Pain Goal: 2 (38/10/17 5102)  Complications: No apparent anesthesia complications

## 2018-12-28 NOTE — Op Note (Signed)
[facility] Patient Name: Eric Castillo Procedure Date : 12/28/2018 MRN: [id number] Attending MD: Mauri Pole , MD Date of Birth: May 12, 1976 CSN: [id number] Age: 43 Admit Type: Inpatient Procedure:                Colonoscopy Indications:              Obtain more precise diagnosis of inflammatory bowel                            disease, Generalized abdominal pain, Abnormal CT of                            the GI tract Providers:                Mauri Pole, MD, Carlyn Reichert, RN,                            William Dalton, Technician Referring MD:              Medicines:                Monitored Anesthesia Care Complications:            No immediate complications. Estimated Blood Loss:     Estimated blood loss was minimal. Procedure:                Pre-Anesthesia Assessment:                           - Prior to the procedure, a History and Physical                            was performed, and patient medications and                            allergies were reviewed. The patient's tolerance of                            previous anesthesia was also reviewed. The risks                            and benefits of the procedure and the sedation                            options and risks were discussed with the patient.                            All questions were answered, and informed consent                            was obtained. Prior Anticoagulants: The patient has                            taken no previous anticoagulant or antiplatelet  agents. ASA Grade Assessment: III - A patient with                            severe systemic disease. After reviewing the risks                            and benefits, the patient was deemed in                            satisfactory condition to undergo the procedure.                           After obtaining informed consent, the colonoscope                            was passed under  direct vision. Throughout the                            procedure, the patient's blood pressure, pulse, and                            oxygen saturations were monitored continuously. The                            PCF-H190DL ([id number]) Olympus pediatric colonoscope                            was introduced through the anus and advanced to the                            the terminal ileum, with identification of the                            appendiceal orifice and IC valve. The colonoscopy                            was performed without difficulty. The patient                            tolerated the procedure well. The quality of the                            bowel preparation was adequate. The terminal ileum,                            ileocecal valve, appendiceal orifice, and rectum                            were photographed. Scope In: 12:45:37 PM Scope Out: 1:09:35 PM Scope Withdrawal Time: 0 hours 19 minutes 18 seconds  Total Procedure Duration: 0 hours 23 minutes 58 seconds  Findings:      The perianal and digital rectal examinations were normal.      The terminal ileum contained a benign-appearing, intrinsic severe  stenosis, could not extend beyond 5 cm.      A diffuse area of mucosa in the terminal ileum was moderately friable       with contact bleeding, ulcerated and inflammotory appearing       pseudopolyps. Mucosa in right and left colon with mild erythema and       altered vascularity. No ulcers or strictures. Biopsies were taken with a       cold forceps for histology.      The Simple Endoscopic Score for Crohn's Disease was determined based on       the endoscopic appearance of the mucosa in the following segments:      - Ileum: Findings include large ulcers 0.5-2 cm in size, greater than       30% ulcerated surfaces, greater than 75% of surfaces affected and       narrowing(s) that cannot be passed. Segment score: 11.      - Right Colon: Findings include no  ulcers present, no ulcerated       surfaces, greater than 75% of surfaces affected and no narrowings.       Segment score: 3.      - Transverse Colon: Findings include no ulcers present, no ulcerated       surfaces, greater than 75% of surfaces affected and no narrowings.       Segment score: 3.      - Left Colon: Findings include no ulcers present, no ulcerated surfaces,       greater than 75% of surfaces affected and no narrowings. Segment score:       3.      - Rectum: Findings include no ulcerated surfaces, greater than 75% of       surfaces affected and no narrowings. Segment score: 3.      - Total SES-CD aggregate score: 23. Biopsies were taken with a cold       forceps for histology. 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                           - Friability with contact bleeding in the terminal                            ileum. Biopsied.                           - Simple Endoscopic Score for Crohn's Disease: 23,                            mucosal inflammatory changes secondary to Crohn's                            disease, with ileitis and colitis. Biopsied. Recommendation:           - Patient has a contact number available for                            emergencies. The signs and symptoms of potential  delayed complications were discussed with the                            patient. Return to normal activities tomorrow.                            Written discharge instructions were provided to the                            patient.                           - Full liquid diet today and advance to soft diet                            tomorrow if tolerated.                           - Continue present medications.                           - Await pathology results.                           - Continue Cipro and flagyl for 7 days                           - Continue Solumedrol, will consider switching to                             PO prednisone 54m tomorrow if tolerating PO intake                            well                           - Will need to consider starting biologics                            (Remicade)                           - Return to GI office in 1-2 weeks. Procedure Code(s):        --- Professional ---                           [id number] Colonoscopy, flexible; with biopsy, single                            or multiple Diagnosis Code(s):        --- Professional ---                           KF81.829 Crohn's disease of both small and large                            intestine  with intestinal obstruction                           K92.2, Gastrointestinal hemorrhage, unspecified                           K52.3, Indeterminate colitis                           R10.84, Generalized abdominal pain                           R93.3, Abnormal findings on diagnostic imaging of                            other parts of digestive tract CPT copyright 2019 [employer]. All rights reserved. The codes documented in this report are preliminary and upon coder review may  be revised to meet current compliance requirements. Mauri Pole, MD 12/28/2018 1:26:38 PM This report has been signed electronically. Number of Addenda: 0

## 2018-12-28 NOTE — Interval H&P Note (Signed)
History and Physical Interval Note:  12/28/2018 12:41 PM  Eric Castillo.  has presented today for surgery, with the diagnosis of Suspected Crohn's disease of the small bowel..  The various methods of treatment have been discussed with the patient and family. After consideration of risks, benefits and other options for treatment, the patient has consented to  Procedure(s): COLONOSCOPY WITH PROPOFOL (N/A) as a surgical intervention.  The patient's history has been reviewed, patient examined, no change in status, stable for surgery.  I have reviewed the patient's chart and labs.  Questions were answered to the patient's satisfaction.     Trenell Moxey

## 2018-12-28 NOTE — Anesthesia Preprocedure Evaluation (Signed)
Anesthesia Evaluation  Patient identified by MRN, date of birth, ID band Patient awake    Reviewed: Allergy & Precautions, NPO status , Patient's Chart, lab work & pertinent test results  Airway Mallampati: II  TM Distance: >3 FB Neck ROM: Full    Dental no notable dental hx.    Pulmonary asthma , Current Smoker,    Pulmonary exam normal breath sounds clear to auscultation       Cardiovascular negative cardio ROS Normal cardiovascular exam Rhythm:Regular Rate:Normal     Neuro/Psych negative neurological ROS  negative psych ROS   GI/Hepatic negative GI ROS, Neg liver ROS,   Endo/Other  negative endocrine ROS  Renal/GU negative Renal ROS  negative genitourinary   Musculoskeletal negative musculoskeletal ROS (+)   Abdominal   Peds negative pediatric ROS (+)  Hematology negative hematology ROS (+)   Anesthesia Other Findings Crohn's Disease  Reproductive/Obstetrics negative OB ROS                             Anesthesia Physical Anesthesia Plan  ASA: III  Anesthesia Plan: MAC   Post-op Pain Management:    Induction: Intravenous  PONV Risk Score and Plan: 0 and Treatment may vary due to age or medical condition  Airway Management Planned: Simple Face Mask  Additional Equipment:   Intra-op Plan:   Post-operative Plan:   Informed Consent: I have reviewed the patients History and Physical, chart, labs and discussed the procedure including the risks, benefits and alternatives for the proposed anesthesia with the patient or authorized representative who has indicated his/her understanding and acceptance.     Dental advisory given  Plan Discussed with: CRNA  Anesthesia Plan Comments:         Anesthesia Quick Evaluation

## 2018-12-29 LAB — CBC
HCT: 41.5 % (ref 39.0–52.0)
Hemoglobin: 13.6 g/dL (ref 13.0–17.0)
MCH: 27.3 pg (ref 26.0–34.0)
MCHC: 32.8 g/dL (ref 30.0–36.0)
MCV: 83.3 fL (ref 80.0–100.0)
Platelets: 406 10*3/uL — ABNORMAL HIGH (ref 150–400)
RBC: 4.98 MIL/uL (ref 4.22–5.81)
RDW: 14.9 % (ref 11.5–15.5)
WBC: 17.6 10*3/uL — ABNORMAL HIGH (ref 4.0–10.5)
nRBC: 0 % (ref 0.0–0.2)

## 2018-12-29 LAB — HEPATITIS B SURFACE ANTIGEN: Hepatitis B Surface Ag: NEGATIVE

## 2018-12-29 MED ORDER — PREDNISONE 20 MG PO TABS: 40.0000 mg | ORAL_TABLET | Freq: Every day | ORAL | Status: DC

## 2018-12-29 MED ORDER — SODIUM CHLORIDE 0.9 % IV SOLN
5.0000 mg/kg | Freq: Once | INTRAVENOUS | Status: AC
  Administered 2018-12-29: 300 mg via INTRAVENOUS
  Filled 2018-12-29: qty 30

## 2018-12-29 MED ORDER — PREDNISONE 20 MG PO TABS
40.0000 mg | ORAL_TABLET | Freq: Every day | ORAL | Status: DC
  Administered 2018-12-30: 40 mg via ORAL
  Filled 2018-12-29: qty 2

## 2018-12-29 MED ORDER — METRONIDAZOLE 500 MG PO TABS
500.0000 mg | ORAL_TABLET | Freq: Two times a day (BID) | ORAL | Status: DC
  Administered 2018-12-29 – 2018-12-30 (×2): 500 mg via ORAL
  Filled 2018-12-29 (×2): qty 1

## 2018-12-29 MED ORDER — CIPROFLOXACIN HCL 500 MG PO TABS
500.0000 mg | ORAL_TABLET | Freq: Two times a day (BID) | ORAL | Status: DC
  Administered 2018-12-29 – 2018-12-30 (×2): 500 mg via ORAL
  Filled 2018-12-29 (×2): qty 1

## 2018-12-29 NOTE — Progress Notes (Addendum)
          Daily Rounding Note  12/29/2018, 10:17 AM  LOS: 3 days   SUBJECTIVE:   Chief complaint: crohns of SB     Feels better.  Tolerating soft diet in AM and at lunch.  Soft brown stool.  No abd pain.  Will discharge today per FP note  OBJECTIVE:         Vital signs in last 24 hours:    Temp:  [98.5 F (36.9 C)-99.9 F (37.7 C)] 98.6 F (37 C) (04/30 0516) Pulse Rate:  [81-109] 81 (04/30 0516) Resp:  [15-26] 18 (04/30 0516) BP: (111-149)/(64-91) 113/71 (04/30 0516) SpO2:  [96 %-100 %] 98 % (04/30 0516) Weight:  [61.2 kg] 61.2 kg (04/29 1107) Last BM Date: 12/27/18 Filed Weights   12/26/18 0358 12/28/18 1107  Weight: 60.9 kg 61.2 kg   General: looks well in good spirits   Heart: RRR Chest: clear bil.  No SOB Abdomen: soft, NT, ND.  Active BS Extremities: no CCE Neuro/Psych:  Oriented x 3.  Fully alert.  No weakness etc.    Intake/Output from previous day: 04/29 0701 - 04/30 0700 In: 640 [P.O.:240; I.V.:400] Out: 450 [Urine:450]  Intake/Output this shift: Total I/O In: -  Out: 175 [Urine:175]  Lab Results: Recent Labs    12/27/18 0316 12/28/18 0310 12/29/18 0322  WBC 24.7* 23.6* 17.6*  HGB 12.2* 14.1 13.6  HCT 36.2* 42.3 41.5  PLT 432* 419* 406*   BMET Recent Labs    12/27/18 0316 12/28/18 0310  NA 136 140  K 3.6 3.9  CL 99 101  CO2 28 24  GLUCOSE 98 94  BUN 9 13  CREATININE 0.83 1.00  CALCIUM 9.3 9.8   Hepatitis Panel Recent Labs    12/28/18 1654  HEPBSAG Negative     ASSESMENT:   *  Crohns enteritis.  12/28/18: Colonoscopy with ileal intubation: ileal stricture.  Friable, ulcerated TI, bled on contact, biopsied.   Fortunately not anemic.   Day 3 IV Solumedrol. Day 4 Cipro/flagyl.        PLAN   *   Ordered Remicade infusion.  Should get done this evening prior to discharge.   Will need to get set up by GI office for infusion at week 2 and 6 and then q 8. Pt understands this will  be an ongoing medication.     Tested negative on Quantiferon gold test 05/2018 and Hep B surface Ag in 05/2018, 12/27/17   *  Has GI ROV with Dr Carlean Purl set for 01/17/19.        *   Home on Prednisone 40 mg/day tomorrow as already got Solumedrol today, reduce by 5 mg/week down to 20 mg/day.   Stopping Solumedrol now.    *  Finish cipro, flagyl on 5/3     Eric Castillo  12/29/2018, 10:17 AM Phone [phone]   Attending physician's note   I have taken an interval history, reviewed the chart and examined the patient. I agree with the Advanced Practitioner's note, impression and recommendations.   Colonoscopy findings and biopsies consistent with Crohn's disease predominantly involving terminal ileum and IC valve  Continue oral Cipro and Flagyl for 7-day course Continue prednisone taper   Plan to initiate on Remicade, during this hospitalization if possible.  If not we will try to get approval for Remicade as outpatient  K. Denzil Magnuson , MD [phone]

## 2018-12-30 ENCOUNTER — Telehealth: Payer: Self-pay

## 2018-12-30 ENCOUNTER — Encounter (HOSPITAL_COMMUNITY): Payer: Self-pay | Admitting: Gastroenterology

## 2018-12-30 DIAGNOSIS — R1031 Right lower quadrant pain: Secondary | ICD-10-CM

## 2018-12-30 MED ORDER — METRONIDAZOLE 500 MG PO TABS: 500.0000 mg | ORAL_TABLET | Freq: Two times a day (BID) | ORAL | 0 refills | Status: AC

## 2018-12-30 MED ORDER — TRAMADOL HCL 50 MG PO TABS: 50.0000 mg | ORAL_TABLET | Freq: Four times a day (QID) | ORAL | 0 refills | Status: AC | PRN

## 2018-12-30 MED ORDER — ACETAMINOPHEN 325 MG PO TABS: 650.0000 mg | ORAL_TABLET | Freq: Four times a day (QID) | ORAL | 0 refills | Status: AC | PRN

## 2018-12-30 MED ORDER — PREDNISONE 5 MG PO TABS: ORAL_TABLET | ORAL | 0 refills | Status: AC

## 2018-12-30 MED ORDER — CIPROFLOXACIN HCL 500 MG PO TABS: 500.0000 mg | ORAL_TABLET | Freq: Two times a day (BID) | ORAL | 0 refills | Status: AC

## 2018-12-30 NOTE — Discharge Instructions (Signed)
You were admitted for abdominal pain and was found to have Crohn's disease.  You were started on a steroid called prednisone, IV medicine called Remicade, and antibiotics.  It will be important for you to continue to take these medicines until completion.  Will also be very important for you to follow-up with gastroenterology for further management.  We have set up an appointment with your PCP for hospital follow-up.  Please take Tylenol for pain as needed.  We have given you a few days of tramadol for any breakthrough pain.  Expect a call from GI to set up your next Remicade infusion.  Also, as we discussed you can call the [facility] to transfer your hospital follow-up visit to a telemedicine visit if you desire.  Crohn's Disease  Crohn's disease is a long-lasting (chronic) disease that affects the gastrointestinal (GI) tract. Crohn's disease often causes irritation and inflammation in the small intestine and the beginning of the large intestine, but it can affect any part of the GI tract. Crohn's disease is part of a group of illnesses that are known as inflammatory bowel disease (IBD). Crohn's disease may start slowly and get worse over time. Symptoms may come and go. They may also go away for months or even years at a time (remission). What are the causes? The exact cause of this condition is not known. It may involve a response that causes your body's disease-fighting (immune) system to attack healthy cells and tissues (autoimmune response). Bacteria, genes, and your environment may also play a role. What increases the risk? The following factors may make you more likely to develop this condition:  Having a family member who has Crohn's disease, another IBD, or an autoimmune condition.  Using products that contain nicotine or tobacco, such as cigarettes and e-cigarettes.  Being in your 64s.  Having [place] European ancestry. What are the signs or symptoms? The main symptoms  of this condition involve your GI tract. These include:  Diarrhea.  Pain or cramping in the abdomen. This is commonly felt in the lower right side of the abdomen.  Frequent watery or bloody stools.  Constipation. This may mean having: ? Fewer bowel movements in a week than normal. ? Difficulty having a bowel movement. ? Stools that are dry, hard, or larger than normal.  Rectal bleeding.  Rectal pain.  An urgent need to have a bowel movement.  The feeling that you are not finished having a bowel movement. Other symptoms may include:  Unexplained weight loss.  Fatigue.  Fever.  Nausea.  Loss of appetite.  Joint pain.  Vision changes.  Red bumps or sores on the skin.  Sores inside the mouth. How is this diagnosed? This condition may be diagnosed based on:  Your symptoms and your medical history.  A physical exam.  Tests, which may include: ? Blood tests. ? Stool sample tests. ? Imaging tests, such as X-rays and CT scans. ? Tests to examine the inside of your intestines using a long, flexible tube that has a light and a camera on the end (endoscopy or colonoscopy). ? A procedure to remove tissue samples from inside your bowel for testing (biopsy). You may need to work with a health care provider who specializes in diseases of the digestive tract (gastroenterologist). How is this treated? There is no cure for this condition, but treatment can help you manage your symptoms. Crohn's disease affects each person differently. Your treatment may include:  Resting your bowels. This involves having  a period of healing time when your bowels are not passing stools. This may be done by: ? Drinking only clear liquids. These are liquids that you can see through, such as water, black coffee, fruit juice without pulp, broth, gelatin, and ice pops. ? Getting nutrition through an IV for a period of time.  Medicines. These may be used by themselves or with other treatments  (combination therapy). These may include antibiotic medicines. You may be given medicines that help to: ? Reduce inflammation. ? Control your immune system activity. ? Fight infections. ? Relieve cramps and prevent diarrhea. ? Control your pain.  Surgery. You may need surgery if: ? Medicines and other treatments are not working anymore. ? You develop complications from severe Crohn's disease. ? A section of your intestine becomes so damaged that it needs to be removed. Follow these instructions at home: Medicines  Take over-the-counter and prescription medicines only as told by your health care provider.  If you were prescribed an antibiotic, take it as told by your health care provider. Do not stop taking the antibiotic even if you start to feel better.  Avoid taking ibuprofen or other NSAID medicines if possible, these can make Crohn's disease worse. Eating and drinking  Talk with your health care provider or a diet and nutrition specialist (registered dietitian) about what diet is best for you.  Drink enough fluid to keep your urine pale yellow.  If you are taking steroids to reduce inflammation, get plenty of calcium in your diet to help keep your bones healthy. You may also consider taking a calcium supplement with vitamin D.  Keep a food diary to identify foods that make your symptoms better or worse.  Avoid foods that cause symptoms.  Follow instructions from your health care provider about eating or drinking restrictions if you have worsening symptoms (flare-up).  Limit alcohol intake to no more than 1 drink a day for nonpregnant women and 2 drinks a day for men. One drink equals 12 oz of beer, 5 oz of wine, or 1 oz of hard liquor. General instructions  Make sure you get all the vaccines that your health care provider recommends, especially pneumonia (pneumococcal) and flu (influenza) vaccines.  Do not use any products that contain nicotine or tobacco, such as  cigarettes and e-cigarettes. If you need help quitting, ask your health care provider.  Exercise every day, or as often told by your health care provider.  Keep all follow-up visits as told by your health care provider. This is important. Contact a health care provider if:  You have diarrhea, cramps in your abdomen, and other GI problems that are present almost all the time.  Your symptoms do not improve with treatment.  You continue to lose weight.  You develop a rash or sores on your skin.  You develop eye problems.  You have a fever.  Your symptoms get worse.  You develop new symptoms. Get help right away if:  You have bloody diarrhea.  You have severe pain in your abdomen.  You cannot pass stools. Summary  Crohn's disease affects each person differently. There are multiple treatment options that can help you manage the condition.  Talk with your health care provider or diet and nutrition specialist (registered dietitian) about what diet is best for you.  Make sure you get all the vaccines that your health care provider recommends, especially pneumonia (pneumococcal) and flu (influenza) vaccines. This information is not intended to replace advice given to you by  your health care provider. Make sure you discuss any questions you have with your health care provider. Document Released: 05/27/2005 Document Revised: 04/19/2017 Document Reviewed: 04/19/2017 Elsevier Interactive Patient Education  2019 [employer].

## 2018-12-30 NOTE — Telephone Encounter (Signed)
I spoke with Mr. Eric Castillo this am.  He is going to get me a copy of his new insurance card or will try and obtain the numbers and call me back.  We discussed that he will need to be set up for the next 2 Remicade infusions, but we need to work with his insurance to get this authorized and find out if he is required to infuse at a particular facility. He verbalized understanding to contact me asap with the insurance information.

## 2018-12-30 NOTE — Anesthesia Postprocedure Evaluation (Signed)
Anesthesia Post Note  Patient: Eric Castillo.  Procedure(s) Performed: COLONOSCOPY WITH PROPOFOL (N/A ) BIOPSY     Patient location during evaluation: Endoscopy Anesthesia Type: MAC Level of consciousness: awake and alert Pain management: pain level controlled Vital Signs Assessment: post-procedure vital signs reviewed and stable Respiratory status: spontaneous breathing, nonlabored ventilation and respiratory function stable Cardiovascular status: stable and blood pressure returned to baseline Postop Assessment: no apparent nausea or vomiting Anesthetic complications: no    Last Vitals:  Vitals:   12/29/18 2247 12/30/18 0510  BP: 128/86 137/89  Pulse: 69 60  Resp: 18 18  Temp: 36.8 C 36.5 C  SpO2: 98% 100%    Last Pain:  Vitals:   12/30/18 0510  TempSrc: Oral  PainSc:    Pain Goal: Patients Stated Pain Goal: 3 (12/28/18 2115)                 Lynda Rainwater

## 2018-12-30 NOTE — TOC Transition Note (Addendum)
Transition of Care [facility]) - CM/SW Discharge Note Marvetta Gibbons RN,BSN Transitions of Care Cross Coverage 5W - RN Case Manager [phone]  Patient Details  Name: Eric Castillo. MRN: [id number] Date of Birth: 10/29/1975  Transition of Care [facility]) CM/SW Contact:  Dawayne Patricia, RN Phone Number: 12/30/2018, 11:56 AM   Clinical Narrative:    Pt for transition home today, noted pt has no insurance- CM spoke with pt at bedside- per conversation pt states he started new job 2 mo ago, insurance is suppose to start today May 1- however when he goes to the website to look up info it still shows pending with no group # or ID info. Pt will need assistance with Medications for discharge- was [facility] assisted on his last admit. Pt states he can afford the $3 copays for meds but does not have payment on him for the [place] however is agreeable to using one of the other [facility] pharmacies. Pt provided [place] letter for meds- spoke with [facility] who will work with pt regarding payment. Pt has transportation home, and has f/u appointments for May 15 with the [facility] per pt. And May 19 with GI    Final next level of care: Home/Self Care Barriers to Discharge: No Barriers Identified   Patient Goals and CMS Choice Patient states their goals for this hospitalization and ongoing recovery are:: "to go home-hoping I don't have to stay till 5" CMS Medicare.gov Compare Post Acute Care list provided to:: Patient Choice offered to / list presented to : NA  Discharge Placement  Pt to transition home with spouse.                      Discharge Plan and Services   Discharge Planning Services: [place] Program, Medication Assistance, CM Consult Post Acute Care Choice: NA          DME Arranged: N/A DME Agency: NA       HH Arranged: NA HH Agency: NA        Social Determinants of Health (SDOH) Interventions     Readmission Risk Interventions Readmission Risk Prevention Plan  12/30/2018  Post Dischage Appt Complete  Medication Screening Complete  Transportation Screening Complete

## 2018-12-30 NOTE — Telephone Encounter (Signed)
-----   Message from Patti E [place], [place] sent at 12/29/2018  4:58 PM EDT ----- Regarding: FW: week 2 and 6 of remicade.  ----- Message ----- From: Clearence Cheek Sent: 12/29/2018   4:43 PM EDT To: Patti E [place], CMA, Gatha Mayer, MD, # Subject: week 2 and 6 of remicade.                      Hi The patient came back in a few days ago with recurrent abdominal pain.  His CT showed more widespread disease in the small bowel.  Pathology shows typical changes of Crohn's in the terminal ileum, pathology confirms Crohn's disease. Infusion of Remicade planned for this evening before he goes home.  He is feeling a lot better, tolerating soft diet, passing stool and abdominal pain-free. He is going to need set up for Remicade at weeks 2, 6 and then ongoing after that. I spoke with him and he understands that this will be an ongoing medication. Also going home on prednisone 40.  Taper will be 5 mg decrease weekly down to 20 mg where he should stay until he sees Dr. Carlean Purl back on 5/19.    His insurance kicks in I think today or tomorrow he says it is [employer].  I hope that will give him trouble with the Remicade.  However I did say sometimes our office has to speak with the insurance providers regarding specific meds.  Thank you,  S  BTW he is a super nice guy as you know

## 2019-01-02 NOTE — Telephone Encounter (Signed)
Called to speak with pt regarding insurance information. He states he is at the job location and will call back with the insurance information.

## 2019-01-03 NOTE — Telephone Encounter (Signed)
Left message for pt to call back  °

## 2019-01-09 NOTE — Telephone Encounter (Signed)
Left message for patient to call back  

## 2019-01-10 NOTE — Telephone Encounter (Signed)
Left message for patient to call back  

## 2019-01-11 NOTE — Telephone Encounter (Signed)
I will await a return call from the patient

## 2019-01-11 NOTE — Telephone Encounter (Signed)
Left message for patient to call back.  I left a detailed message that it is important that we speak about Remicade infusions to keep him on the scheduled doses to avoid needing to restart the series.    Dr. Carlean Purl I am unable to reach the patient.  He has not returned calls to provide insurance information for Remicade.  He is due for week two on 01/13/19.  How long can he wait to set up that second infusion before he will need to start the series over again?  I doubt I can get him in this week even if he called me right back.

## 2019-01-11 NOTE — Telephone Encounter (Signed)
I would think could do at 3 weeks instead of 2 and still be ok but will just have to wait for him to contact us.

## 2019-01-13 ENCOUNTER — Inpatient Hospital Stay: Payer: Self-pay | Admitting: Family Medicine

## 2019-01-16 ENCOUNTER — Telehealth: Payer: Self-pay | Admitting: General Surgery

## 2019-01-16 NOTE — Telephone Encounter (Signed)
Contacted the patient and spoke with him to pre-screen for zoom visit with Dr Carlean Purl. The patient was a work and unable to talk. He stated he would contact the office back and ask to speak with me to prescreen.

## 2019-01-17 ENCOUNTER — Ambulatory Visit: Payer: Self-pay | Admitting: Internal Medicine

## 2019-01-18 ENCOUNTER — Telehealth: Payer: Self-pay

## 2019-01-18 NOTE — Telephone Encounter (Signed)
I left him a message to call us back to set up another appointment as Dr Carlean Purl and he did not complete his virtual visit yesterday. We could not reach him.

## 2019-01-30 NOTE — Telephone Encounter (Signed)
I have mailed him a letter to call us and make an appointment.

## 2019-03-21 ENCOUNTER — Emergency Department (HOSPITAL_COMMUNITY)
Admission: EM | Admit: 2019-03-21 | Discharge: 2019-03-21 | Disposition: A | Discharge status: Home / Self Care | Payer: Self-pay | Attending: Emergency Medicine | Admitting: Emergency Medicine

## 2019-03-21 ENCOUNTER — Encounter (HOSPITAL_COMMUNITY): Payer: Self-pay

## 2019-03-21 ENCOUNTER — Emergency Department (HOSPITAL_COMMUNITY): Payer: Self-pay

## 2019-03-21 ENCOUNTER — Ambulatory Visit (HOSPITAL_COMMUNITY)
Admission: EM | Admit: 2019-03-21 | Discharge: 2019-03-21 | Disposition: A | Discharge status: Home / Self Care | Payer: Self-pay

## 2019-03-21 ENCOUNTER — Other Ambulatory Visit: Payer: Self-pay

## 2019-03-21 DIAGNOSIS — F17210 Nicotine dependence, cigarettes, uncomplicated: Secondary | ICD-10-CM | POA: Insufficient documentation

## 2019-03-21 DIAGNOSIS — F1290 Cannabis use, unspecified, uncomplicated: Secondary | ICD-10-CM | POA: Insufficient documentation

## 2019-03-21 DIAGNOSIS — K50919 Crohn's disease, unspecified, with unspecified complications: Secondary | ICD-10-CM | POA: Insufficient documentation

## 2019-03-21 DIAGNOSIS — Z8709 Personal history of other diseases of the respiratory system: Secondary | ICD-10-CM | POA: Insufficient documentation

## 2019-03-21 LAB — URINALYSIS, ROUTINE W REFLEX MICROSCOPIC
Bacteria, UA: NONE SEEN
Bilirubin Urine: NEGATIVE
Glucose, UA: NEGATIVE mg/dL
Ketones, ur: 5 mg/dL — AB
Leukocytes,Ua: NEGATIVE
Nitrite: NEGATIVE
Protein, ur: 30 mg/dL — AB
Specific Gravity, Urine: 1.034 — ABNORMAL HIGH (ref 1.005–1.030)
pH: 5 (ref 5.0–8.0)

## 2019-03-21 LAB — COMPREHENSIVE METABOLIC PANEL
ALT: 8 U/L (ref 0–44)
AST: 17 U/L (ref 15–41)
Albumin: 3.3 g/dL — ABNORMAL LOW (ref 3.5–5.0)
Alkaline Phosphatase: 59 U/L (ref 38–126)
Anion gap: 7 (ref 5–15)
BUN: 11 mg/dL (ref 6–20)
CO2: 27 mmol/L (ref 22–32)
Calcium: 9.3 mg/dL (ref 8.9–10.3)
Chloride: 105 mmol/L (ref 98–111)
Creatinine, Ser: 1 mg/dL (ref 0.61–1.24)
GFR calc Af Amer: >60 mL/min (ref >60)
GFR calc non Af Amer: >60 mL/min (ref >60)
Glucose, Bld: 124 mg/dL — ABNORMAL HIGH (ref 70–99)
Potassium: 3.8 mmol/L (ref 3.5–5.1)
Sodium: 139 mmol/L (ref 135–145)
Total Bilirubin: 0.4 mg/dL (ref 0.3–1.2)
Total Protein: 6.9 g/dL (ref 6.5–8.1)

## 2019-03-21 LAB — CBC
HCT: 46 % (ref 39.0–52.0)
Hemoglobin: 14.5 g/dL (ref 13.0–17.0)
MCH: 28.2 pg (ref 26.0–34.0)
MCHC: 31.5 g/dL (ref 30.0–36.0)
MCV: 89.3 fL (ref 80.0–100.0)
Platelets: 403 10*3/uL — ABNORMAL HIGH (ref 150–400)
RBC: 5.15 MIL/uL (ref 4.22–5.81)
RDW: 17.3 % — ABNORMAL HIGH (ref 11.5–15.5)
WBC: 11.1 10*3/uL — ABNORMAL HIGH (ref 4.0–10.5)
nRBC: 0 % (ref 0.0–0.2)

## 2019-03-21 LAB — LIPASE, BLOOD: Lipase: 39 U/L (ref 11–51)

## 2019-03-21 MED ORDER — SODIUM CHLORIDE 0.9% FLUSH: 3.0000 mL | Freq: Once | INTRAVENOUS | Status: DC

## 2019-03-21 MED ORDER — ONDANSETRON HCL 4 MG/2ML IJ SOLN
4.0000 mg | Freq: Once | INTRAMUSCULAR | Status: AC
  Administered 2019-03-21: 4 mg via INTRAVENOUS
  Filled 2019-03-21: qty 2

## 2019-03-21 MED ORDER — IOHEXOL 300 MG/ML  SOLN
100.0000 mL | Freq: Once | INTRAMUSCULAR | Status: AC | PRN
  Administered 2019-03-21: 100 mL via INTRAVENOUS

## 2019-03-21 MED ORDER — MORPHINE SULFATE (PF) 4 MG/ML IV SOLN
4.0000 mg | Freq: Once | INTRAVENOUS | Status: AC
  Administered 2019-03-21: 4 mg via INTRAVENOUS
  Filled 2019-03-21: qty 1

## 2019-03-21 MED ORDER — PREDNISONE 20 MG PO TABS: 40.0000 mg | ORAL_TABLET | Freq: Every day | ORAL | 0 refills | Status: AC

## 2019-03-21 MED ORDER — HYDROCODONE-ACETAMINOPHEN 5-325 MG PO TABS: 1.0000 | ORAL_TABLET | Freq: Four times a day (QID) | ORAL | 0 refills | Status: DC | PRN

## 2019-03-21 MED ORDER — SODIUM CHLORIDE 0.9 % IV BOLUS
1000.0000 mL | Freq: Once | INTRAVENOUS | Status: AC
  Administered 2019-03-21: 1000 mL via INTRAVENOUS

## 2019-03-21 MED ORDER — METHYLPREDNISOLONE SODIUM SUCC 125 MG IJ SOLR
125.0000 mg | Freq: Once | INTRAMUSCULAR | Status: AC
  Administered 2019-03-21: 125 mg via INTRAVENOUS
  Filled 2019-03-21: qty 2

## 2019-03-21 NOTE — ED Notes (Signed)
Patient is crouched over with abdominal pain.  Patient thinks this is crohn's  "acting up"

## 2019-03-21 NOTE — Discharge Instructions (Signed)
Follow-up with GI for your recurrent symptoms.  Take medicines as prescribed.

## 2019-03-21 NOTE — ED Triage Notes (Signed)
Pt reports abd pain and constipation for the past 4 days. Pt has hx of crohn's disease. Denies any n/v. Pt a.o, nad noted

## 2019-03-21 NOTE — ED Notes (Signed)
Patient states thinks he is having a Crohn's Flare and wants to go to ED.  Notified nursing staff.

## 2019-03-21 NOTE — ED Provider Notes (Signed)
[facility] EMERGENCY DEPARTMENT Provider Note   CSN: [id number] Arrival date & time: 03/21/19  1638   History   Chief Complaint Chief Complaint  Patient presents with   Abdominal Pain   Constipation    HPI Eric Castillo. is a 43 y.o. male with past medical history significant for SBO, crohn's disease, asthma who presents for evaluation of abdominal pain.  Patient states he has had intermittent generalized abdominal pain x4 days.  Rates his current pain a 3/10.  Patient states he normally has bowel movements daily however has not had a bowel movement over the last 4 days.  He denies any fever, chills, nausea, vomiting, chest pain, shortness of breath, dysuria, flank pain, melena, hematochezia, hematemesis.  Patient denies prior history of abdominal surgeries.  He is not followed by GI currently.  Dates he did have outpatient follow-up with the [place] however has not seen them.  States he was supposed to start Remicade for his Crohn's however he has not started this.  Was admitted to the hospital in April 2020 for Crohn's flare despite oral steroids.  Denies additional aggravating or alleviating factors.    History obtained from patient and past medical records.  No interpreter was used.     HPI  Past Medical History:  Diagnosis Date   Asthma    Crohn's disease ([place])    SBO (small bowel obstruction) [facility])     Patient Active Problem List   Diagnosis Date Noted   Nausea without vomiting    Exacerbation of Crohn's disease with complication ([place]) 46/50/3546   Crohn's disease ([place]) 12/26/2018   Abdominal pain    Family history of colon cancer in father - 50's 12/21/2018   Tobacco use    Crohn's disease of ileum with intestinal obstruction ([place])    SBO (small bowel obstruction) ([place]) 05/03/2018   Asthma 05/03/2018    Past Surgical History:  Procedure Laterality Date   BIOPSY  12/28/2018   Procedure: BIOPSY;  Surgeon: Mauri Pole,  MD;  Location: [facility];  Service: Endoscopy;;   COLONOSCOPY WITH PROPOFOL N/A 12/28/2018   Procedure: COLONOSCOPY WITH PROPOFOL;  Surgeon: Mauri Pole, MD;  Location: [facility];  Service: Endoscopy;  Laterality: N/A;   MASS EXCISION     UPPER BACK         Home Medications    Prior to Admission medications   Medication Sig Start Date End Date Taking? Authorizing Provider  albuterol (PROVENTIL HFA;VENTOLIN HFA) 108 (90 Base) MCG/ACT inhaler Inhale 1-2 puffs into the lungs every 6 (six) hours as needed for wheezing or shortness of breath.    [provider]  HYDROcodone-acetaminophen (NORCO/VICODIN) 5-325 MG tablet Take 1 tablet by mouth every 6 (six) hours as needed. 03/21/19   Scottlynn Lindell A, PA-C  predniSONE (DELTASONE) 20 MG tablet Take 2 tablets (40 mg total) by mouth daily for 6 days. 03/21/19 03/27/19  Ahriana Gunkel A, PA-C    Family History Family History  Problem Relation Age of Onset   Crohn's disease Mother    Heart attack Mother        In her late 15s.  This was the cause of her death.   Colon cancer Father        In his mid-to-late 76s.  As of 2020, patient in his early 106s and cancer said to be in remission.    Social History Social History   Tobacco Use   Smoking status: Current Every Day Smoker  Packs/day: 0.50    Years: 21.00    Pack years: 10.50    Types: Cigarettes   Smokeless tobacco: Never Used  Substance Use Topics   Alcohol use: Yes   Drug use: Yes    Types: Marijuana     Allergies   Patient has no known allergies.   Review of Systems Review of Systems  Constitutional: Negative.   HENT: Negative.   Respiratory: Negative.   Cardiovascular: Negative.   Gastrointestinal: Positive for abdominal pain and constipation. Negative for abdominal distention, anal bleeding, blood in stool, diarrhea, nausea, rectal pain and vomiting.  Genitourinary: Negative.   Musculoskeletal: Negative.   Skin: Negative.     Neurological: Negative.   All other systems reviewed and are negative.  Physical Exam Updated Vital Signs BP 138/77    Pulse 74    Temp 98.2 F (36.8 C) (Oral)    Resp 16    SpO2 95%   Physical Exam Vitals signs and nursing note reviewed.  Constitutional:      General: He is not in acute distress.    Appearance: He is well-developed. He is not ill-appearing, toxic-appearing or diaphoretic.  HENT:     Head: Normocephalic and atraumatic.     Mouth/Throat:     Mouth: Mucous membranes are moist.     Pharynx: Oropharynx is clear.  Eyes:     Pupils: Pupils are equal, round, and reactive to light.  Neck:     Musculoskeletal: Normal range of motion and neck supple.  Cardiovascular:     Rate and Rhythm: Normal rate and regular rhythm.     Heart sounds: Normal heart sounds. No murmur. No friction rub.  Pulmonary:     Effort: Pulmonary effort is normal. No respiratory distress.     Breath sounds: Normal breath sounds.     Comments: Clear to auscultation bilaterally.  Speaks in full sentences without difficulty. Abdominal:     General: Bowel sounds are normal. There is no distension.     Palpations: Abdomen is soft.     Tenderness: There is generalized abdominal tenderness. There is no right CVA tenderness, left CVA tenderness, guarding or rebound.     Hernia: No hernia is present.     Comments: Soft without rebound or guarding.  Generalized tenderness to palpation.  Musculoskeletal: Normal range of motion.  Skin:    General: Skin is warm and dry.     Capillary Refill: Capillary refill takes less than 2 seconds.     Comments: Brisk capillary refill.  Neurological:     Mental Status: He is alert.     Comments: No facial droop.  Cranial nerves II through XII grossly intact.    ED Treatments / Results  Labs (all labs ordered are listed, but only abnormal results are displayed) Labs Reviewed  COMPREHENSIVE METABOLIC PANEL - Abnormal; Notable for the following components:       Result Value   Glucose, Bld 124 (*)    Albumin 3.3 (*)    All other components within normal limits  CBC - Abnormal; Notable for the following components:   WBC 11.1 (*)    RDW 17.3 (*)    Platelets 403 (*)    All other components within normal limits  URINALYSIS, ROUTINE W REFLEX MICROSCOPIC - Abnormal; Notable for the following components:   Specific Gravity, Urine 1.034 (*)    Hgb urine dipstick SMALL (*)    Ketones, ur 5 (*)    Protein, ur 30 (*)  All other components within normal limits  LIPASE, BLOOD    EKG None  Radiology Ct Abdomen Pelvis W Contrast  Result Date: 03/21/2019 CLINICAL DATA:  43 year old male with abdominal pain and constipation. History of Crohn's disease. EXAM: CT ABDOMEN AND PELVIS WITH CONTRAST TECHNIQUE: Multidetector CT imaging of the abdomen and pelvis was performed using the standard protocol following bolus administration of intravenous contrast. CONTRAST:  176m OMNIPAQUE IOHEXOL 300 MG/ML  SOLN COMPARISON:  CT of the abdomen pelvis dated 12/26/2018 FINDINGS: Lower chest: The visualized lung bases are clear. No intra-abdominal free air. Small free fluid within the pelvis. Hepatobiliary: There is a 2 cm cyst in the right lobe of the liver. Additional smaller hypodense lesion in the left lobe of the liver is not well characterized but may represent a cyst or hemangioma. There is mild periportal edema. No calcified gallstone. Pancreas: Unremarkable. No pancreatic ductal dilatation or surrounding inflammatory changes. Spleen: Normal in size without focal abnormality. Adrenals/Urinary Tract: The adrenal glands, kidneys, and the visualized ureters and urinary bladder appear unremarkable. Mild thickened appearance of the bladder wall, likely reactive to inflammatory changes of the adjacent distal small bowel. Stomach/Bowel: There is long segment inflamed loop of distal small bowel involving the distal and terminal ileum most consistent with Crohn's disease. There  is a focal area of apparent extraluminal extension of inflammatory process in the pelvis (series 3, image 63 and coronal series 6 image 74) which may represent an area of phlegmonous change or development of adhesions/scarring versus fistulous track. Mildly dilated loops of small bowel measure up to 3.4 cm and may represent a reactive ileus versus a degree of obstruction. The appendix is normal. Vascular/Lymphatic: The abdominal aorta and IVC are unremarkable. No portal venous gas. Several mildly enlarged lymph nodes in the pelvis, likely reactive. Reproductive: The prostate and seminal vesicles are grossly unremarkable. Other: None Musculoskeletal: No acute or significant osseous findings. IMPRESSION: 1. Findings of recurrence or flare-up of Crohn's disease with involvement of the distal and terminal ileum. 2. Mildly dilated small bowel loops may represent a degree of obstruction or associated ileus. 3. A focal area of extraluminal extension of inflammatory process in the pelvis may represent an area of phlegmonous change or development of adhesions/scarring versus fistulous track. Electronically Signed   By: AAnner CreteM.D.   On: 03/21/2019 20:03    Procedures Procedures (including critical care time)  Medications Ordered in ED Medications  sodium chloride flush (NS) 0.9 % injection 3 mL (0 mLs Intravenous Hold 03/21/19 1819)  sodium chloride 0.9 % bolus 1,000 mL (0 mLs Intravenous Stopped 03/21/19 1910)  iohexol (OMNIPAQUE) 300 MG/ML solution 100 mL (100 mLs Intravenous Contrast Given 03/21/19 1930)  ondansetron (ZOFRAN) injection 4 mg (4 mg Intravenous Given 03/21/19 2050)  morphine 4 MG/ML injection 4 mg (4 mg Intravenous Given 03/21/19 2050)  methylPREDNISolone sodium succinate (SOLU-MEDROL) 125 mg/2 mL injection 125 mg (125 mg Intravenous Given 03/21/19 2050)   Initial Impression / Assessment and Plan / ED Course  I have reviewed the triage vital signs and the nursing notes.  Pertinent labs  & imaging results that were available during my care of the patient were reviewed by me and considered in my medical decision making (see chart for details).   43year old male appears otherwise well presents for evaluation of abdominal pain.  Intermittent cramping abdominal pain x4 days.  Has also had constipation.  He has been passing flatulence.  Tolerating p.o. intake at home without emesis.  States this feels  similar to his previous episodes of Crohn's flares.  States he was last admitted in April for similar symptoms.  He does not a follow-up with GI secondary to lack of insurance.  Heart and lungs clear.  Abdomen with generalized tenderness without rebound or guarding.  He has a nonsurgical abdomen.  Patient refusing GU exam.  Discussed risk versus benefit.  Patient voiced understanding risk versus benefit and declines GU exam at this time.  Denies any melena or hematochezia at home.  Current pain an 8/10.  No radiation.  Urinating without difficulty.  Labs obtained from triage.  Labs personally reviewed: CBC with mild leukocytosis at 38.4 Metabolic panel with mild hyperglycemia at 124 Urinalysis negative for infection Lipase 39 CT AP with Crohn's flare.  There is mild dilation of bowel loops possibly resembling obstruction versus reactive ileus.  There is also inflammatory process in pelvis possibly adhesions versus fistula versus phlegmon versus scarring.  On reevaluation pain well controlled with 1 dose of morphine.  Now currently radiated a 2/10.  On reevaluation abdomen soft, nontender without rebound or guarding.  He is tolerating p.o. intake in ED without difficulty.  Discussed CT findings with patient. Patient would like trial of outpatient management is not want inpatient hospitalization.  Discussed risk versus benefit of outpatient versus inpatient management for Crohn's flare with possible ileus versus developing SBO.  Discussed with patient we will need to contact GI given CT readings  for possible ileus versus small bowel obstruction  Consulted with Dr. Carlean Purl,  GI given patient CT reading. He feels that if patient is tolerating p.o. intake and pain is well managed he can follow-up outpatient.  Does not think patient needs inpatient management at this time.  Discussed plan with patient.  Patient is amenable to plan.  Will DC home with steroids and pain management.  Patient is nontoxic, nonseptic appearing, in no apparent distress.  Patient's pain and other symptoms adequately managed in emergency department.  Fluid bolus given.  Labs, imaging and vitals reviewed.  Patient does not meet the SIRS or Sepsis criteria.  On repeat exam patient does not have a surgical abdomin and there are no peritoneal signs.  No indication of appendicitis, bowel perforation, cholecystitis, diverticulitis Patient discharged home with symptomatic treatment and given strict instructions for follow-up with their primary care physician.  I have also discussed reasons to return immediately to the ER.  Patient expresses understanding and agrees with plan.  The patient has been appropriately medically screened and/or stabilized in the ED. I have low suspicion for any other emergent medical condition which would require further screening, evaluation or treatment in the ED or require inpatient management.  Patient is hemodynamically stable and in no acute distress.  Patient able to ambulate in department prior to ED.  Evaluation does not show acute pathology that would require ongoing or additional emergent interventions while in the emergency department or further inpatient treatment.  I have discussed the diagnosis with the patient and answered all questions.  Pain is been managed while in the emergency department and patient has no further complaints prior to discharge.  Patient is comfortable with plan discussed in room and is stable for discharge at this time.  I have discussed strict return precautions  for returning to the emergency department.  Patient was encouraged to follow-up with PCP/specialist refer to at discharge.      Final Clinical Impressions(s) / ED Diagnoses   Final diagnoses:  Exacerbation of Crohn's disease with complication ([place])  ED Discharge Orders         Ordered    HYDROcodone-acetaminophen (NORCO/VICODIN) 5-325 MG tablet  Every 6 hours PRN     03/21/19 2157    predniSONE (DELTASONE) 20 MG tablet  Daily     03/21/19 2157           Elford Evilsizer A, PA-C 03/21/19 2251    Hayden Rasmussen, MD 03/22/19 1556

## 2019-03-21 NOTE — ED Notes (Signed)
Patient Alert and oriented to baseline. Stable and ambulatory to baseline. Patient verbalized understanding of the discharge instructions.  Patient belongings were taken by the patient.   

## 2019-03-27 ENCOUNTER — Telehealth: Payer: Self-pay | Admitting: Internal Medicine

## 2019-03-27 NOTE — Telephone Encounter (Signed)
Will this be okay?

## 2019-03-27 NOTE — Telephone Encounter (Signed)
No refill.  Patient has not kept his follow up appts. This was prescribed by the ED.

## 2019-03-27 NOTE — Telephone Encounter (Signed)
Agree  Other option is to have him see an APP sooner

## 2019-03-27 NOTE — Telephone Encounter (Signed)
Patient would like a refill for predniSONE (DELTASONE) 20 MG he got it when he went to the ED. He said he would at lease like some to last until his upcoming appt.

## 2019-03-27 NOTE — Telephone Encounter (Signed)
I spoke with Eric Castillo and put him on for [name] 04/10/2019 at 1:30pm. No sooner APP appointments available. He is going to take his last prednisone 35m tonight. He has been on it BID and he said he feels better. His question now is what can he do to help himself until his appointment.

## 2019-03-28 NOTE — Telephone Encounter (Signed)
Patient can be added to the cancellation list and he is welcome to call and check for cancellations

## 2019-03-28 NOTE — Telephone Encounter (Signed)
Left him a detailed message to call back to check on cancellations.

## 2019-03-28 NOTE — Telephone Encounter (Signed)
I have added him to the wait list.

## 2019-04-04 ENCOUNTER — Emergency Department (HOSPITAL_COMMUNITY): Payer: Self-pay

## 2019-04-04 ENCOUNTER — Other Ambulatory Visit: Payer: Self-pay

## 2019-04-04 ENCOUNTER — Emergency Department (HOSPITAL_COMMUNITY)
Admission: EM | Admit: 2019-04-04 | Discharge: 2019-04-04 | Disposition: A | Discharge status: Home / Self Care | Payer: Self-pay | Attending: Emergency Medicine | Admitting: Emergency Medicine

## 2019-04-04 ENCOUNTER — Encounter (HOSPITAL_COMMUNITY): Payer: Self-pay | Admitting: Emergency Medicine

## 2019-04-04 DIAGNOSIS — J45909 Unspecified asthma, uncomplicated: Secondary | ICD-10-CM | POA: Insufficient documentation

## 2019-04-04 DIAGNOSIS — F17210 Nicotine dependence, cigarettes, uncomplicated: Secondary | ICD-10-CM | POA: Insufficient documentation

## 2019-04-04 DIAGNOSIS — K5000 Crohn's disease of small intestine without complications: Secondary | ICD-10-CM | POA: Insufficient documentation

## 2019-04-04 DIAGNOSIS — R1084 Generalized abdominal pain: Secondary | ICD-10-CM | POA: Insufficient documentation

## 2019-04-04 DIAGNOSIS — K5900 Constipation, unspecified: Secondary | ICD-10-CM | POA: Insufficient documentation

## 2019-04-04 LAB — COMPREHENSIVE METABOLIC PANEL
ALT: 8 U/L (ref 0–44)
AST: 12 U/L — ABNORMAL LOW (ref 15–41)
Albumin: 2.9 g/dL — ABNORMAL LOW (ref 3.5–5.0)
Alkaline Phosphatase: 46 U/L (ref 38–126)
Anion gap: 9 (ref 5–15)
BUN: 11 mg/dL (ref 6–20)
CO2: 28 mmol/L (ref 22–32)
Calcium: 8.9 mg/dL (ref 8.9–10.3)
Chloride: 105 mmol/L (ref 98–111)
Creatinine, Ser: 0.96 mg/dL (ref 0.61–1.24)
GFR calc Af Amer: >60 mL/min (ref >60)
GFR calc non Af Amer: >60 mL/min (ref >60)
Glucose, Bld: 90 mg/dL (ref 70–99)
Potassium: 3.4 mmol/L — ABNORMAL LOW (ref 3.5–5.1)
Sodium: 142 mmol/L (ref 135–145)
Total Bilirubin: 0.4 mg/dL (ref 0.3–1.2)
Total Protein: 6.1 g/dL — ABNORMAL LOW (ref 6.5–8.1)

## 2019-04-04 LAB — CBC WITH DIFFERENTIAL/PLATELET
Abs Immature Granulocytes: 0.05 10*3/uL (ref 0.00–0.07)
Basophils Absolute: 0 10*3/uL (ref 0.0–0.1)
Basophils Relative: 0 %
Eosinophils Absolute: 0.4 10*3/uL (ref 0.0–0.5)
Eosinophils Relative: 3 %
HCT: 42.4 % (ref 39.0–52.0)
Hemoglobin: 13.6 g/dL (ref 13.0–17.0)
Immature Granulocytes: 0 %
Lymphocytes Relative: 6 %
Lymphs Abs: 0.8 10*3/uL (ref 0.7–4.0)
MCH: 28.6 pg (ref 26.0–34.0)
MCHC: 32.1 g/dL (ref 30.0–36.0)
MCV: 89.3 fL (ref 80.0–100.0)
Monocytes Absolute: 1 10*3/uL (ref 0.1–1.0)
Monocytes Relative: 7 %
Neutro Abs: 12.5 10*3/uL — ABNORMAL HIGH (ref 1.7–7.7)
Neutrophils Relative %: 84 %
Platelets: 352 10*3/uL (ref 150–400)
RBC: 4.75 MIL/uL (ref 4.22–5.81)
RDW: 17.3 % — ABNORMAL HIGH (ref 11.5–15.5)
WBC: 14.9 10*3/uL — ABNORMAL HIGH (ref 4.0–10.5)
nRBC: 0 % (ref 0.0–0.2)

## 2019-04-04 LAB — URINALYSIS, ROUTINE W REFLEX MICROSCOPIC
Bilirubin Urine: NEGATIVE
Glucose, UA: NEGATIVE mg/dL
Hgb urine dipstick: NEGATIVE
Ketones, ur: 5 mg/dL — AB
Leukocytes,Ua: NEGATIVE
Nitrite: NEGATIVE
Protein, ur: NEGATIVE mg/dL
Specific Gravity, Urine: >1.046 — ABNORMAL HIGH (ref 1.005–1.030)
pH: 6 (ref 5.0–8.0)

## 2019-04-04 LAB — LIPASE, BLOOD: Lipase: 25 U/L (ref 11–51)

## 2019-04-04 LAB — LACTIC ACID, PLASMA: Lactic Acid, Venous: 0.7 mmol/L (ref 0.5–1.9)

## 2019-04-04 MED ORDER — PREDNISONE 20 MG PO TABS
40.0000 mg | ORAL_TABLET | Freq: Once | ORAL | Status: AC
  Administered 2019-04-04: 11:00:00 40 mg via ORAL
  Filled 2019-04-04: qty 2

## 2019-04-04 MED ORDER — ONDANSETRON HCL 4 MG/2ML IJ SOLN
4.0000 mg | Freq: Once | INTRAMUSCULAR | Status: AC
  Administered 2019-04-04: 4 mg via INTRAVENOUS
  Filled 2019-04-04: qty 2

## 2019-04-04 MED ORDER — SODIUM CHLORIDE 0.9 % IV BOLUS
1000.0000 mL | Freq: Once | INTRAVENOUS | Status: AC
  Administered 2019-04-04: 08:00:00 1000 mL via INTRAVENOUS

## 2019-04-04 MED ORDER — PREDNISONE 10 MG PO TABS: ORAL_TABLET | ORAL | 0 refills | Status: AC

## 2019-04-04 MED ORDER — IOPAMIDOL (ISOVUE-300) INJECTION 61%
100.0000 mL | Freq: Once | INTRAVENOUS | Status: AC | PRN
  Administered 2019-04-04: 08:00:00 100 mL via INTRAVENOUS

## 2019-04-04 NOTE — Discharge Instructions (Signed)
Your work-up today showed evidence of Crohn's disease likely causing the Crohn's flare with your abdominal discomfort and constipation.  Please take the steroids for the next 4 weeks starting today.  He received today's dose already.  Please use nausea medication as well to help with any nausea.  Please try and stay hydrated.  There was no evidence of obstruction on your imaging.  Please follow-up with your gastroenterologist next week as we discussed and if any symptoms change or worsen, please return to the nearest emergency department.

## 2019-04-04 NOTE — ED Notes (Signed)
Patient returned back from CT.

## 2019-04-04 NOTE — ED Triage Notes (Signed)
Pt states he is not able to have a BM for the past 7 days, pt states he is having a lot of abd pain and feeling heavy. Pt was seen during the past few weeks and sent home with Prednisone with no relief.

## 2019-04-04 NOTE — ED Notes (Signed)
Pt drinking water and apple juice. Tolerating well.

## 2019-04-04 NOTE — ED Provider Notes (Signed)
[facility] EMERGENCY DEPARTMENT Provider Note   CSN: [id number] Arrival date & time: 04/04/19  3299     History   Chief Complaint Chief Complaint  Patient presents with   Fecal Impaction    HPI Eric Chenault. is a 42 y.o. male.        The history is provided by the patient and medical records. No language interpreter was used.  Abdominal Pain Pain location:  Generalized Pain quality: cramping, fullness, heavy and sharp   Pain radiates to:  Does not radiate Pain severity:  Severe Onset quality:  Gradual Duration:  7 days Timing:  Constant Progression:  Worsening Chronicity:  Recurrent Relieved by:  Nothing Worsened by:  Palpation and eating Ineffective treatments:  None tried Associated symptoms: flatus (abscence) and nausea   Associated symptoms: no chest pain, no chills, no constipation, no cough, no diarrhea, no dysuria, no fatigue, no fever, no shortness of breath and no vomiting     Past Medical History:  Diagnosis Date   Asthma    Crohn's disease ([place])    SBO (small bowel obstruction) (HCC)     Patient Active Problem List   Diagnosis Date Noted   Nausea without vomiting    Exacerbation of Crohn's disease with complication ([place]) 24/26/8341   Crohn's disease ([place]) 12/26/2018   Abdominal pain    Family history of colon cancer in father - 50's 12/21/2018   Tobacco use    Crohn's disease of ileum with intestinal obstruction (HCC)    SBO (small bowel obstruction) ([place]) 05/03/2018   Asthma 05/03/2018    Past Surgical History:  Procedure Laterality Date   BIOPSY  12/28/2018   Procedure: BIOPSY;  Surgeon: Mauri Pole, MD;  Location: [place] [facility];  Service: Endoscopy;;   COLONOSCOPY WITH PROPOFOL N/A 12/28/2018   Procedure: COLONOSCOPY WITH PROPOFOL;  Surgeon: Mauri Pole, MD;  Location: [place] [facility];  Service: Endoscopy;  Laterality: N/A;   MASS EXCISION     UPPER BACK         Home Medications     Prior to Admission medications   Medication Sig Start Date End Date Taking? Authorizing Provider  albuterol (PROVENTIL HFA;VENTOLIN HFA) 108 (90 Base) MCG/ACT inhaler Inhale 1-2 puffs into the lungs every 6 (six) hours as needed for wheezing or shortness of breath.    [provider]  HYDROcodone-acetaminophen (NORCO/VICODIN) 5-325 MG tablet Take 1 tablet by mouth every 6 (six) hours as needed. 03/21/19   Henderly, Britni A, PA-C    Family History Family History  Problem Relation Age of Onset   Crohn's disease Mother    Heart attack Mother        In her late 24s.  This was the cause of her death.   Colon cancer Father        In his mid-to-late 7s.  As of 2020, patient in his early 20s and cancer said to be in remission.    Social History Social History   Tobacco Use   Smoking status: Current Every Day Smoker    Packs/day: 0.50    Years: 21.00    Pack years: 10.50    Types: Cigarettes   Smokeless tobacco: Never Used  Substance Use Topics   Alcohol use: Yes   Drug use: Yes    Types: Marijuana     Allergies   Patient has no known allergies.   Review of Systems Review of Systems  Constitutional: Negative for chills, diaphoresis, fatigue and fever.  HENT: Negative for congestion and rhinorrhea.   Eyes: Negative for visual disturbance.  Respiratory: Negative for cough, chest tightness, shortness of breath, wheezing and stridor.   Cardiovascular: Negative for chest pain, palpitations and leg swelling.  Gastrointestinal: Positive for abdominal pain, flatus (abscence) and nausea. Negative for constipation, diarrhea and vomiting.  Genitourinary: Negative for dysuria, flank pain and frequency.  Musculoskeletal: Negative for back pain, neck pain and neck stiffness.  Skin: Negative for rash and wound.  Neurological: Negative for dizziness, seizures, weakness, light-headedness, numbness and headaches.  Psychiatric/Behavioral: Negative for agitation.  All other  systems reviewed and are negative.    Physical Exam Updated Vital Signs BP (!) 147/91 (BP Location: Right Arm)    Pulse 92    Temp 97.9 F (36.6 C) (Oral)    Resp 18    Ht 6' (1.829 m)    Wt 61.2 kg    SpO2 100%    BMI 18.31 kg/m   Physical Exam Vitals signs and nursing note reviewed.  Constitutional:      General: He is not in acute distress.    Appearance: Normal appearance. He is well-developed. He is not ill-appearing, toxic-appearing or diaphoretic.  HENT:     Head: Normocephalic and atraumatic.     Nose: Nose normal. No congestion or rhinorrhea.     Mouth/Throat:     Pharynx: No oropharyngeal exudate or posterior oropharyngeal erythema.  Eyes:     Conjunctiva/sclera: Conjunctivae normal.     Pupils: Pupils are equal, round, and reactive to light.  Neck:     Musculoskeletal: Neck supple. No muscular tenderness.  Cardiovascular:     Rate and Rhythm: Normal rate and regular rhythm.     Pulses: Normal pulses.     Heart sounds: No murmur.  Pulmonary:     Effort: Pulmonary effort is normal. No respiratory distress.     Breath sounds: Normal breath sounds. No wheezing, rhonchi or rales.  Chest:     Chest wall: No tenderness.  Abdominal:     General: Abdomen is flat.     Palpations: Abdomen is soft.     Tenderness: There is abdominal tenderness. There is no right CVA tenderness or left CVA tenderness.  Musculoskeletal:        General: No tenderness or signs of injury.     Right lower leg: No edema.     Left lower leg: No edema.  Skin:    General: Skin is warm and dry.     Capillary Refill: Capillary refill takes less than 2 seconds.     Findings: No rash.  Neurological:     General: No focal deficit present.     Mental Status: He is alert.  Psychiatric:        Mood and Affect: Mood normal.      ED Treatments / Results  Labs (all labs ordered are listed, but only abnormal results are displayed) Labs Reviewed  CBC WITH DIFFERENTIAL/PLATELET - Abnormal; Notable  for the following components:      Result Value   WBC 14.9 (*)    RDW 17.3 (*)    Neutro Abs 12.5 (*)    All other components within normal limits  COMPREHENSIVE METABOLIC PANEL - Abnormal; Notable for the following components:   Potassium 3.4 (*)    Total Protein 6.1 (*)    Albumin 2.9 (*)    AST 12 (*)    All other components within normal limits  URINALYSIS, ROUTINE W REFLEX MICROSCOPIC - Abnormal;  Notable for the following components:   Specific Gravity, Urine >1.046 (*)    Ketones, ur 5 (*)    All other components within normal limits  URINE CULTURE  LIPASE, BLOOD  LACTIC ACID, PLASMA    EKG None  Radiology Ct Abdomen Pelvis W Contrast  Result Date: 04/04/2019 CLINICAL DATA:  Abdominal pain.  History of Crohn's disease EXAM: CT ABDOMEN AND PELVIS WITH CONTRAST TECHNIQUE: Multidetector CT imaging of the abdomen and pelvis was performed using the standard protocol following bolus administration of intravenous contrast. CONTRAST:  100 mL Isovue 370 nonionic COMPARISON:  March 21, 2019 FINDINGS: Lower chest: Lung bases are clear. Hepatobiliary: There is a cyst in the right lobe of the liver measuring 2.2 x 1.9 cm, stable. A probable 1 cm hemangioma is seen in the left lobe, less well seen than on recent prior study. No new liver lesions evident. Gallbladder wall is not appreciably thickened. There is no biliary duct dilatation. Pancreas: There is no pancreatic mass or inflammatory focus. Spleen: No splenic lesions are evident. Adrenals/Urinary Tract: Adrenals appear normal bilaterally. Kidneys bilaterally show no evident mass or hydronephrosis on either side. There is no renal or ureteral calculus on either side. Urinary bladder is midline. The wall of the urinary bladder is thickened diffusely. Stomach/Bowel: There remains thickening of the wall of the mid to distal ileum, similar to recent study with areas of wall enhancement. No fistula is demonstrable on this study. More proximal bowel  appears within normal limits with respect to wall thickness. No bowel obstruction is demonstrable. There is no free air or portal venous air. Vascular/Lymphatic: No abdominal aortic aneurysm. No vascular lesions are evident. Major venous structures are patent. Several borderline prominent obturator and iliac lymph nodes appear stable and may well have reactive etiology given the underlying Crohn's disease. Reproductive: Prostate and seminal vesicles appear normal in size and contour. Other: No appendiceal region inflammation is evident on this study. There is no abscess in the abdomen or pelvis. Minimal fluid in the pelvis is adjacent to a loop of inflamed ileum, likely of reactive etiology. No ascites elsewhere. Musculoskeletal: No blastic or lytic bone lesions. No evidence sacroiliitis. No intramuscular or abdominal wall lesions are evident. IMPRESSION: 1. Wall thickening and enhancement of the walls in the mid the distal ileum noted including involvement of the terminal ileum. No frank bowel obstruction. No fistulae. The appearance in the ileum is consistent with known Crohn's disease. 2. Minimal fluid in the pelvis is likely of reactive etiology. Several borderline prominent pelvic lymph nodes likely are reactive etiology. 3. Thickening of the urinary bladder wall is likely indicative of cystitis. Correlation with urinalysis in this regard advised. 4.  No renal or ureteral calculi.  No hydronephrosis. Electronically Signed   By: Lowella Grip III M.D.   On: 04/04/2019 08:41    Procedures Procedures (including critical care time)  Medications Ordered in ED Medications  sodium chloride 0.9 % bolus 1,000 mL (0 mLs Intravenous Stopped 04/04/19 1021)  ondansetron (ZOFRAN) injection 4 mg (4 mg Intravenous Given 04/04/19 0801)  iopamidol (ISOVUE-300) 61 % injection 100 mL (100 mLs Intravenous Contrast Given 04/04/19 0829)  predniSONE (DELTASONE) tablet 40 mg (40 mg Oral Given 04/04/19 1058)     Initial  Impression / Assessment and Plan / ED Course  I have reviewed the triage vital signs and the nursing notes.  Pertinent labs & imaging results that were available during my care of the patient were reviewed by me and considered in  my medical decision making (see chart for details).        Eric Lenzo. is a 43 y.o. male with a past medical history significant for asthma, Crohn's disease, small bowel obstruction, and family history of early colon cancer who presents with abdominal pain, some nausea, no bowel movement in 7 days, no flatus in 7 days.  Patient reports this feels worse than previous visits and feels like he has a bowel obstruction.  He reports that several weeks ago he was seen for similar symptoms but not as severe and was started on prednisone for a 7-day burst.  He reports that this improved symptoms and he is scheduled to see a gastroenterologist for the first time next week.  He reports that for the last week he has had worsened abdominal pain which is up to an 8 or 9 out of 10 in severity across his abdomen.  He reports has not passed any gas and is not able to eat or drink due to worsening pain.  He reports he has some nausea but no significant vomiting.  He reports he has passed no gas in 1 week.  He denies any recent trauma.  No fevers, chills, chest pain, shortness breath, or cough.  No rashes or other complaints.  On exam, abdomen is diffusely tender.  Could not appreciate bowel sounds.  Back and flanks are nontender.  Lungs clear chest nontender.  Patient uncomfortable in the room.  Clinically I am concerned the patient may have a bowel traction in relation to Crohn's.  Although patient had imaging several weeks ago, his symptoms have worsened and he is not passing gas like he was before.  We had a shared decision made conversation and feel that the patient will benefit from rehydration with fluids due to the decreased p.o., nausea medicine, and lab/imaging to further evaluate.   Patient does not want pain medicine initially.  Anticipate reassessment after work-up.     10:57 AM Diagnostic work-up revealed evidence of Crohn's flare.  There is elevated white blood cell count which is actually improved from prior numbers.  Metabolic panel showed normal liver and kidney function.  CT scan does not show obstruction but does show evidence of possible cystitis versus Crohn's flare.  Urinalysis does not show UTI.  There was no obstruction on CT.  No impaction seen.  No aneurysm seen.  Had a shared decision-making conversation with the patient and we agreed that he would likely be stable for discharge home after reassuring work-up.  Patient would like a longer steroid regimen and we discussed a 40, 30, 20, 10, 4-week prednisone taper.  This will get him through seeing his GI physician in the next 2 weeks.   Patient was given a 40 mg dose of prednisone for today and will be p.o. challenge.  He is able to tolerate p.o., we feel he safe for discharge home.  11:37 AM Patient was reassessed and he was able to safely eat and drink without significant worsening of pain after the steroids.  Patient be given a steroid taper as we discussed and will follow-up with GI.  Patient agreed with plan of care and was discharged in good condition with understanding return precautions.    Final Clinical Impressions(s) / ED Diagnoses   Final diagnoses:  Generalized abdominal pain  Crohn's disease of small intestine without complication (HCC)  Constipation, unspecified constipation type    ED Discharge Orders         Ordered  predniSONE (DELTASONE) 10 MG tablet     04/04/19 1139          Clinical Impression: 1. Generalized abdominal pain   2. Crohn's disease of small intestine without complication ([place])   3. Constipation, unspecified constipation type     Disposition: Discharge  Condition: Good  I have discussed the results, Dx and Tx plan with the pt(& family if present).  He/she/they expressed understanding and agree(s) with the plan. Discharge instructions discussed at great length. Strict return precautions discussed and pt &/or family have verbalized understanding of the instructions. No further questions at time of discharge.    New Prescriptions   PREDNISONE (DELTASONE) 10 MG TABLET    Take 4 tablets (40 mg total) by mouth daily for 7 days, THEN 3 tablets (30 mg total) daily for 7 days, THEN 2 tablets (20 mg total) daily for 7 days, THEN 1 tablet (10 mg total) daily for 7 days.    Follow Up: You GI doctor next week     [facility] EMERGENCY DEPARTMENT [address] [id number] mc [place]       Demetrice Combes, Gwenyth Allegra, MD 04/04/19 1140

## 2019-04-05 LAB — URINE CULTURE: Culture: NO GROWTH

## 2019-04-10 ENCOUNTER — Ambulatory Visit (INDEPENDENT_AMBULATORY_CARE_PROVIDER_SITE_OTHER): Payer: Self-pay | Admitting: Physician Assistant

## 2019-04-10 ENCOUNTER — Encounter: Payer: Self-pay | Admitting: Physician Assistant

## 2019-04-10 VITALS — BP 116/52 | HR 100 | Temp 98.7°F | Ht 72.0 in | Wt 139.1 lb

## 2019-04-10 DIAGNOSIS — K56600 Partial intestinal obstruction, unspecified as to cause: Secondary | ICD-10-CM

## 2019-04-10 DIAGNOSIS — K50019 Crohn's disease of small intestine with unspecified complications: Secondary | ICD-10-CM

## 2019-04-10 MED ORDER — PREDNISONE 10 MG PO TABS: 10.0000 mg | ORAL_TABLET | ORAL | 2 refills | Status: DC

## 2019-04-10 NOTE — Progress Notes (Signed)
Subjective:    Patient ID: Eric Castillo., male    DOB: [birth year], 43 y.o.   MRN: [id number]  HPI; [name] is a pleasant 43 year old African-American male, established with Dr. Carlean Purl at the time he was seen in consultation during hospitalization in April 2020.  Patient began having GI symptoms in the fall 2019, was apparently scheduled for colonoscopy in [place] where he was living at the time but did not tolerate the prep and procedure was not done. He was diagnosed with Crohn's ileitis in April 2020.  He was hospitalized with recurrent symptoms of partial small bowel obstruction.  At that time CT showed thickening and inflammation in the region of the terminal ileum concerning for infectious versus inflammatory enteritis, there was some degree of associated bowel obstruction with mild dilation in the small bowel proximal to the area of inflammation. He had colonoscopy with Dr. Silverio Decamp during that stay with finding of a stricture in the terminal ileum and friability of the terminal ileum, remainder of the colon was unremarkable.  Biopsies of the terminal ileum were consistent with focally active chronic nonspecific ileitis.  Biopsies from the right and left colon with no active inflammation.  Patient was treated with steroids, and actually had a Remicade infusion while he was hospitalized with plans to schedule him for follow-up outpatient Remicade. Patient has not been seen by our office since and initially we were unable to reach him. He has had 2 ER visits since then 1 in July, and 1 on 04/04/2019.  Both with complaints of abdominal pain, fullness and inability to have a bowel movement for several days.  He says he has learned that when the symptoms start to back off to a liquid diet, this is generally helpful and within a few days he will start feeling better.  He is also had short courses of prednisone through the ER.  On 04/04/2019 repeat CT was done shows a right hepatic lobe cyst 2.2 x 1.9 cm  and wall thickening of the mid to distal ileum including the terminal ileum.  There was no frank obstruction, no evidence of fistula, he did have several prominent lymph nodes felt to be reactive. He is currently on a taper dose of prednisone, started at 40 mg/day x 1 week, then 30 mg/day x 1 week etc. Labs on 04/04/2019 WBC 14.9 hemoglobin 13.6, lactate within normal limits, albumin 2.9.  He is feeling better on steroids, and currently back to having bowel movements on a daily basis.  He denies any problems with nausea or vomiting, no fevers or chills, no melena or hematochezia.  His weight is down about 15 pounds since onset of symptoms a year ago.  He has recently started a new job, has to wait 90 days for insurance to activate.   Review of Systems Pertinent positive and negative review of systems were noted in the above HPI section.  All other review of systems was otherwise negative. Denies arthralgias .  Outpatient Encounter Medications as of 04/10/2019  Medication Sig  . albuterol (PROVENTIL HFA;VENTOLIN HFA) 108 (90 Base) MCG/ACT inhaler Inhale 1-2 puffs into the lungs every 6 (six) hours as needed for wheezing or shortness of breath.  . predniSONE (DELTASONE) 10 MG tablet Take 4 tablets (40 mg total) by mouth daily for 7 days, THEN 3 tablets (30 mg total) daily for 7 days, THEN 2 tablets (20 mg total) daily for 7 days, THEN 1 tablet (10 mg total) daily for 7 days.  . predniSONE (DELTASONE)  10 MG tablet Take 1 tablet (10 mg total) by mouth as directed. Stay on 40 mg for one more week, then go to 30 mg for two weeks, then 20 mg for two weeks, then 15 mg for two weeks, then 10 mg for two weeks then stop.  . [DISCONTINUED] HYDROcodone-acetaminophen (NORCO/VICODIN) 5-325 MG tablet Take 1 tablet by mouth every 6 (six) hours as needed. (Patient not taking: Reported on 04/10/2019)   No facility-administered encounter medications on file as of 04/10/2019.    No Known Allergies Patient Active Problem  List   Diagnosis Date Noted  . Nausea without vomiting   . Exacerbation of Crohn's disease with complication ([place]) 79/39/0300  . Crohn's disease ([place]) 12/26/2018  . Abdominal pain   . Family history of colon cancer in father - 41's 12/21/2018  . Tobacco use   . Crohn's disease of ileum with intestinal obstruction ([place])   . SBO (small bowel obstruction) ([place]) 05/03/2018  . Asthma 05/03/2018   Social History   Socioeconomic History  . Marital status: Single    Spouse name: Not on file  . Number of children: 0  . Years of education: Not on file  . Highest education level: Not on file  Occupational History    Employer: [employer]  . Financial resource strain: Not on file  . Food insecurity    Worry: Not on file    Inability: Not on file  . Transportation needs    Medical: Not on file    Non-medical: Not on file  Tobacco Use  . Smoking status: Current Every Day Smoker    Packs/day: 0.50    Years: 21.00    Pack years: 10.50    Types: Cigarettes, Cigars  . Smokeless tobacco: Never Used  Substance and Sexual Activity  . Alcohol use: Not Currently  . Drug use: Yes    Types: Marijuana  . Sexual activity: Not on file  Lifestyle  . Physical activity    Days per week: Not on file    Minutes per session: Not on file  . Stress: Not on file  Relationships  . Social Herbalist on phone: Not on file    Gets together: Not on file    Attends religious service: Not on file    Active member of club or organization: Not on file    Attends meetings of clubs or organizations: Not on file    Relationship status: Not on file  . Intimate partner violence    Fear of current or ex partner: Not on file    Emotionally abused: Not on file    Physically abused: Not on file    Forced sexual activity: Not on file  Other Topics Concern  . Not on file  Social History Narrative   Engaged. Lives with fiance and father in [place].   HS grad + 1 year college   +  ETOH, marijuana and smoker   Starting new job as Therapist, music     Mr. Ocain's family history includes Colon cancer in his father; Crohn's disease in his mother; Heart attack in his mother; Heart disease in his father.      Objective:    Vitals:   04/10/19 1350  BP: (!) 116/52  Pulse: 100  Temp: 98.7 F (37.1 C)    Physical Exam Well-developed well-nourished thin AA malein no acute distress.  Height, Weight,139 BMI 18.8  HEENT; nontraumatic normocephalic, EOMI, PE R LA, sclera  anicteric. Oropharynx;benign Neck; supple, no JVD Cardiovascular; regular rate and rhythm with S1-S2, no murmur rub or gallop Pulmonary; Clear bilaterally Abdomen; soft, mildly tender RLQ with some fullness,, nondistended, no palpable mass or hepatosplenomegaly, bowel sounds are active Rectal; not done Skin; benign exam, no jaundice rash or appreciable lesions Extremities; no clubbing cyanosis or edema skin warm and dry Neuro/Psych; alert and oriented x4, grossly nonfocal mood and affect appropriate       Assessment & Plan:   #66 43 year old African-American male with Crohn's ileitis and stricture with recurrent episodes of partial small bowel obstruction.  Patient has generally responded to steroids suggesting at least a partial inflammatory component. He was initiated on Remicade while hospitalized in April 2020, received 1 dose, but has not had GI follow-up since.  Plan; continue prednisone 40 mg for 1 more week then decrease to 30 mg p.o. every morning x2 weeks, then 20 mg p.o. every morning x2 weeks, then 15 mg p.o. daily x2 weeks then 10 mg p.o. daily x2 weeks.  Patient was advised that if he has increase in symptoms as he tapers prednisone to call our office for advice  We discussed diet, and converting to clear liquids anytime he feels increase in symptoms.  Advised he also try Ensure or boost high-protein twice daily during these episodes.  Patient needs to get reinitiated on  Remicade 5 mg/kg.  Will need dose at 02 and 6 weeks, then q. 8 weeks. Will investigate possibility of [employer] patient assistance program until he has insurance coverage. He will need follow-up office visit with Dr. Carlean Purl, after Remicade has been initiated.  [name] Genia Harold PA-C 04/10/2019   Cc: Wilber Oliphant, MD

## 2019-04-10 NOTE — Patient Instructions (Signed)
If you are age 42 or older, your body mass index should be between 23-30. Your Body mass index is 18.87 kg/m. If this is out of the aforementioned range listed, please consider follow up with your Primary Care Provider.  If you are age 23 or younger, your body mass index should be between 19-25. Your Body mass index is 18.87 kg/m. If this is out of the aformentioned range listed, please consider follow up with your Primary Care Provider.   We have sent the following medications to your pharmacy for you to pick up at your convenience: Prednisone  Dr. Celesta Aver nurse will be in touch regarding Remicade.  Thank you for choosing me and [facility].   [name] Esterwood, PA-C

## 2019-04-12 ENCOUNTER — Telehealth: Payer: Self-pay

## 2019-04-12 NOTE — Telephone Encounter (Signed)
I spoke with the patient.  He is uninsured at this time, but not Medicaid eligible.  We will try and have him apply to the [employer] assistance program.  He understands that I will send him the forms and he is asked that he fill in all of the required information and bring all forms back to me ASAP.  He understands that we will see if the medication can be approved through [employer] assistance.   Forms filled and and mailed to the patient with instructions to return to the office for [place] to send

## 2019-04-12 NOTE — Telephone Encounter (Signed)
-----   Message from Gatha Mayer, MD sent at 04/11/2019 12:36 PM EDT ----- Regarding: RE: Remicade I would try for Cimzia or Humira  Whichever is easier to get for free  Unfortunately only so much we can do without insurance  If he is not working he should apply for Medicaid also  Thanks to you both  Glendell Docker  ----- Message ----- From: Alfredia Ferguson, PA-C Sent: 04/11/2019  12:20 PM EDT To: Marlon Pel, RN, Gatha Mayer, MD Subject: RE: Remicade                                   Dr Carlean Purl- need you to weigh in.Madaline Brilliant to go ahead with Humira if we can get him coverage? He needs to get started on A biologic ASAP ----- Message ----- From: Marlon Pel, RN Sent: 04/11/2019  11:42 AM EDT To: Alfredia Ferguson, PA-C, Gatha Mayer, MD Subject: RE: Remicade                                   I am sorry.  I do not have any options.  Hospital will not infuse with no insurance. He may qualify for the assistance, but that only covers drug if approved.  It will not cover any of the infusion costs.  Patient will not be able to stay on schedule.  Perhaps we could try Humira they have a better assistance program.  Remicade is a lottery and he may be on the waiting list for months.  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 He does not currently have insurance , will have in a couple months- just started new job.  He had one dose while hospitalized in 4 /20 -think need to start over with 0, 2 ,6 weeks Had Hep B , quantiferon  etc 4/20 Please sort out options for pt assistance etc

## 2019-04-20 ENCOUNTER — Ambulatory Visit: Payer: Self-pay | Admitting: Internal Medicine

## 2019-08-24 ENCOUNTER — Telehealth: Payer: Self-pay | Admitting: General Surgery

## 2019-08-24 NOTE — Telephone Encounter (Signed)
The patient was contacted and would like to schedule an appointment to come in. Unable to schedule today he will call back on Monday

## 2019-10-30 ENCOUNTER — Other Ambulatory Visit: Payer: Self-pay

## 2019-10-30 ENCOUNTER — Emergency Department (HOSPITAL_COMMUNITY): Payer: Self-pay

## 2019-10-30 ENCOUNTER — Encounter (HOSPITAL_COMMUNITY): Payer: Self-pay | Admitting: Emergency Medicine

## 2019-10-30 ENCOUNTER — Emergency Department (HOSPITAL_COMMUNITY)
Admission: EM | Admit: 2019-10-30 | Discharge: 2019-10-30 | Disposition: A | Discharge status: Home / Self Care | Payer: Self-pay | Attending: Emergency Medicine | Admitting: Emergency Medicine

## 2019-10-30 DIAGNOSIS — F17210 Nicotine dependence, cigarettes, uncomplicated: Secondary | ICD-10-CM | POA: Insufficient documentation

## 2019-10-30 DIAGNOSIS — K50118 Crohn's disease of large intestine with other complication: Secondary | ICD-10-CM | POA: Insufficient documentation

## 2019-10-30 DIAGNOSIS — J45909 Unspecified asthma, uncomplicated: Secondary | ICD-10-CM | POA: Insufficient documentation

## 2019-10-30 LAB — URINALYSIS, ROUTINE W REFLEX MICROSCOPIC
Bacteria, UA: NONE SEEN
Glucose, UA: NEGATIVE mg/dL
Hgb urine dipstick: NEGATIVE
Ketones, ur: 20 mg/dL — AB
Leukocytes,Ua: NEGATIVE
Nitrite: NEGATIVE
Protein, ur: 30 mg/dL — AB
Specific Gravity, Urine: 1.033 — ABNORMAL HIGH (ref 1.005–1.030)
pH: 5 (ref 5.0–8.0)

## 2019-10-30 LAB — CBC
HCT: 41.2 % (ref 39.0–52.0)
Hemoglobin: 13.5 g/dL (ref 13.0–17.0)
MCH: 28 pg (ref 26.0–34.0)
MCHC: 32.8 g/dL (ref 30.0–36.0)
MCV: 85.5 fL (ref 80.0–100.0)
Platelets: 468 10*3/uL — ABNORMAL HIGH (ref 150–400)
RBC: 4.82 MIL/uL (ref 4.22–5.81)
RDW: 14.8 % (ref 11.5–15.5)
WBC: 6.8 10*3/uL (ref 4.0–10.5)
nRBC: 0 % (ref 0.0–0.2)

## 2019-10-30 LAB — COMPREHENSIVE METABOLIC PANEL
ALT: 7 U/L (ref 0–44)
AST: 13 U/L — ABNORMAL LOW (ref 15–41)
Albumin: 2.9 g/dL — ABNORMAL LOW (ref 3.5–5.0)
Alkaline Phosphatase: 58 U/L (ref 38–126)
Anion gap: 14 (ref 5–15)
BUN: 11 mg/dL (ref 6–20)
CO2: 23 mmol/L (ref 22–32)
Calcium: 9.4 mg/dL (ref 8.9–10.3)
Chloride: 102 mmol/L (ref 98–111)
Creatinine, Ser: 0.94 mg/dL (ref 0.61–1.24)
GFR calc Af Amer: >60 mL/min (ref >60)
GFR calc non Af Amer: >60 mL/min (ref >60)
Glucose, Bld: 92 mg/dL (ref 70–99)
Potassium: 3.5 mmol/L (ref 3.5–5.1)
Sodium: 139 mmol/L (ref 135–145)
Total Bilirubin: 0.5 mg/dL (ref 0.3–1.2)
Total Protein: 7 g/dL (ref 6.5–8.1)

## 2019-10-30 LAB — LIPASE, BLOOD: Lipase: 17 U/L (ref 11–51)

## 2019-10-30 MED ORDER — SODIUM CHLORIDE 0.9% FLUSH
3.0000 mL | Freq: Once | INTRAVENOUS | Status: AC
  Administered 2019-10-30: 3 mL via INTRAVENOUS

## 2019-10-30 MED ORDER — FENTANYL CITRATE (PF) 100 MCG/2ML IJ SOLN
25.0000 ug | Freq: Once | INTRAMUSCULAR | Status: AC
  Administered 2019-10-30: 25 ug via INTRAVENOUS
  Filled 2019-10-30: qty 2

## 2019-10-30 MED ORDER — IOHEXOL 300 MG/ML  SOLN
100.0000 mL | Freq: Once | INTRAMUSCULAR | Status: AC | PRN
  Administered 2019-10-30: 100 mL via INTRAVENOUS

## 2019-10-30 MED ORDER — PREDNISONE 10 MG PO TABS: 40.0000 mg | ORAL_TABLET | Freq: Every day | ORAL | 0 refills | Status: AC

## 2019-10-30 MED ORDER — SODIUM CHLORIDE 0.9 % IV BOLUS
1000.0000 mL | Freq: Once | INTRAVENOUS | Status: AC
  Administered 2019-10-30: 1000 mL via INTRAVENOUS

## 2019-10-30 MED ORDER — PREDNISONE 20 MG PO TABS
40.0000 mg | ORAL_TABLET | Freq: Once | ORAL | Status: AC
  Administered 2019-10-30: 40 mg via ORAL
  Filled 2019-10-30: qty 2

## 2019-10-30 NOTE — ED Provider Notes (Signed)
[facility] EMERGENCY DEPARTMENT Provider Note   CSN: [id number] Arrival date & time: 10/30/19  1007     History Chief Complaint  Patient presents with  . Abdominal Pain    Eric Castillo. is a 44 y.o. male with a past medical history of Crohn's disease, prior SBO presenting to the ED with a chief complaint of abdominal pain and constipation.  Reports for the past week has been having middle abdominal pain that is sharp without specific aggravating or alleviating factor.  He has not had a bowel movement in about 7 days.  States this feels similar to his prior Crohn's flareups.  He has a history of a small bowel obstruction in the past as well.  He has not been passing gas.  Denies any nausea, vomiting, fever, urinary symptoms.  Has not tried medications to help with his symptoms.  He does not see a specialist regarding his Crohn's disease.  HPI     Past Medical History:  Diagnosis Date  . Asthma   . Crohn's disease ([place])   . SBO (small bowel obstruction) [facility])     Patient Active Problem List   Diagnosis Date Noted  . Nausea without vomiting   . Exacerbation of Crohn's disease with complication ([place]) 26/83/4196  . Crohn's disease ([place]) 12/26/2018  . Abdominal pain   . Family history of colon cancer in father - 90's 12/21/2018  . Tobacco use   . Crohn's disease of ileum with intestinal obstruction ([place])   . SBO (small bowel obstruction) ([place]) 05/03/2018  . Asthma 05/03/2018    Past Surgical History:  Procedure Laterality Date  . BIOPSY  12/28/2018   Procedure: BIOPSY;  Surgeon: Mauri Pole, MD;  Location: [facility];  Service: Endoscopy;;  . COLONOSCOPY WITH PROPOFOL N/A 12/28/2018   Procedure: COLONOSCOPY WITH PROPOFOL;  Surgeon: Mauri Pole, MD;  Location: [facility];  Service: Endoscopy;  Laterality: N/A;  . MASS EXCISION     UPPER BACK        Family History  Problem Relation Age of Onset  . Crohn's disease Mother   . Heart  attack Mother        In her late 20s.  This was the cause of her death.  . Colon cancer Father        In his mid-to-late 41s.  As of 2020, patient in his early 10s and cancer said to be in remission.  Marland Kitchen Heart disease Father        has pacemaker    Social History   Tobacco Use  . Smoking status: Current Every Day Smoker    Packs/day: 0.50    Years: 21.00    Pack years: 10.50    Types: Cigarettes, Cigars  . Smokeless tobacco: Never Used  Substance Use Topics  . Alcohol use: Not Currently  . Drug use: Yes    Types: Marijuana    Home Medications Prior to Admission medications   Medication Sig Start Date End Date Taking? Authorizing Provider  albuterol (PROVENTIL HFA;VENTOLIN HFA) 108 (90 Base) MCG/ACT inhaler Inhale 1-2 puffs into the lungs every 6 (six) hours as needed for wheezing or shortness of breath.    [provider]  predniSONE (DELTASONE) 10 MG tablet Take 4 tablets (40 mg total) by mouth daily for 10 days. 10/30/19 11/09/19  Delia Heady, PA-C    Allergies    Patient has no known allergies.  Review of Systems   Review of Systems  Constitutional:  Negative for appetite change, chills and fever.  HENT: Negative for ear pain, rhinorrhea, sneezing and sore throat.   Eyes: Negative for photophobia and visual disturbance.  Respiratory: Negative for cough, chest tightness, shortness of breath and wheezing.   Cardiovascular: Negative for chest pain and palpitations.  Gastrointestinal: Positive for abdominal pain and constipation. Negative for blood in stool, diarrhea, nausea and vomiting.  Genitourinary: Negative for dysuria, hematuria and urgency.  Musculoskeletal: Negative for myalgias.  Skin: Negative for rash.  Neurological: Negative for dizziness, weakness and light-headedness.    Physical Exam Updated Vital Signs BP 123/79   Pulse 69   Temp 99 F (37.2 C) (Oral)   Resp 16   Ht 6' (1.829 m)   Wt 59 kg   SpO2 97%   BMI 17.63 kg/m   Physical  Exam Vitals and nursing note reviewed.  Constitutional:      General: He is not in acute distress.    Appearance: He is well-developed.  HENT:     Head: Normocephalic and atraumatic.     Nose: Nose normal.  Eyes:     General: No scleral icterus.       Left eye: No discharge.     Conjunctiva/sclera: Conjunctivae normal.  Cardiovascular:     Rate and Rhythm: Normal rate and regular rhythm.     Heart sounds: Normal heart sounds. No murmur. No friction rub. No gallop.   Pulmonary:     Effort: Pulmonary effort is normal. No respiratory distress.     Breath sounds: Normal breath sounds.  Abdominal:     General: Bowel sounds are normal. There is no distension.     Palpations: Abdomen is soft.     Tenderness: There is abdominal tenderness in the epigastric area, periumbilical area and suprapubic area. There is no guarding.  Musculoskeletal:        General: Normal range of motion.     Cervical back: Normal range of motion and neck supple.  Skin:    General: Skin is warm and dry.     Findings: No rash.  Neurological:     Mental Status: He is alert.     Motor: No abnormal muscle tone.     Coordination: Coordination normal.     ED Results / Procedures / Treatments   Labs (all labs ordered are listed, but only abnormal results are displayed) Labs Reviewed  COMPREHENSIVE METABOLIC PANEL - Abnormal; Notable for the following components:      Result Value   Albumin 2.9 (*)    AST 13 (*)    All other components within normal limits  CBC - Abnormal; Notable for the following components:   Platelets 468 (*)    All other components within normal limits  URINALYSIS, ROUTINE W REFLEX MICROSCOPIC - Abnormal; Notable for the following components:   Color, Urine AMBER (*)    APPearance HAZY (*)    Specific Gravity, Urine 1.033 (*)    Bilirubin Urine SMALL (*)    Ketones, ur 20 (*)    Protein, ur 30 (*)    All other components within normal limits  LIPASE, BLOOD     EKG None  Radiology CT ABDOMEN PELVIS W CONTRAST  Result Date: 10/30/2019 CLINICAL DATA:  Worsening abdominal pain and constipation for 1 week. Crohn disease. Previous history of small bowel obstruction. EXAM: CT ABDOMEN AND PELVIS WITH CONTRAST TECHNIQUE: Multidetector CT imaging of the abdomen and pelvis was performed using the standard protocol following bolus administration of intravenous contrast.  CONTRAST:  110m OMNIPAQUE IOHEXOL 300 MG/ML  SOLN COMPARISON:  04/04/2019 FINDINGS: Lower Chest: No acute findings. Hepatobiliary: Small cyst in the posterior right hepatic lobe is stable. Another tiny low-attenuation lesion in near the junction of the right and left lobes is also stable and may represent a tiny cyst or hemangioma. No new or enlarging liver lesions identified. Gallbladder is unremarkable. No evidence of biliary ductal dilatation. Pancreas:  No mass or inflammatory changes. Spleen: Within normal limits in size and appearance. Adrenals/Urinary Tract: No masses identified. No evidence of ureteral calculi or hydronephrosis. Stomach/Bowel: Moderate wall thickening and mucosal enhancement is again seen involving the distal ileum including the terminal ileum, without significant change in appearance since prior study. Mild dilatation mid small bowel loops is seen, mildly increased since previous study. No evidence abscess or free fluid. No evidence of free intraperitoneal air. Large amount of stool is noted in the ascending and transverse colon. Vascular/Lymphatic: Shotty lymph nodes are seen within the mesentery, largest in the central pelvis measuring 11 mm, without significant change since previous study. These are likely reactive in etiology given evidence of active Crohn disease. No new or increased lymphadenopathy identified. Reproductive:  No mass or other significant abnormality. Other:  None. Musculoskeletal:  No suspicious bone lesions identified. IMPRESSION: No significant change in  moderate enteritis involving the distal ileum including the terminal ileum, consistent with known Crohn disease. No evidence of abscess. Findings consistent with low-grade partial distal small bowel obstruction. Stable shotty mesenteric lymph nodes, most likely reactive in etiology. Electronically Signed   By: JMarlaine HindM.D.   On: 10/30/2019 14:58    Procedures Procedures (including critical care time)  Medications Ordered in ED Medications  sodium chloride flush (NS) 0.9 % injection 3 mL (3 mLs Intravenous Given 10/30/19 1239)  sodium chloride 0.9 % bolus 1,000 mL (0 mLs Intravenous Stopped 10/30/19 1607)  fentaNYL (SUBLIMAZE) injection 25 mcg (25 mcg Intravenous Given 10/30/19 1240)  iohexol (OMNIPAQUE) 300 MG/ML solution 100 mL (100 mLs Intravenous Contrast Given 10/30/19 1425)  fentaNYL (SUBLIMAZE) injection 25 mcg (25 mcg Intravenous Given 10/30/19 1611)  predniSONE (DELTASONE) tablet 40 mg (40 mg Oral Given 10/30/19 1610)    ED Course  I have reviewed the triage vital signs and the nursing notes.  Pertinent labs & imaging results that were available during my care of the patient were reviewed by me and considered in my medical decision making (see chart for details).    MDM Rules/Calculators/A&P                      44year old male with past medical history of Crohn's colitis presenting to the ED with constipation and abdominal pain.  States that this feels similar to his prior Crohn's flareups.  He is not followed by GI.  On exam there is tenderness palpation of the abdomen without rebound or guarding.  Abdomen is soft.  He is denying any vomiting or nausea.  Vital signs are reassuring.  Work-up here significant for urinalysis showing dehydration, normal CBC, CMP and lipase.  CT scan shows findings consistent with Crohn's flareup and possible distal low-grade small bowel obstruction.  I spoke to SNancy Marus of [facility] who reviewed patient's CT findings and prior medical history with Dr.  SFuller Plan  Do not feel that admission is necessary but he needs to be on prednisone.  Recommended that we urged patient to follow-up in the clinic at the scheduled appointment on March 11.  I informed  patient of this and he is comfortable with discharge home.  Pain controlled here.  Asked to place him on 40 mg of prednisone until he is evaluated in the clinic.  We will have him return for worsening symptoms.  Patient is hemodynamically stable, in NAD, and able to ambulate in the ED. Evaluation does not show pathology that would require ongoing emergent intervention or inpatient treatment. I have personally reviewed and interpreted all lab work and imaging at today's ED visit. I explained the diagnosis to the patient. Pain has been managed and has no complaints prior to discharge. Patient is comfortable with above plan and is stable for discharge at this time. All questions were answered prior to disposition. Strict return precautions for returning to the ED were discussed. Encouraged follow up with PCP.   An After Visit Summary was printed and given to the patient.   Portions of this note were generated with Lobbyist. Dictation errors may occur despite best attempts at proofreading.  Final Clinical Impression(s) / ED Diagnoses Final diagnoses:  Crohn's disease of colon with other complication [facility])    Rx / DC Orders ED Discharge Orders         Ordered    predniSONE (DELTASONE) 10 MG tablet  Daily     10/30/19 1738           Delia Heady, PA-C 10/30/19 1805    Sherwood Gambler, MD 10/31/19 [phone]

## 2019-10-30 NOTE — Discharge Instructions (Signed)
It is very important for you to take the prednisone every day. They have arranged an appointment with the GI doctor listed below.  You need to go to this appointment so they can discuss further management of your Crohn's disease. Return to the ED if you start to develop a fever, shortness of breath, vomiting or coughing up blood, blood in your stool.

## 2019-10-30 NOTE — ED Triage Notes (Addendum)
Pt reports abd pain and constipation progressively worse over the last week. Pt states it has been 7 days since a normal BM. Hx of small bowel obstruction

## 2019-10-30 NOTE — ED Notes (Signed)
Pt discharge, prescription, and follow up education provided. Pt verbalizes understanding. Pt is alert and oriented x4 and ambulatory at discharge.

## 2019-11-09 ENCOUNTER — Other Ambulatory Visit: Payer: Self-pay

## 2019-11-09 ENCOUNTER — Ambulatory Visit (INDEPENDENT_AMBULATORY_CARE_PROVIDER_SITE_OTHER): Payer: Self-pay | Admitting: Physician Assistant

## 2019-11-09 ENCOUNTER — Encounter: Payer: Self-pay | Admitting: Physician Assistant

## 2019-11-09 VITALS — BP 114/60 | HR 104 | Temp 97.3°F | Ht 72.0 in | Wt 134.0 lb

## 2019-11-09 DIAGNOSIS — K50019 Crohn's disease of small intestine with unspecified complications: Secondary | ICD-10-CM

## 2019-11-09 MED ORDER — DICYCLOMINE HCL 10 MG PO CAPS: 10.0000 mg | ORAL_CAPSULE | Freq: Three times a day (TID) | ORAL | 2 refills | Status: DC

## 2019-11-09 MED ORDER — PREDNISONE 10 MG PO TABS: ORAL_TABLET | ORAL | 0 refills | Status: DC

## 2019-11-09 NOTE — Patient Instructions (Addendum)
We have sent the following medications to your pharmacy for you to pick up at your convenience: Prednisone, Bentyl   Please follow low fiber/ low residue diet.  Please keep follow-up with Dr Carlean Purl   If you are age 43 or younger, your body mass index should be between 19-25. Your Body mass index is 18.17 kg/m. If this is out of the aformentioned range listed, please consider follow up with your Primary Care Provider.   Thank you for choosing me and [facility].  [name] Esterwood-PA

## 2019-11-09 NOTE — Progress Notes (Signed)
Subjective:    Patient ID: Eric Acres., male    DOB: 1975/10/27, 44 y.o.   MRN: [id number]  HPI  Eric Castillo is a 44 year old African-American male, established with Dr. Carlean Purl, and also known to myself.  He was last here in August 2020.  He comes in today after recent ER visit on 10/30/2019 with exacerbation of Crohn's with abdominal pain. He was started on a course of prednisone 40 mg daily x10 days per the ER. He had CT of the abdomen and pelvis on 10/30/2019 that showed moderate enteritis involving the distal ileum and terminal ileum with low-grade partial bowel obstruction, no significant change from August 2020. Patient has known history of Crohn's ileitis which has been poorly controlled due to lack of insurance and intermittent noncompliance. He did undergo work-up in April 2020 with colonoscopy with finding of moderate friability and ulceration as well as pseudopolyps in the terminal ileum, was noted to have mild erythema in the right and left colon which was biopsied.  Colon biopsies were benign and biopsies from the TI showed active ileitis. Patient had actually been hospitalized last spring and received one dose of Remicade while hospitalized He was lost to follow-up for a few months thereafter.  We last treated him in August 2020 with a long tapered course of prednisone.  He says after that he did okay for couple of months and then symptoms gradually returned and now has had increase in abdominal pain steadily over the past couple of months.  Pain is increased postprandially.  He says he is gone on liquids for several days at a time on different occasions with flareups of pain.  His weight is down about 5 pounds since he was last seen.  He has had occasional episodes of vomiting but nothing on a regular basis.  He is currently having a bowel movement every 2 to 3 days, generally feels better after a bowel movement and has not been seeing any blood. He states that he has just recently obtained  insurance coverage which he is paying for individually.  He says this is a [employer] and should go into effect in 3 to 4 weeks.  He knows he needs further treatment for his Crohn's and wants to be able to obtain that.    Review of Systems Pertinent positive and negative review of systems were noted in the above HPI section.  All other review of systems was otherwise negative.  Outpatient Encounter Medications as of 11/09/2019  Medication Sig  . albuterol (PROVENTIL HFA;VENTOLIN HFA) 108 (90 Base) MCG/ACT inhaler Inhale 1-2 puffs into the lungs every 6 (six) hours as needed for wheezing or shortness of breath.  . predniSONE (DELTASONE) 10 MG tablet Take 4 tablets (40 mg total) by mouth daily for 10 days.  Marland Kitchen dicyclomine (BENTYL) 10 MG capsule Take 1 capsule (10 mg total) by mouth 4 (four) times daily -  before meals and at bedtime.  . predniSONE (DELTASONE) 10 MG tablet Take 4 tablets (40 mg total) by mouth daily for 14 days, THEN 3 tablets (30 mg total) daily for 14 days, THEN 2 tablets (20 mg total) daily for 14 days, THEN 1 tablet (10 mg total) daily for 14 days.   No facility-administered encounter medications on file as of 11/09/2019.   No Known Allergies Patient Active Problem List   Diagnosis Date Noted  . Nausea without vomiting   . Exacerbation of Crohn's disease with complication ([place]) 09/08/3233  . Crohn's  disease ([place]) 12/26/2018  . Abdominal pain   . Family history of colon cancer in father - 66's 12/21/2018  . Tobacco use   . Crohn's disease of ileum with intestinal obstruction ([place])   . SBO (small bowel obstruction) ([place]) 05/03/2018  . Asthma 05/03/2018   Social History   Socioeconomic History  . Marital status: Single    Spouse name: Not on file  . Number of children: 0  . Years of education: Not on file  . Highest education level: Not on file  Occupational History    Employer: [employer]  Tobacco Use  . Smoking status: Current Every Day Smoker     Packs/day: 0.50    Years: 21.00    Pack years: 10.50    Types: Cigarettes, Cigars  . Smokeless tobacco: Never Used  Substance and Sexual Activity  . Alcohol use: Not Currently  . Drug use: Yes    Types: Marijuana  . Sexual activity: Not on file  Other Topics Concern  . Not on file  Social History Narrative   Engaged. Lives with fiance and father in [place].   HS grad + 1 year college   + ETOH, marijuana and smoker   Starting new job as Therapist, music    Social Determinants of Radio broadcast assistant Strain:   . Difficulty of Paying Living Expenses:   Food Insecurity:   . Worried About Charity fundraiser in the Last Year:   . Arboriculturist in the Last Year:   Transportation Needs:   . Film/video editor (Medical):   Marland Kitchen Lack of Transportation (Non-Medical):   Physical Activity:   . Days of Exercise per Week:   . Minutes of Exercise per Session:   Stress:   . Feeling of Stress :   Social Connections:   . Frequency of Communication with Friends and Family:   . Frequency of Social Gatherings with Friends and Family:   . Attends Religious Services:   . Active Member of Clubs or Organizations:   . Attends Archivist Meetings:   Marland Kitchen Marital Status:   Intimate Partner Violence:   . Fear of Current or Ex-Partner:   . Emotionally Abused:   Marland Kitchen Physically Abused:   . Sexually Abused:     Mr. Eric Castillo family history includes Colon cancer in his father; Crohn's disease in his mother; Heart attack in his mother; Heart disease in his father.      Objective:    Vitals:   11/09/19 1334  BP: 114/60  Pulse: (!) 104  Temp: (!) 97.3 F (36.3 C)    Physical Exam Well-developed  Thin young AA malee in no acute distress.  Height, [id number] , BMI 18  HEENT; nontraumatic normocephalic, EOMI, PER R LA, sclera anicteric. Oropharynx;not examined Neck; supple, no JVD Cardiovascular; regular rate and rhythm with S1-S2, no murmur rub or gallop  Pulmonary; Clear bilaterally Abdomen; soft, epigastrium and mid abdomen , nondistended, no palpable mass or hepatosplenomegaly, bowel sounds are active Rectal;not done Skin; benign exam, no jaundice rash or appreciable lesions Extremities; no clubbing cyanosis or edema skin warm and dry Neuro/Psych; alert and oriented x4, grossly nonfocal mood and affect appropriate       Assessment & Plan:   #39 44 year old African-American male with Crohn's ileitis, hospitalization April 2020 with partial small bowel obstruction.  He was last seen here in August 2020 and treated with a long course of tapered steroids.  Symptoms have gradually progressed again  now over the past few months to the point where he is having constant daily pain increased postprandially. Recent CT showed persistent active ileitis with low-grade partial obstruction.  He has had some improvement over the past 10 days on prednisone.  #2 family history of colon cancer-up-to-date with colonoscopy last done April 2020 #3 hypoalbuminemia-secondary to above  Plan; continue prednisone 40 mg p.o. daily x2 more weeks then decrease to 30 mg p.o. daily x2 weeks then decrease to 20 mg daily x2 weeks then decrease to 10 mg daily x2 weeks. Add Bentyl 10 mg p.o. twice daily to 3 times daily for abdominal pain/cramping. Low residue diet Will schedule follow-up with Dr. Carlean Purl or myself in 4 to 5 weeks, hopefully his insurance will have activated at that point and we can work towards initiation of Remicade or Humira. We will plan to check labs, including QuantiFERON gold etc. at next office visit.  [name] Genia Harold PA-C 11/09/2019   Cc: Wilber Oliphant, MD

## 2019-11-20 ENCOUNTER — Other Ambulatory Visit: Payer: Self-pay

## 2019-11-20 ENCOUNTER — Emergency Department (HOSPITAL_COMMUNITY)
Admission: EM | Admit: 2019-11-20 | Discharge: 2019-11-20 | Disposition: A | Discharge status: Home / Self Care | Payer: Self-pay | Attending: Emergency Medicine | Admitting: Emergency Medicine

## 2019-11-20 ENCOUNTER — Encounter (HOSPITAL_COMMUNITY): Payer: Self-pay | Admitting: Emergency Medicine

## 2019-11-20 DIAGNOSIS — F17210 Nicotine dependence, cigarettes, uncomplicated: Secondary | ICD-10-CM | POA: Insufficient documentation

## 2019-11-20 DIAGNOSIS — E876 Hypokalemia: Secondary | ICD-10-CM | POA: Insufficient documentation

## 2019-11-20 DIAGNOSIS — R252 Cramp and spasm: Secondary | ICD-10-CM | POA: Insufficient documentation

## 2019-11-20 DIAGNOSIS — J45909 Unspecified asthma, uncomplicated: Secondary | ICD-10-CM | POA: Insufficient documentation

## 2019-11-20 LAB — CBC WITH DIFFERENTIAL/PLATELET
Abs Immature Granulocytes: 0.13 10*3/uL — ABNORMAL HIGH (ref 0.00–0.07)
Basophils Absolute: 0 10*3/uL (ref 0.0–0.1)
Basophils Relative: 0 %
Eosinophils Absolute: 0.3 10*3/uL (ref 0.0–0.5)
Eosinophils Relative: 2 %
HCT: 35.1 % — ABNORMAL LOW (ref 39.0–52.0)
Hemoglobin: 11 g/dL — ABNORMAL LOW (ref 13.0–17.0)
Immature Granulocytes: 1 %
Lymphocytes Relative: 9 %
Lymphs Abs: 1.4 10*3/uL (ref 0.7–4.0)
MCH: 27.8 pg (ref 26.0–34.0)
MCHC: 31.3 g/dL (ref 30.0–36.0)
MCV: 88.9 fL (ref 80.0–100.0)
Monocytes Absolute: 1.5 10*3/uL — ABNORMAL HIGH (ref 0.1–1.0)
Monocytes Relative: 10 %
Neutro Abs: 11.9 10*3/uL — ABNORMAL HIGH (ref 1.7–7.7)
Neutrophils Relative %: 78 %
Platelets: 337 10*3/uL (ref 150–400)
RBC: 3.95 MIL/uL — ABNORMAL LOW (ref 4.22–5.81)
RDW: 19.3 % — ABNORMAL HIGH (ref 11.5–15.5)
WBC: 15.2 10*3/uL — ABNORMAL HIGH (ref 4.0–10.5)
nRBC: 0 % (ref 0.0–0.2)

## 2019-11-20 LAB — BASIC METABOLIC PANEL
Anion gap: 11 (ref 5–15)
BUN: 16 mg/dL (ref 6–20)
CO2: 26 mmol/L (ref 22–32)
Calcium: 8.8 mg/dL — ABNORMAL LOW (ref 8.9–10.3)
Chloride: 104 mmol/L (ref 98–111)
Creatinine, Ser: 0.83 mg/dL (ref 0.61–1.24)
GFR calc Af Amer: >60 mL/min (ref >60)
GFR calc non Af Amer: >60 mL/min (ref >60)
Glucose, Bld: 98 mg/dL (ref 70–99)
Potassium: 3.4 mmol/L — ABNORMAL LOW (ref 3.5–5.1)
Sodium: 141 mmol/L (ref 135–145)

## 2019-11-20 LAB — URINALYSIS, ROUTINE W REFLEX MICROSCOPIC
Bacteria, UA: NONE SEEN
Glucose, UA: NEGATIVE mg/dL
Hgb urine dipstick: NEGATIVE
Ketones, ur: 5 mg/dL — AB
Leukocytes,Ua: NEGATIVE
Nitrite: NEGATIVE
Protein, ur: 30 mg/dL — AB
Specific Gravity, Urine: 1.043 — ABNORMAL HIGH (ref 1.005–1.030)
pH: 5 (ref 5.0–8.0)

## 2019-11-20 MED ORDER — POTASSIUM CHLORIDE CRYS ER 20 MEQ PO TBCR: 20.0000 meq | EXTENDED_RELEASE_TABLET | Freq: Every day | ORAL | 0 refills | Status: DC

## 2019-11-20 MED ORDER — GABAPENTIN 100 MG PO CAPS
100.0000 mg | ORAL_CAPSULE | ORAL | Status: AC
  Administered 2019-11-20: 100 mg via ORAL
  Filled 2019-11-20: qty 1

## 2019-11-20 MED ORDER — POTASSIUM CHLORIDE CRYS ER 20 MEQ PO TBCR
40.0000 meq | EXTENDED_RELEASE_TABLET | Freq: Once | ORAL | Status: AC
  Administered 2019-11-20: 40 meq via ORAL
  Filled 2019-11-20: qty 2

## 2019-11-20 MED ORDER — GABAPENTIN 100 MG PO CAPS: 100.0000 mg | ORAL_CAPSULE | Freq: Two times a day (BID) | ORAL | 0 refills | Status: DC

## 2019-11-20 NOTE — ED Notes (Signed)
Discharge instructions reviewed with pt. Pt verbalized understanding.   

## 2019-11-20 NOTE — ED Provider Notes (Signed)
[facility]ERGENCY DEPARTMENT Provider Note   CSN: [id number] Arrival date & time: 11/20/19  0350     History Chief Complaint  Patient presents with  . Lower Legs Cramps    Isaid Salvia. is a 44 y.o. male.  Patient presents to the emergency department with complaints of leg cramps.  Patient reports that he has never had similar symptoms before.  He was walking to the store tonight and he started feeling pins-and-needles in both of his lower legs.  Symptoms were from the knees down.  He did not have any associated weakness of lower extremities.  He denies injury.  No lower back pain.  Patient took 2 Advil earlier to help with the pain but has not gone completely away.        Past Medical History:  Diagnosis Date  . Asthma   . Crohn's disease ([place])   . SBO (small bowel obstruction) [facility])     Patient Active Problem List   Diagnosis Date Noted  . Nausea without vomiting   . Exacerbation of Crohn's disease with complication ([place]) 86/76/7209  . Crohn's disease ([place]) 12/26/2018  . Abdominal pain   . Family history of colon cancer in father - 91's 12/21/2018  . Tobacco use   . Crohn's disease of ileum with intestinal obstruction ([place])   . SBO (small bowel obstruction) ([place]) 05/03/2018  . Asthma 05/03/2018    Past Surgical History:  Procedure Laterality Date  . BIOPSY  12/28/2018   Procedure: BIOPSY;  Surgeon: Mauri Pole, MD;  Location: [facility];  Service: Endoscopy;;  . COLONOSCOPY WITH PROPOFOL N/A 12/28/2018   Procedure: COLONOSCOPY WITH PROPOFOL;  Surgeon: Mauri Pole, MD;  Location: [facility];  Service: Endoscopy;  Laterality: N/A;  . MASS EXCISION     UPPER BACK        Family History  Problem Relation Age of Onset  . Crohn's disease Mother   . Heart attack Mother        In her late 17s.  This was the cause of her death.  . Colon cancer Father        In his mid-to-late 67s.  As of 2020, patient in his early 7s and  cancer said to be in remission.  Marland Kitchen Heart disease Father        has pacemaker    Social History   Tobacco Use  . Smoking status: Current Every Day Smoker    Packs/day: 0.50    Years: 21.00    Pack years: 10.50    Types: Cigarettes, Cigars  . Smokeless tobacco: Never Used  Substance Use Topics  . Alcohol use: Yes  . Drug use: Yes    Types: Marijuana    Home Medications Prior to Admission medications   Medication Sig Start Date End Date Taking? Authorizing Provider  albuterol (PROVENTIL HFA;VENTOLIN HFA) 108 (90 Base) MCG/ACT inhaler Inhale 1-2 puffs into the lungs every 6 (six) hours as needed for wheezing or shortness of breath.    [provider]  dicyclomine (BENTYL) 10 MG capsule Take 1 capsule (10 mg total) by mouth 4 (four) times daily -  before meals and at bedtime. 11/09/19   Esterwood, [name] S, PA-C  gabapentin (NEURONTIN) 100 MG capsule Take 1 capsule (100 mg total) by mouth 2 (two) times daily. 11/20/19   Orpah Greek, MD  potassium chloride SA (KLOR-CON) 20 MEQ tablet Take 1 tablet (20 mEq total) by mouth daily. 11/20/19  Orpah Greek, MD  predniSONE (DELTASONE) 10 MG tablet Take 4 tablets (40 mg total) by mouth daily for 14 days, THEN 3 tablets (30 mg total) daily for 14 days, THEN 2 tablets (20 mg total) daily for 14 days, THEN 1 tablet (10 mg total) daily for 14 days. 11/09/19 01/04/20  Esterwood, [name] S, PA-C    Allergies    Patient has no known allergies.  Review of Systems   Review of Systems  Musculoskeletal: Positive for myalgias.  All other systems reviewed and are negative.   Physical Exam Updated Vital Signs BP 132/88 (BP Location: Right Arm)   Pulse 91   Temp 98.1 F (36.7 C) (Oral)   Resp 17   Ht 6' (1.829 m)   Wt 61.2 kg   SpO2 97%   BMI 18.31 kg/m   Physical Exam Vitals and nursing note reviewed.  Constitutional:      General: He is not in acute distress.    Appearance: Normal appearance. He is well-developed.    HENT:     Head: Normocephalic and atraumatic.     Right Ear: Hearing normal.     Left Ear: Hearing normal.     Nose: Nose normal.  Eyes:     Conjunctiva/sclera: Conjunctivae normal.     Pupils: Pupils are equal, round, and reactive to light.  Cardiovascular:     Rate and Rhythm: Regular rhythm.     Pulses:          Dorsalis pedis pulses are 3+ on the right side and 3+ on the left side.     Heart sounds: S1 normal and S2 normal. No murmur. No friction rub. No gallop.   Pulmonary:     Effort: Pulmonary effort is normal. No respiratory distress.     Breath sounds: Normal breath sounds.  Chest:     Chest wall: No tenderness.  Abdominal:     General: Bowel sounds are normal.     Palpations: Abdomen is soft.     Tenderness: There is no abdominal tenderness. There is no guarding or rebound. Negative signs include Murphy's sign and McBurney's sign.     Hernia: No hernia is present.  Musculoskeletal:        General: Normal range of motion.     Cervical back: Normal range of motion and neck supple.  Skin:    General: Skin is warm and dry.     Findings: No rash.  Neurological:     Mental Status: He is alert and oriented to person, place, and time.     GCS: GCS eye subscore is 4. GCS verbal subscore is 5. GCS motor subscore is 6.     Cranial Nerves: No cranial nerve deficit.     Sensory: No sensory deficit.     Coordination: Coordination normal.  Psychiatric:        Speech: Speech normal.        Behavior: Behavior normal.        Thought Content: Thought content normal.     ED Results / Procedures / Treatments   Labs (all labs ordered are listed, but only abnormal results are displayed) Labs Reviewed  CBC WITH DIFFERENTIAL/PLATELET - Abnormal; Notable for the following components:      Result Value   WBC 15.2 (*)    RBC 3.95 (*)    Hemoglobin 11.0 (*)    HCT 35.1 (*)    RDW 19.3 (*)    Neutro Abs 11.9 (*)  Monocytes Absolute 1.5 (*)    Abs Immature Granulocytes 0.13 (*)     All other components within normal limits  BASIC METABOLIC PANEL - Abnormal; Notable for the following components:   Potassium 3.4 (*)    Calcium 8.8 (*)    All other components within normal limits  URINALYSIS, ROUTINE W REFLEX MICROSCOPIC - Abnormal; Notable for the following components:   APPearance HAZY (*)    Specific Gravity, Urine 1.043 (*)    Bilirubin Urine SMALL (*)    Ketones, ur 5 (*)    Protein, ur 30 (*)    All other components within normal limits    EKG None  Radiology No results found.  Procedures Procedures (including critical care time)  Medications Ordered in ED Medications  gabapentin (NEURONTIN) capsule 100 mg (has no administration in time range)  potassium chloride SA (KLOR-CON) CR tablet 40 mEq (has no administration in time range)    ED Course  I have reviewed the triage vital signs and the nursing notes.  Pertinent labs & imaging results that were available during my care of the patient were reviewed by me and considered in my medical decision making (see chart for details).    MDM Rules/Calculators/A&P                      Patient presents with bilateral lower extremity cramping and "pins-and-needles" sensation in the legs.  Symptoms started earlier tonight.  No history of diabetes or peripheral neuropathy.  Patient denies injury.  He has not noticed any swelling of the lower extremities.  Examination reveals soft calves bilaterally without swelling or tenderness.  No venous cords.  Negative Homan.  Patient has bounding dorsalis pedal pulses bilaterally.  No evidence of peripheral arterial disease.  Skin is normal other than dry skin of bilateral shins, left greater than right.  No erythema, warmth or signs of infection.  No joint involvement.  Lab work reveals mild hypokalemia, otherwise normal. Treat hypokalemia, otherwise symptomatic treatment.  Final Clinical Impression(s) / ED Diagnoses Final diagnoses:  Muscle cramps  Hypokalemia     Rx / DC Orders ED Discharge Orders         Ordered    gabapentin (NEURONTIN) 100 MG capsule  2 times daily     11/20/19 0607    potassium chloride SA (KLOR-CON) 20 MEQ tablet  Daily     11/20/19 0607           Orpah Greek, MD 11/20/19 [phone]

## 2019-11-20 NOTE — ED Triage Notes (Signed)
Patient arrived with EMS from home reports bilateral lower legs muscle cramps onset last night , denies injury , ambulatory , denies fever or chills .

## 2019-11-27 ENCOUNTER — Inpatient Hospital Stay (HOSPITAL_COMMUNITY)
Admission: EM | Admit: 2019-11-27 | Discharge: 2019-12-11 | DRG: 329 | Disposition: A | Discharge status: Home / Self Care | Payer: Self-pay | Attending: Family Medicine | Admitting: Family Medicine

## 2019-11-27 ENCOUNTER — Other Ambulatory Visit: Payer: Self-pay

## 2019-11-27 ENCOUNTER — Emergency Department (HOSPITAL_COMMUNITY): Payer: Self-pay

## 2019-11-27 ENCOUNTER — Encounter (HOSPITAL_COMMUNITY): Payer: Self-pay | Admitting: *Deleted

## 2019-11-27 DIAGNOSIS — Z4659 Encounter for fitting and adjustment of other gastrointestinal appliance and device: Secondary | ICD-10-CM

## 2019-11-27 DIAGNOSIS — K50913 Crohn's disease, unspecified, with fistula: Secondary | ICD-10-CM

## 2019-11-27 DIAGNOSIS — K56609 Unspecified intestinal obstruction, unspecified as to partial versus complete obstruction: Secondary | ICD-10-CM | POA: Diagnosis present

## 2019-11-27 DIAGNOSIS — K567 Ileus, unspecified: Secondary | ICD-10-CM | POA: Diagnosis not present

## 2019-11-27 DIAGNOSIS — K9189 Other postprocedural complications and disorders of digestive system: Secondary | ICD-10-CM

## 2019-11-27 DIAGNOSIS — Z20822 Contact with and (suspected) exposure to covid-19: Secondary | ICD-10-CM | POA: Diagnosis present

## 2019-11-27 DIAGNOSIS — R1084 Generalized abdominal pain: Secondary | ICD-10-CM

## 2019-11-27 DIAGNOSIS — R636 Underweight: Secondary | ICD-10-CM | POA: Diagnosis present

## 2019-11-27 DIAGNOSIS — J45909 Unspecified asthma, uncomplicated: Secondary | ICD-10-CM | POA: Diagnosis present

## 2019-11-27 DIAGNOSIS — Z800 Family history of malignant neoplasm of digestive organs: Secondary | ICD-10-CM

## 2019-11-27 DIAGNOSIS — K50012 Crohn's disease of small intestine with intestinal obstruction: Principal | ICD-10-CM | POA: Diagnosis present

## 2019-11-27 DIAGNOSIS — K50013 Crohn's disease of small intestine with fistula: Secondary | ICD-10-CM

## 2019-11-27 DIAGNOSIS — F17210 Nicotine dependence, cigarettes, uncomplicated: Secondary | ICD-10-CM | POA: Diagnosis present

## 2019-11-27 DIAGNOSIS — F1290 Cannabis use, unspecified, uncomplicated: Secondary | ICD-10-CM | POA: Diagnosis present

## 2019-11-27 DIAGNOSIS — K632 Fistula of intestine: Secondary | ICD-10-CM | POA: Diagnosis present

## 2019-11-27 DIAGNOSIS — Z789 Other specified health status: Secondary | ICD-10-CM

## 2019-11-27 DIAGNOSIS — Z681 Body mass index (BMI) 19 or less, adult: Secondary | ICD-10-CM

## 2019-11-27 DIAGNOSIS — Z560 Unemployment, unspecified: Secondary | ICD-10-CM

## 2019-11-27 DIAGNOSIS — Z9114 Patient's other noncompliance with medication regimen: Secondary | ICD-10-CM

## 2019-11-27 DIAGNOSIS — Z8249 Family history of ischemic heart disease and other diseases of the circulatory system: Secondary | ICD-10-CM

## 2019-11-27 DIAGNOSIS — K651 Peritoneal abscess: Secondary | ICD-10-CM | POA: Diagnosis present

## 2019-11-27 DIAGNOSIS — R110 Nausea: Secondary | ICD-10-CM

## 2019-11-27 DIAGNOSIS — R748 Abnormal levels of other serum enzymes: Secondary | ICD-10-CM | POA: Diagnosis present

## 2019-11-27 DIAGNOSIS — T8143XA Infection following a procedure, organ and space surgical site, initial encounter: Secondary | ICD-10-CM

## 2019-11-27 DIAGNOSIS — D62 Acute posthemorrhagic anemia: Secondary | ICD-10-CM | POA: Diagnosis not present

## 2019-11-27 LAB — SARS CORONAVIRUS 2 (TAT 6-24 HRS): SARS Coronavirus 2: NEGATIVE

## 2019-11-27 LAB — URINALYSIS, ROUTINE W REFLEX MICROSCOPIC
Bilirubin Urine: NEGATIVE
Glucose, UA: NEGATIVE mg/dL
Hgb urine dipstick: NEGATIVE
Ketones, ur: NEGATIVE mg/dL
Leukocytes,Ua: NEGATIVE
Nitrite: NEGATIVE
Protein, ur: NEGATIVE mg/dL
Specific Gravity, Urine: >1.046 — ABNORMAL HIGH (ref 1.005–1.030)
pH: 6 (ref 5.0–8.0)

## 2019-11-27 LAB — CBC
HCT: 42.9 % (ref 39.0–52.0)
Hemoglobin: 13.8 g/dL (ref 13.0–17.0)
MCH: 28.3 pg (ref 26.0–34.0)
MCHC: 32.2 g/dL (ref 30.0–36.0)
MCV: 88.1 fL (ref 80.0–100.0)
Platelets: 370 10*3/uL (ref 150–400)
RBC: 4.87 MIL/uL (ref 4.22–5.81)
RDW: 19.8 % — ABNORMAL HIGH (ref 11.5–15.5)
WBC: 13.3 10*3/uL — ABNORMAL HIGH (ref 4.0–10.5)
nRBC: 0 % (ref 0.0–0.2)

## 2019-11-27 LAB — COMPREHENSIVE METABOLIC PANEL
ALT: 22 U/L (ref 0–44)
AST: 17 U/L (ref 15–41)
Albumin: 3.4 g/dL — ABNORMAL LOW (ref 3.5–5.0)
Alkaline Phosphatase: 60 U/L (ref 38–126)
Anion gap: 14 (ref 5–15)
BUN: 16 mg/dL (ref 6–20)
CO2: 29 mmol/L (ref 22–32)
Calcium: 9.4 mg/dL (ref 8.9–10.3)
Chloride: 97 mmol/L — ABNORMAL LOW (ref 98–111)
Creatinine, Ser: 0.91 mg/dL (ref 0.61–1.24)
GFR calc Af Amer: >60 mL/min (ref >60)
GFR calc non Af Amer: >60 mL/min (ref >60)
Glucose, Bld: 99 mg/dL (ref 70–99)
Potassium: 3.5 mmol/L (ref 3.5–5.1)
Sodium: 140 mmol/L (ref 135–145)
Total Bilirubin: 0.8 mg/dL (ref 0.3–1.2)
Total Protein: 6.8 g/dL (ref 6.5–8.1)

## 2019-11-27 LAB — LIPASE, BLOOD: Lipase: 25 U/L (ref 11–51)

## 2019-11-27 MED ORDER — MORPHINE SULFATE (PF) 2 MG/ML IV SOLN
2.0000 mg | INTRAVENOUS | Status: DC | PRN
  Administered 2019-11-27 – 2019-11-28 (×6): 2 mg via INTRAVENOUS
  Filled 2019-11-27 (×6): qty 1

## 2019-11-27 MED ORDER — METHYLPREDNISOLONE SODIUM SUCC 40 MG IJ SOLR
40.0000 mg | INTRAMUSCULAR | Status: DC
  Administered 2019-11-27 – 2019-11-28 (×2): 40 mg via INTRAVENOUS
  Filled 2019-11-27 (×2): qty 1

## 2019-11-27 MED ORDER — ONDANSETRON HCL 4 MG/2ML IJ SOLN: 4.0000 mg | Freq: Four times a day (QID) | INTRAMUSCULAR | Status: DC | PRN

## 2019-11-27 MED ORDER — HYDROMORPHONE HCL 1 MG/ML IJ SOLN
1.0000 mg | Freq: Once | INTRAMUSCULAR | Status: AC
  Administered 2019-11-27: 1 mg via INTRAVENOUS
  Filled 2019-11-27: qty 1

## 2019-11-27 MED ORDER — SODIUM CHLORIDE 0.9 % IV SOLN: INTRAVENOUS | Status: AC

## 2019-11-27 MED ORDER — SODIUM CHLORIDE 0.9 % IV BOLUS (SEPSIS)
1000.0000 mL | Freq: Once | INTRAVENOUS | Status: AC
  Administered 2019-11-27: 1000 mL via INTRAVENOUS

## 2019-11-27 MED ORDER — ONDANSETRON HCL 4 MG PO TABS: 4.0000 mg | ORAL_TABLET | Freq: Four times a day (QID) | ORAL | Status: DC | PRN

## 2019-11-27 MED ORDER — IOHEXOL 300 MG/ML  SOLN
100.0000 mL | Freq: Once | INTRAMUSCULAR | Status: AC | PRN
  Administered 2019-11-27: 75 mL via INTRAVENOUS

## 2019-11-27 MED ORDER — ONDANSETRON HCL 4 MG/2ML IJ SOLN
4.0000 mg | Freq: Once | INTRAMUSCULAR | Status: AC
  Administered 2019-11-27: 4 mg via INTRAVENOUS
  Filled 2019-11-27: qty 2

## 2019-11-27 MED ORDER — HEPARIN SODIUM (PORCINE) 5000 UNIT/ML IJ SOLN
5000.0000 [IU] | Freq: Three times a day (TID) | INTRAMUSCULAR | Status: DC
  Administered 2019-11-27 – 2019-11-28 (×4): 5000 [IU] via SUBCUTANEOUS
  Filled 2019-11-27 (×5): qty 1

## 2019-11-27 MED ORDER — SODIUM CHLORIDE 0.9% FLUSH
3.0000 mL | Freq: Once | INTRAVENOUS | Status: AC
  Administered 2019-11-27: 3 mL via INTRAVENOUS

## 2019-11-27 MED ORDER — CHEWING GUM (ORBIT) SUGAR FREE
1.0000 | CHEWING_GUM | Freq: Four times a day (QID) | ORAL | Status: DC
  Administered 2019-11-27 – 2019-12-11 (×51): 1 via ORAL
  Filled 2019-11-27 (×5): qty 1

## 2019-11-27 NOTE — Progress Notes (Signed)
Patient arrived to 6N21 from ED via wheelchair, patient is ambulatory with assist, VSS, c/o pain 8/10, prn med given. Oriented to room, call light and use of bed controls, informed about visitation policy, will continue to monitor patient.

## 2019-11-27 NOTE — Progress Notes (Addendum)
FPTS Interim Progress Note  S: Patient seen and examined at bedside for PM check.  Patient upset and yelling on phone when entered.  Hung up from phone and stated that he needed his IV removed because he was leaving.  He stated that he needed to leave because he is being evicted and no one will get his things.  He states that he understands that he needs to stay in the hospital to be treated and that "I could die."  He is alert and oriented x4.  Offered to call family or social work for him and he declined.  He asked that this provider ask the nurse to come remove his IV and bring him the [facility] paperwork.  His nurse Charleston Ropes was advised of this.  Patient able to understand that he requires continued medical care, and that there are adverse consequences to leaving now, including possibly death.  Given this, patient can leave AMA if he so chooses.    O: BP (!) 145/84 (BP Location: Right Arm)   Pulse 91   Temp 99 F (37.2 C) (Oral)   Resp 20   Wt 61.2 kg   SpO2 100%   BMI 18.30 kg/m     A/P: Patient may be leaving AMA, has proven that he has capacity to do so If patient stays, plan to consult surgery  Update: Patient decided not to leave, will consult Surgery  Update: Dr. Brantley Stage consulted and will see patient  Eric Dunker, DO 11/27/2019, 4:44 PM PGY-2, [facility] pager [phone]

## 2019-11-27 NOTE — Consult Note (Addendum)
Consultation  Referring Provider: Dr. Owens Shark   Primary Care Physician:  Wilber Oliphant, MD Primary Gastroenterologist: Dr. Carlean Purl       Reason for Consultation: Crohn's with small bowel obstruction            HPI:   Eric Castillo. is a 44 y.o. AA male with a past medical history as listed below including Crohn's disease, who presented to the ER with abdominal pain and nausea and vomiting.    11/09/2019 patient seen in our outpatient clinic.  That time was noted that he came in after recent ER visit on 10/30/2019 with exacerbation of Crohn's with abdominal pain.  He had been started on Prednisone 40 mg daily x10 days by the ER and had a CT of the abdomen pelvis which showed moderate enteritis involving the distal ileum and terminal ileum with low-grade partial bowel obstruction, no significant change from August 2020.  Patient has a known history of Crohn's ileitis which has been poorly controlled due to lack of insurance and intermittent noncompliance.  Underwent work-up in April 2020 with colonoscopy and findings of moderate friability and ulceration as well as pseudopolyps in the terminal ileum, noted to have mild erythema in the right and left colon which was biopsied, colon biopsies were benign and biopsies from the TI showed active ileitis.  Previously hospitalized in the spring 2020 and received 1 dose of Remicade, lost to follow-up for a few months thereafter.  He was last treated by our clinic in August 2020 with a long taper course of prednisone.  He then returned to the ER in March.  At that time a follow-up he was doing better on the prednisone with a bowel movement every 2 to 3 days.  Apparently had just gotten insurance.    Today, the patient tells me that he is still on Prednisone, in fact he just decreased down to 30 mg on 11/25/2019 and the very next day started with severe abdominal pain and had an episode of nausea and vomiting yesterday evening.  Explains that he has not had a bowel  movement or passed gas in 2 days.  He is groaning in pain at time of my exam.  Tells me he is a 10/10 pain that nothing has helped so far, this pain is "all over".    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  Apparently someone in the ER told him he could qualify for Medicaid.    Denies fever, chills or blood in his stool.  Past Medical History:  Diagnosis Date  . Asthma   . Crohn's disease ([place])   . SBO (small bowel obstruction) ([facility])     Past Surgical History:  Procedure Laterality Date  . BIOPSY  12/28/2018   Procedure: BIOPSY;  Surgeon: Mauri Pole, MD;  Location: [facility];  Service: Endoscopy;;  . COLONOSCOPY WITH PROPOFOL N/A 12/28/2018   Procedure: COLONOSCOPY WITH PROPOFOL;  Surgeon: Mauri Pole, MD;  Location: [facility];  Service: Endoscopy;  Laterality: N/A;  . MASS EXCISION     UPPER BACK     Family History  Problem Relation Age of Onset  . Crohn's disease Mother   . Heart attack Mother        In her late 71s.  This was the cause of her death.  . Colon cancer Father        In his mid-to-late 69s.  As of 2020, patient  in his early 18s and cancer said to be in remission.  Marland Kitchen Heart disease Father        has pacemaker   Social History   Tobacco Use  . Smoking status: Current Every Day Smoker    Packs/day: 0.50    Years: 21.00    Pack years: 10.50    Types: Cigarettes, Cigars  . Smokeless tobacco: Never Used  Substance Use Topics  . Alcohol use: Yes  . Drug use: Yes    Types: Marijuana    Prior to Admission medications   Medication Sig Start Date End Date Taking? Authorizing Provider  albuterol (PROVENTIL HFA;VENTOLIN HFA) 108 (90 Base) MCG/ACT inhaler Inhale 1-2 puffs into the lungs every 6 (six) hours as needed for wheezing or shortness of breath.   Yes [provider]  gabapentin (NEURONTIN) 100 MG capsule Take 1 capsule (100 mg total) by mouth 2 (two) times daily. 11/20/19  Yes Pollina,  Gwenyth Allegra, MD  potassium chloride SA (KLOR-CON) 20 MEQ tablet Take 1 tablet (20 mEq total) by mouth daily. 11/20/19  Yes Pollina, Gwenyth Allegra, MD  predniSONE (DELTASONE) 10 MG tablet Take 4 tablets (40 mg total) by mouth daily for 14 days, THEN 3 tablets (30 mg total) daily for 14 days, THEN 2 tablets (20 mg total) daily for 14 days, THEN 1 tablet (10 mg total) daily for 14 days. 11/09/19 01/04/20 Yes Esterwood, [name] S, PA-C  dicyclomine (BENTYL) 10 MG capsule Take 1 capsule (10 mg total) by mouth 4 (four) times daily -  before meals and at bedtime. Patient not taking: Reported on 11/27/2019 11/09/19   Alfredia Ferguson, PA-C    Current Facility-Administered Medications  Medication Dose Route Frequency Provider Last Rate Last Admin  . 0.9 %  sodium chloride infusion   Intravenous Continuous Guadalupe Dawn, MD 125 mL/hr at 11/27/19 1024 Rate Change at 11/27/19 1024  . heparin injection 5,000 Units  5,000 Units Subcutaneous Q8H Guadalupe Dawn, MD   5,000 Units at 11/27/19 [phone]  . morphine 2 MG/ML injection 2 mg  2 mg Intravenous Q4H PRN Guadalupe Dawn, MD      . ondansetron [facility]) tablet 4 mg  4 mg Oral Q6H PRN Guadalupe Dawn, MD       Or  . ondansetron [facility]) injection 4 mg  4 mg Intravenous Q6H PRN Guadalupe Dawn, MD       Current Outpatient Medications  Medication Sig Dispense Refill  . albuterol (PROVENTIL HFA;VENTOLIN HFA) 108 (90 Base) MCG/ACT inhaler Inhale 1-2 puffs into the lungs every 6 (six) hours as needed for wheezing or shortness of breath.    . gabapentin (NEURONTIN) 100 MG capsule Take 1 capsule (100 mg total) by mouth 2 (two) times daily. 14 capsule 0  . potassium chloride SA (KLOR-CON) 20 MEQ tablet Take 1 tablet (20 mEq total) by mouth daily. 7 tablet 0  . predniSONE (DELTASONE) 10 MG tablet Take 4 tablets (40 mg total) by mouth daily for 14 days, THEN 3 tablets (30 mg total) daily for 14 days, THEN 2 tablets (20 mg total) daily for 14 days, THEN 1 tablet (10 mg total)  daily for 14 days. 140 tablet 0  . dicyclomine (BENTYL) 10 MG capsule Take 1 capsule (10 mg total) by mouth 4 (four) times daily -  before meals and at bedtime. (Patient not taking: Reported on 11/27/2019) 90 capsule 2    Allergies as of 11/27/2019  . (No Known Allergies)     Review of Systems:  Constitutional: No weight loss, fever or chills Skin: No rash  Cardiovascular: No chest pain Respiratory: No SOB  Gastrointestinal: See HPI and otherwise negative Genitourinary: No dysuria  Neurological: No headache, dizziness or syncope Musculoskeletal: No new muscle or joint pain Hematologic: No bleeding  Psychiatric: No history of depression or anxiety    Physical Exam:  Vital signs in last 24 hours: Temp:  [97.9 F (36.6 C)-98 F (36.7 C)] 98 F (36.7 C) (03/29 0517) Pulse Rate:  [93-100] 100 (03/29 0715) Resp:  [14-22] 14 (03/29 0715) BP: (133-145)/(83-93) 133/83 (03/29 0715) SpO2:  [96 %-100 %] 97 % (03/29 0715) Weight:  [61.2 kg] 61.2 kg (03/29 0258)   General:   Pleasant AA male appears to be in mild distress, groaning in pain, Well developed, Well nourished, alert and cooperative Head:  Normocephalic and atraumatic. Eyes:   PEERL, EOMI. No icterus. Conjunctiva pink. Ears:  Normal auditory acuity. Neck:  Supple Throat: Oral cavity and pharynx without inflammation, swelling or lesion.  Lungs: Respirations even and unlabored. Lungs clear to auscultation bilaterally.   No wheezes, crackles, or rhonchi.  Heart: Normal S1, S2. No MRG. Regular rate and rhythm. No peripheral edema, cyanosis or pallor.  Abdomen:  Tense, Marked ttp to light palpation in all four quadrants, Decreased bowel sounds upper quadrants, high pitched in RLQ , Involuntary guarding Rectal:  Not performed.  Msk:  Symmetrical without gross deformities. Peripheral pulses intact.  Extremities:  Without edema, no deformity or joint abnormality.  Neurologic:  Alert and  oriented x4;  grossly normal neurologically.   Skin:   Dry and intact without significant lesions or rashes. Psychiatric: Demonstrates good judgement and reason without abnormal affect or behaviors.   LAB RESULTS: Recent Labs    11/27/19 0258  WBC 13.3*  HGB 13.8  HCT 42.9  PLT 370   BMET Recent Labs    11/27/19 0258  NA 140  K 3.5  CL 97*  CO2 29  GLUCOSE 99  BUN 16  CREATININE 0.91  CALCIUM 9.4   LFT Recent Labs    11/27/19 0258  PROT 6.8  ALBUMIN 3.4*  AST 17  ALT 22  ALKPHOS 60  BILITOT 0.8   STUDIES: CT ABDOMEN PELVIS W CONTRAST  Result Date: 11/27/2019 CLINICAL DATA:  Severe umbilical pain.  History of Crohn's disease. EXAM: CT ABDOMEN AND PELVIS WITH CONTRAST TECHNIQUE: Multidetector CT imaging of the abdomen and pelvis was performed using the standard protocol following bolus administration of intravenous contrast. CONTRAST:  47m OMNIPAQUE IOHEXOL 300 MG/ML  SOLN COMPARISON:  10/30/2019 FINDINGS: Lower chest:  No acute finding.  Minor atelectasis. Hepatobiliary: Cystic densities in the central and right liver.No evidence of biliary obstruction or stone. Pancreas: Unremarkable. Spleen: Unremarkable. Adrenals/Urinary Tract: Negative adrenals. No hydronephrosis or stone. Unremarkable bladder. Stomach/Bowel: Distal ileal inflammation with wall thickening and mucosal hyperenhancement. There is tethering of small bowel loops in the pelvis with a visible fistula between 2 small bowel loops on coronal reformats. There is a separate area of more proximal inflammation in the central lower abdomen with an additional sinus tract spanning 2 small bowel loops, marked on 6:66. The distal ileum is also the level of chronic small bowel obstruction with dilated loops measuring up to 3.5 cm in diameter. No bowel necrosis noted. Vascular/Lymphatic: Mild atherosclerotic calcification. No mass or adenopathy. Reproductive:No pathologic findings. Other: No ascites or pneumoperitoneum. Musculoskeletal: No acute abnormalities.  No  sacroiliitis. IMPRESSION: Active Crohn's enteritis with chronic, partial small bowel obstruction at the distal  ileum. 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 Electronically Signed   By: Monte Fantasia M.D.   On: 11/27/2019 07:13    Impression / Plan:   Impression: 1.  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  Start patient on Solumedrol 40 mg IV daily 2.  Patient to remain n.p.o. for now 3.  Agree with pain meds and antiemetics 4.  Please await further recommendations from Dr. Carlean Purl later today.  Thank you for your kind consultation, we will continue to follow.  Lavone Nian Lemmon  11/27/2019, 10:30 AM    [facility] Attending   I have taken an interval history, reviewed the chart and examined the patient. I agree with the Advanced Practitioner's note, impression and recommendations.   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  Has been unable to get on more efficacious medical treatment (biologics) due to lack of insurance.  He is being evicted, adding to stress of situation.  Options at this point are continued steroids but that does not seem to be working now and I think we need to consider surgical resection and we should consult surgery to see him in consultation tomorrow.  Gatha Mayer, MD, [facility] 11/27/2019 5:19 PM

## 2019-11-27 NOTE — ED Provider Notes (Signed)
I see the patient in signout from Dr. Leonides Schanz.  Briefly the patient is a 44 year old male with a chief complaint of Crohn's disease and has had worsening abdominal pain constipation and nausea and vomiting.  CT scan concerning for small bowel obstruction.  Discussed with medicine for admission.  Discussed with GI who will evaluated bedside.   Deno Etienne, DO 11/27/19 1020

## 2019-11-27 NOTE — ED Provider Notes (Signed)
TIME SEEN: 5:17 AM  CHIEF COMPLAINT: Abdominal pain, vomiting, no bowel movement in 2 days  HPI: Patient is a 44 year old male with history of Crohn's disease, previous bowel obstructions who presents to the emergency department with generalized cramping, severe abdominal pain without radiation for the past 2 days.  He has had nausea, vomiting.  No fevers or chills.  Denies diarrhea.  States no bowel movement has not been passing gas in 2 days.  No blood in the stool previous to this.  He is followed by [facility].  Reports that they currently have him on a prednisone taper.  He was on 40 mg daily for 2 weeks and just dropped down to 30 mg.  States because he does not have insurance he has not been on any other suppressive medications.  No history of abdominal surgery.  PCP - Dr. Zettie Cooley  GI -   ROS: See HPI Constitutional: no fever  Eyes: no drainage  ENT: no runny nose   Cardiovascular:  no chest pain  Resp: no SOB  GI: vomiting GU: no dysuria Integumentary: no rash  Allergy: no hives  Musculoskeletal: no leg swelling  Neurological: no slurred speech ROS otherwise negative  PAST MEDICAL HISTORY/PAST SURGICAL HISTORY:  Past Medical History:  Diagnosis Date  . Asthma   . Crohn's disease ([place])   . SBO (small bowel obstruction) (HCC)     MEDICATIONS:  Prior to Admission medications   Medication Sig Start Date End Date Taking? Authorizing Provider  albuterol (PROVENTIL HFA;VENTOLIN HFA) 108 (90 Base) MCG/ACT inhaler Inhale 1-2 puffs into the lungs every 6 (six) hours as needed for wheezing or shortness of breath.    [provider]  dicyclomine (BENTYL) 10 MG capsule Take 1 capsule (10 mg total) by mouth 4 (four) times daily -  before meals and at bedtime. 11/09/19   Esterwood, [name] S, PA-C  gabapentin (NEURONTIN) 100 MG capsule Take 1 capsule (100 mg total) by mouth 2 (two) times daily. 11/20/19   Orpah Greek, MD  potassium chloride SA (KLOR-CON) 20 MEQ  tablet Take 1 tablet (20 mEq total) by mouth daily. 11/20/19   Pollina, Gwenyth Allegra, MD  predniSONE (DELTASONE) 10 MG tablet Take 4 tablets (40 mg total) by mouth daily for 14 days, THEN 3 tablets (30 mg total) daily for 14 days, THEN 2 tablets (20 mg total) daily for 14 days, THEN 1 tablet (10 mg total) daily for 14 days. 11/09/19 01/04/20  Esterwood, [name] S, PA-C    ALLERGIES:  No Known Allergies  SOCIAL HISTORY:  Social History   Tobacco Use  . Smoking status: Current Every Day Smoker    Packs/day: 0.50    Years: 21.00    Pack years: 10.50    Types: Cigarettes, Cigars  . Smokeless tobacco: Never Used  Substance Use Topics  . Alcohol use: Yes    FAMILY HISTORY: Family History  Problem Relation Age of Onset  . Crohn's disease Mother   . Heart attack Mother        In her late 79s.  This was the cause of her death.  . Colon cancer Father        In his mid-to-late 47s.  As of 2020, patient in his early 36s and cancer said to be in remission.  Marland Kitchen Heart disease Father        has pacemaker    EXAM: BP 138/85 (BP Location: Right Arm)   Pulse 95   Temp 98 F (  36.7 C) (Oral)   Resp (!) 22   Wt 61.2 kg   SpO2 100%   BMI 18.30 kg/m  CONSTITUTIONAL: Alert and oriented and responds appropriately to questions.  Appears uncomfortable but nontoxic and afebrile.  Moaning in pain. HEAD: Normocephalic EYES: Conjunctivae clear, pupils appear equal, EOM appear intact ENT: normal nose; moist mucous membranes NECK: Supple, normal ROM CARD: RRR; S1 and S2 appreciated; no murmurs, no clicks, no rubs, no gallops RESP: Normal chest excursion without splinting or tachypnea; breath sounds clear and equal bilaterally; no wheezes, no rhonchi, no rales, no hypoxia or respiratory distress, speaking full sentences ABD/GI: Abdomen does not appear distended.  Patient keeps abdominal muscles contracted.  Tender to palpation diffusely.  BACK:  The back appears normal EXT: Normal ROM in all joints; no  deformity noted, no edema; no cyanosis SKIN: Normal color for age and race; warm; no rash on exposed skin NEURO: Moves all extremities equally PSYCH: The patient's mood and manner are appropriate.   MEDICAL DECISION MAKING: Patient here with abdominal pain.  Differential includes Crohn's flare, bowel obstruction, appendicitis, diverticulitis, perforation.  Labs show mild leukocytosis but he is on steroids currently.  Otherwise unremarkable.  Will repeat CT of the abdomen pelvis for further evaluation.  Will give IV fluids, pain and nausea medicine.  ED PROGRESS: CT scan pending.  Signed out to Dr. Tyrone Nine to follow-up on CT imaging and dispo per these results.  I reviewed all nursing notes and pertinent previous records as available.  I have interpreted any EKGs, lab and urine results, imaging (as available).      Kojo Liby. was evaluated in Emergency Department on 11/27/2019 for the symptoms described in the history of present illness. He was evaluated in the context of the global COVID-19 pandemic, which necessitated consideration that the patient might be at risk for infection with the SARS-CoV-2 virus that causes COVID-19. Institutional protocols and algorithms that pertain to the evaluation of patients at risk for COVID-19 are in a state of rapid change based on information released by regulatory bodies including the CDC and federal and state organizations. These policies and algorithms were followed during the patient's care in the ED.  Patient was seen wearing N95, face shield, gloves.    Zanobia Griebel, Delice Bison, DO 11/27/19 [phone]

## 2019-11-27 NOTE — Consult Note (Signed)
Reason for Consult: Small bowel obstruction/Crohn's disease Referring Physician: Dr. Mallie Snooks Eric Castillo. is an 44 y.o. male.  HPI: Asked see patient at the request of Dr. Owens Shark due to partial small bowel obstruction.  The patient has a history of Crohn's disease for the last year.  Yesterday he developed periumbilical abdominal pain which he describes as sharp crampy in nature.  It was a 7 out of 10.  It was episodic.  He had one episode of nausea.  No diarrhea or blood in his stool.  He has had a year history of Crohn's disease but due to lack of insurance coverage has not been able to treat this adequately with medicines.  He has been given steroids.  These have not helped his Crohn's and is progressed.  He is not eligible for the biologic treatments due to lack of insurance.  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  Past Medical History:  Diagnosis Date  . Asthma   . Crohn's disease ([place])   . SBO (small bowel obstruction) (HCC)     Past Surgical History:  Procedure Laterality Date  . BIOPSY  12/28/2018   Procedure: BIOPSY;  Surgeon: Mauri Pole, MD;  Location: [facility];  Service: Endoscopy;;  . COLONOSCOPY WITH PROPOFOL N/A 12/28/2018   Procedure: COLONOSCOPY WITH PROPOFOL;  Surgeon: Mauri Pole, MD;  Location: [facility];  Service: Endoscopy;  Laterality: N/A;  . MASS EXCISION     UPPER BACK     Family History  Problem Relation Age of Onset  . Crohn's disease Mother   . Heart attack Mother        In her late 51s.  This was the cause of her death.  . Colon cancer Father        In his mid-to-late 22s.  As of 2020, patient in his early 44s and cancer said to be in remission.  Marland Kitchen Heart disease Father        has pacemaker    Social History:  reports that he has been smoking cigarettes and cigars. He has a 10.50 pack-year smoking history. He has never  used smokeless tobacco. He reports current alcohol use. He reports current drug use. Drug: Marijuana.  Allergies: No Known Allergies  Medications: I have reviewed the patient's current medications.  Results for orders placed or performed during the hospital encounter of 11/27/19 (from the past 48 hour(s))  Lipase, blood     Status: None   Collection Time: 11/27/19  2:58 AM  Result Value Ref Range   Lipase 25 11 - 51 U/L    Comment: Performed at [facility], [place] [address] [place], [place] [address]  Comprehensive metabolic panel     Status: Abnormal   Collection Time: 11/27/19  2:58 AM  Result Value Ref Range   Sodium 140 135 - 145 mmol/L   Potassium 3.5 3.5 - 5.1 mmol/L   Chloride 97 (L) 98 - 111 mmol/L   CO2 29 22 - 32 mmol/L   Glucose, Bld 99 70 - 99 mg/dL    Comment: Glucose reference range applies only to samples taken after fasting for at least 8 hours.   BUN 16 6 - 20 mg/dL   Creatinine, Ser 0.91 0.61 - 1.24 mg/dL   Calcium 9.4 8.9 - 10.3 mg/dL   Total Protein 6.8 6.5 - 8.1 g/dL   Albumin 3.4 (L) 3.5 - 5.0  g/dL   AST 17 15 - 41 U/L   ALT 22 0 - 44 U/L   Alkaline Phosphatase 60 38 - 126 U/L   Total Bilirubin 0.8 0.3 - 1.2 mg/dL   GFR calc non Af Amer >60 >60 mL/min   GFR calc Af Amer >60 >60 mL/min   Anion gap 14 5 - 15    Comment: Performed at [facility] [address], [place], [place] [address]  CBC     Status: Abnormal   Collection Time: 11/27/19  2:58 AM  Result Value Ref Range   WBC 13.3 (H) 4.0 - 10.5 K/uL   RBC 4.87 4.22 - 5.81 MIL/uL   Hemoglobin 13.8 13.0 - 17.0 g/dL   HCT 42.9 39.0 - 52.0 %   MCV 88.1 80.0 - 100.0 fL   MCH 28.3 26.0 - 34.0 pg   MCHC 32.2 30.0 - 36.0 g/dL   RDW 19.8 (H) 11.5 - 15.5 %   Platelets 370 150 - 400 K/uL   nRBC 0.0 0.0 - 0.2 %    Comment: Performed at [facility], [place] [address], [place], [place] [address]  SARS CORONAVIRUS 2 (TAT 6-24 HRS) Nasopharyngeal Nasopharyngeal Swab     Status: None    Collection Time: 11/27/19  7:21 AM   Specimen: Nasopharyngeal Swab  Result Value Ref Range   SARS Coronavirus 2 NEGATIVE NEGATIVE    Comment: (NOTE) SARS-CoV-2 target nucleic acids are NOT DETECTED. The SARS-CoV-2 RNA is generally detectable in upper and lower respiratory specimens during the acute phase of infection. Negative results do not preclude SARS-CoV-2 infection, do not rule out co-infections with other pathogens, and should not be used as the sole basis for treatment or other patient management decisions. Negative results must be combined with clinical observations, patient history, and epidemiological information. The expected result is Negative. Fact Sheet for Patients: SugarRoll.be Fact Sheet for Healthcare Providers: https://www.woods-mathews.com/ This test is not yet approved or cleared by the [place] FDA and  has been authorized for detection and/or diagnosis of SARS-CoV-2 by FDA under an Emergency Use Authorization (EUA). This EUA will remain  in effect (meaning this test can be used) for the duration of the COVID-19 declaration under Section 56 4(b)(1) of the Act, 21 U.S.C. section 360bbb-3(b)(1), unless the authorization is terminated or revoked sooner. Performed at [facility], [place] [address], [place], [place] [address]   Urinalysis, Routine w reflex microscopic     Status: Abnormal   Collection Time: 11/27/19  7:32 AM  Result Value Ref Range   Color, Urine YELLOW YELLOW   APPearance CLEAR CLEAR   Specific Gravity, Urine >1.046 (H) 1.005 - 1.030   pH 6.0 5.0 - 8.0   Glucose, UA NEGATIVE NEGATIVE mg/dL   Hgb urine dipstick NEGATIVE NEGATIVE   Bilirubin Urine NEGATIVE NEGATIVE   Ketones, ur NEGATIVE NEGATIVE mg/dL   Protein, ur NEGATIVE NEGATIVE mg/dL   Nitrite NEGATIVE NEGATIVE   Leukocytes,Ua NEGATIVE NEGATIVE    Comment: Performed at [facility] [address], [place], [place] [address]     CT ABDOMEN PELVIS W CONTRAST  Result Date: 11/27/2019 CLINICAL DATA:  Severe umbilical pain.  History of Crohn's disease. EXAM: CT ABDOMEN AND PELVIS WITH CONTRAST TECHNIQUE: Multidetector CT imaging of the abdomen and pelvis was performed using the standard protocol following bolus administration of intravenous contrast. CONTRAST:  46m OMNIPAQUE IOHEXOL 300 MG/ML  SOLN COMPARISON:  10/30/2019 FINDINGS: Lower chest:  No acute  finding.  Minor atelectasis. Hepatobiliary: Cystic densities in the central and right liver.No evidence of biliary obstruction or stone. Pancreas: Unremarkable. Spleen: Unremarkable. Adrenals/Urinary Tract: Negative adrenals. No hydronephrosis or stone. Unremarkable bladder. Stomach/Bowel: Distal ileal inflammation with wall thickening and mucosal hyperenhancement. There is tethering of small bowel loops in the pelvis with a visible fistula between 2 small bowel loops on coronal reformats. There is a separate area of more proximal inflammation in the central lower abdomen with an additional sinus tract spanning 2 small bowel loops, marked on 6:66. The distal ileum is also the level of chronic small bowel obstruction with dilated loops measuring up to 3.5 cm in diameter. No bowel necrosis noted. Vascular/Lymphatic: Mild atherosclerotic calcification. No mass or adenopathy. Reproductive:No pathologic findings. Other: No ascites or pneumoperitoneum. Musculoskeletal: No acute abnormalities.  No sacroiliitis. IMPRESSION: Active Crohn's enteritis with chronic, partial small bowel obstruction at the distal ileum. 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 Electronically Signed   By: Monte Fantasia M.D.   On: 11/27/2019 07:13    Review of Systems  Constitutional: Positive for fatigue. Negative for activity change.  HENT: Negative for congestion and dental problem.   Eyes: Negative for discharge and itching.  Respiratory:  Negative.   Cardiovascular: Negative.   Gastrointestinal: Positive for abdominal pain and vomiting.  Endocrine: Negative for cold intolerance and heat intolerance.  Genitourinary: Negative for difficulty urinating.  Musculoskeletal: Negative for arthralgias and back pain.  Neurological: Negative.   Hematological: Negative.   Psychiatric/Behavioral: Negative.    Blood pressure (!) 139/94, pulse 94, temperature 98.4 F (36.9 C), temperature source Oral, resp. rate 19, weight 61.2 kg, SpO2 100 %. Physical Exam  Constitutional: He is oriented to person, place, and time. Vital signs are normal. He appears well-developed. He is active.  HENT:  Head: Head is without raccoon's eyes and without contusion.  Right Ear: Hearing and external ear normal.  Left Ear: Hearing and external ear normal.  Eyes: Pupils are equal, round, and reactive to light. Conjunctivae are normal. No scleral icterus.  Neck: No JVD present.  Cardiovascular: Normal rate, regular rhythm and normal heart sounds.  No murmur heard. Respiratory: Effort normal and breath sounds normal. No respiratory distress. He has no wheezes.  GI: He exhibits distension. There is abdominal tenderness in the periumbilical area. There is no rebound and no guarding.  Musculoskeletal:        General: No deformity or edema.     Cervical back: Normal range of motion and neck supple.  Neurological: He is alert and oriented to person, place, and time. No cranial nerve deficit.  Skin: Skin is warm and dry. No rash noted. No erythema.  Psychiatric: He has a normal mood and affect. His behavior is normal. Thought content normal.    Assessment/Plan: Crohn's disease with partial small bowel obstruction  The patient is on steroids but unfortunately is not a candidate for any further medical therapy.  Certainly steroids can be continued but my concern is he will not improve in require surgical therapy.  He may require surgical resection while he is here  for his terminal ileum and given his other problems and situation this might be best for him.  The option is medical management and I discussed both with him tonight.  I am afraid that steroids will not be enough to control his disease.  Dr. Ninfa Linden to follow-up in a.m. to check on patient and decide final plan whether be  surgical resection versus continue medical management.  Eric Castillo 11/27/2019, 6:28 PM

## 2019-11-27 NOTE — Plan of Care (Signed)
  Problem: Education: Goal: Knowledge of General Education information will improve Description Including pain rating scale, medication(s)/side effects and non-pharmacologic comfort measures Outcome: Progressing   

## 2019-11-27 NOTE — ED Triage Notes (Signed)
The pt arrived by [facility] from  Home with abd pain nausea and vomiting  Iv per ems zofran 55m given iv hx of crohns disease

## 2019-11-27 NOTE — H&P (Signed)
[facility] Admission History and Physical Service Pager: [phone]  Patient name: Eric Castillo. Medical record number: [id number] Date of birth: Mar 25, 1976 Age: 44 y.o. Gender: male  Primary Care Provider: Wilber Oliphant, MD Consultants: Gastroenterology Code Status: Full Preferred Emergency Contact: Eric Castillo, father, [phone]  Chief Complaint: Abdominal pain, nausea, constipation  Assessment and Plan: Eric Castillo. is a 44 y.o. male presenting with 2 to 3-day history of intermittent severe abdominal pain, constipation, vomiting.  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  Likely secondary to Crohn's flare.  Patient has been on prednisone 40 mg since early March, actually decrease it to 30 mg 3/28.  Patient with no signs or symptoms of peritonitis at this point.  Make n.p.o., IV fluids to maintain hydration.  Recommended frequent ambulation and chewing gum without swallowing to help increase return of his bowel function.  Gastroenterology following appreciate their recommendations.  Patient was started on Solu-Medrol 40 mg IV. -Admit to inpatient family medicine, Eric Castillo, Eric Castillo, inpatient status -Vital signs per MedSurg routine -GI following, appreciate the recommendations -Solu-Medrol 40 mg IV -N.p.o. for now -Morphine 2 mg every 4 hours for pain control, - Zofran 4 mg every 8 as needed  -Chewing gum as needed -Frequent ambulation -Normal saline at 125 mL/h -Heparin subcu 5000 units 3 times daily in case there is necessity for procedure  Crohn's disease/enteroenteric fistula Recently started on 40 mg daily prednisone in early March.  2 days ago decrease it to 30 mg per his taper.  Has not been able to get any suppressive medications as he has not  had any insurance and there unaffordable for him.  GI following, appreciate their recommendations.  Will defer acute management of the Crohn's flare and fistula to that team. -Solu-Medrol 40 mg IV -Gastroenterology, appreciate their recommendations  History of childhood asthma Patient states he has not needed albuterol in a very very long time.  He states he does not take this anymore.  Will consider a resolved problem, albuterol as needed if develops symptoms.  PMH is significant for Crohn's disease, childhood asthma   FEN/GI: N.p.o. Prophylaxis: Heparin 5000 subcu 3 times daily  Disposition: Likely home pending clinical course  History of Present Illness:  Eric Mohler. is a 44 y.o. male presenting with 2 to 3-day history of progressive abdominal pain, constipation, and vomiting.  The patient states this for started as a tightening of his abdominal muscles and a distention.  He describes it as more of a severe bloating type sensation.  Starting shortly after that he did experience 2-3 episodes of emesis.  Pain became severe enough he decided to come in and get treated.  Of note the patient has known Crohn's disease, he has been taking 40 mg prednisone daily since early March.  He just decrease it to 30 mg daily per his schedule taper on 3/28.  Work-up in the ED significant for CMP, CBC, UA, COVID-19 test, CT abdomen pelvis.  Laboratory work-up significant for specific gravity of urine greater than 1.046, WBC 13.3, albumin 3.4.  Imaging showed active Crohn's enteritis with partial small bowel obstruction at the distal ileum.  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  Patient received 1 dose of Dilaudid 1 mg, Zofran 4 mg, 1 L normal saline bolus in the emergency  department.  Review Of Systems: Per HPI with the following additions:   ROS  Patient Active Problem List   Diagnosis Date Noted  . Small bowel obstruction ([place]) 11/27/2019  . Nausea without vomiting   .  Exacerbation of Crohn's disease with complication ([place]) 35/00/9381  . Crohn's disease ([place]) 12/26/2018  . Abdominal pain   . Family history of colon cancer in father - 39's 12/21/2018  . Tobacco use   . Crohn's disease of ileum with intestinal obstruction ([place])   . SBO (small bowel obstruction) ([place]) 05/03/2018  . Asthma 05/03/2018    Past Medical History: Past Medical History:  Diagnosis Date  . Asthma   . Crohn's disease ([place])   . SBO (small bowel obstruction) (HCC)     Past Surgical History: Past Surgical History:  Procedure Laterality Date  . BIOPSY  12/28/2018   Procedure: BIOPSY;  Surgeon: Eric Pole, MD;  Location: [facility];  Service: Endoscopy;;  . COLONOSCOPY WITH PROPOFOL N/A 12/28/2018   Procedure: COLONOSCOPY WITH PROPOFOL;  Surgeon: Eric Pole, MD;  Location: [facility];  Service: Endoscopy;  Laterality: N/A;  . MASS EXCISION     UPPER BACK     Social History: Social History   Tobacco Use  . Smoking status: Current Every Day Smoker    Packs/day: 0.50    Years: 21.00    Pack years: 10.50    Types: Cigarettes, Cigars  . Smokeless tobacco: Never Used  Substance Use Topics  . Alcohol use: Yes  . Drug use: Yes    Types: Marijuana   Additional social history:  Please also refer to relevant sections of EMR.  Family History: Family History  Problem Relation Age of Onset  . Crohn's disease Mother   . Heart attack Mother        In her late 40s.  This was the cause of her death.  . Colon cancer Father        In his mid-to-late 49s.  As of 2020, patient in his early 30s and cancer said to be in remission.  Marland Kitchen Heart disease Father        has pacemaker    Allergies and Medications: No Known Allergies No current facility-administered medications on file prior to encounter.   Current Outpatient Medications on File Prior to Encounter  Medication Sig Dispense Refill  . albuterol (PROVENTIL HFA;VENTOLIN HFA) 108 (90 Base) MCG/ACT inhaler  Inhale 1-2 puffs into the lungs every 6 (six) hours as needed for wheezing or shortness of breath.    . gabapentin (NEURONTIN) 100 MG capsule Take 1 capsule (100 mg total) by mouth 2 (two) times daily. 14 capsule 0  . potassium chloride SA (KLOR-CON) 20 MEQ tablet Take 1 tablet (20 mEq total) by mouth daily. 7 tablet 0  . predniSONE (DELTASONE) 10 MG tablet Take 4 tablets (40 mg total) by mouth daily for 14 days, THEN 3 tablets (30 mg total) daily for 14 days, THEN 2 tablets (20 mg total) daily for 14 days, THEN 1 tablet (10 mg total) daily for 14 days. 140 tablet 0  . dicyclomine (BENTYL) 10 MG capsule Take 1 capsule (10 mg total) by mouth 4 (four) times daily -  before meals and at bedtime. (Patient not taking: Reported on 11/27/2019) 90 capsule 2    Objective: BP (!) 145/84 (BP Location: Right Arm)   Pulse 91   Temp 99 F (37.2 C) (Oral)   Resp 20   Wt 61.2 kg  SpO2 100%   BMI 18.30 kg/m  Exam: General: 44 year old African-American male, no acute distress, very pleasant Cardiovascular: Regular rate rhythm, no M/R/G, skin warm and dry Respiratory: Lungs clear to auscultation bilaterally, no accessory muscle use Gastrointestinal: Distended, mild abdominal muscle guarding to palpation, hypoactive bowel sounds. MSK: 5/5 strength to all muscle groups bilateral upper extremity, bilateral lower extremity Derm: Skin warm and dry Neuro: No focal neurologic deficit Psych: Very pleasant, appropriate  Labs and Imaging: CBC BMET  Recent Labs  Lab 11/27/19 0258  WBC 13.3*  HGB 13.8  HCT 42.9  PLT 370   Recent Labs  Lab 11/27/19 0258  NA 140  K 3.5  CL 97*  CO2 29  BUN 16  CREATININE 0.91  GLUCOSE 99  CALCIUM 9.4      Guadalupe Dawn, MD 11/27/2019, 10:48 AM PGY-3, [facility] Intern pager: [phone], text pages welcome

## 2019-11-28 DIAGNOSIS — K50012 Crohn's disease of small intestine with intestinal obstruction: Principal | ICD-10-CM

## 2019-11-28 DIAGNOSIS — K50912 Crohn's disease, unspecified, with intestinal obstruction: Secondary | ICD-10-CM

## 2019-11-28 LAB — CBC WITH DIFFERENTIAL/PLATELET
Abs Immature Granulocytes: 0.04 10*3/uL (ref 0.00–0.07)
Basophils Absolute: 0 10*3/uL (ref 0.0–0.1)
Basophils Relative: 0 %
Eosinophils Absolute: 0 10*3/uL (ref 0.0–0.5)
Eosinophils Relative: 0 %
HCT: 36.9 % — ABNORMAL LOW (ref 39.0–52.0)
Hemoglobin: 12 g/dL — ABNORMAL LOW (ref 13.0–17.0)
Immature Granulocytes: 0 %
Lymphocytes Relative: 5 %
Lymphs Abs: 0.5 10*3/uL — ABNORMAL LOW (ref 0.7–4.0)
MCH: 28.2 pg (ref 26.0–34.0)
MCHC: 32.5 g/dL (ref 30.0–36.0)
MCV: 86.6 fL (ref 80.0–100.0)
Monocytes Absolute: 0.9 10*3/uL (ref 0.1–1.0)
Monocytes Relative: 9 %
Neutro Abs: 8.4 10*3/uL — ABNORMAL HIGH (ref 1.7–7.7)
Neutrophils Relative %: 86 %
Platelets: 281 10*3/uL (ref 150–400)
RBC: 4.26 MIL/uL (ref 4.22–5.81)
RDW: 19.6 % — ABNORMAL HIGH (ref 11.5–15.5)
WBC: 9.8 10*3/uL (ref 4.0–10.5)
nRBC: 0 % (ref 0.0–0.2)

## 2019-11-28 LAB — HIV ANTIBODY (ROUTINE TESTING W REFLEX): HIV Screen 4th Generation wRfx: NONREACTIVE

## 2019-11-28 LAB — MAGNESIUM: Magnesium: 1.9 mg/dL (ref 1.7–2.4)

## 2019-11-28 LAB — COMPREHENSIVE METABOLIC PANEL
ALT: 16 U/L (ref 0–44)
AST: 12 U/L — ABNORMAL LOW (ref 15–41)
Albumin: 2.6 g/dL — ABNORMAL LOW (ref 3.5–5.0)
Alkaline Phosphatase: 57 U/L (ref 38–126)
Anion gap: 11 (ref 5–15)
BUN: 13 mg/dL (ref 6–20)
CO2: 24 mmol/L (ref 22–32)
Calcium: 9.2 mg/dL (ref 8.9–10.3)
Chloride: 102 mmol/L (ref 98–111)
Creatinine, Ser: 0.82 mg/dL (ref 0.61–1.24)
GFR calc Af Amer: >60 mL/min (ref >60)
GFR calc non Af Amer: >60 mL/min (ref >60)
Glucose, Bld: 94 mg/dL (ref 70–99)
Potassium: 4.1 mmol/L (ref 3.5–5.1)
Sodium: 137 mmol/L (ref 135–145)
Total Bilirubin: 0.9 mg/dL (ref 0.3–1.2)
Total Protein: 6 g/dL — ABNORMAL LOW (ref 6.5–8.1)

## 2019-11-28 MED ORDER — ACETAMINOPHEN 500 MG PO TABS
1000.0000 mg | ORAL_TABLET | ORAL | Status: AC
  Administered 2019-11-29: 08:00:00 1000 mg via ORAL
  Filled 2019-11-28: qty 2

## 2019-11-28 MED ORDER — CEFAZOLIN SODIUM-DEXTROSE 2-4 GM/100ML-% IV SOLN
2.0000 g | INTRAVENOUS | Status: AC
  Administered 2019-11-29: 08:00:00 2 g via INTRAVENOUS
  Filled 2019-11-28: qty 100

## 2019-11-28 MED ORDER — GABAPENTIN 300 MG PO CAPS
300.0000 mg | ORAL_CAPSULE | ORAL | Status: AC
  Administered 2019-11-29: 08:00:00 300 mg via ORAL
  Filled 2019-11-28 (×2): qty 1

## 2019-11-28 MED ORDER — ENSURE PRE-SURGERY PO LIQD
296.0000 mL | Freq: Once | ORAL | Status: DC
  Filled 2019-11-28: qty 296

## 2019-11-28 MED ORDER — ENSURE PRE-SURGERY PO LIQD
592.0000 mL | Freq: Once | ORAL | Status: AC
  Administered 2019-11-28: 296 mL via ORAL
  Filled 2019-11-28: qty 592

## 2019-11-28 MED ORDER — METHOCARBAMOL 1000 MG/10ML IJ SOLN
500.0000 mg | Freq: Four times a day (QID) | INTRAVENOUS | Status: DC | PRN
  Administered 2019-11-28: 500 mg via INTRAVENOUS
  Filled 2019-11-28: qty 5

## 2019-11-28 MED ORDER — ACETAMINOPHEN 10 MG/ML IV SOLN
1000.0000 mg | Freq: Four times a day (QID) | INTRAVENOUS | Status: AC
  Administered 2019-11-28 – 2019-11-29 (×4): 1000 mg via INTRAVENOUS
  Filled 2019-11-28 (×4): qty 100

## 2019-11-28 MED ORDER — CHLORHEXIDINE GLUCONATE CLOTH 2 % EX PADS
6.0000 | MEDICATED_PAD | Freq: Once | CUTANEOUS | Status: AC
  Administered 2019-11-29: 6 via TOPICAL

## 2019-11-28 MED ORDER — MAGNESIUM SULFATE 2 GM/50ML IV SOLN
2.0000 g | Freq: Once | INTRAVENOUS | Status: AC
  Administered 2019-11-28: 2 g via INTRAVENOUS
  Filled 2019-11-28: qty 50

## 2019-11-28 MED ORDER — CELECOXIB 400 MG PO CAPS
400.0000 mg | ORAL_CAPSULE | ORAL | Status: AC
  Administered 2019-11-29: 400 mg via ORAL
  Filled 2019-11-28: qty 1
  Filled 2019-11-28: qty 2

## 2019-11-28 MED ORDER — CHLORHEXIDINE GLUCONATE CLOTH 2 % EX PADS
6.0000 | MEDICATED_PAD | Freq: Once | CUTANEOUS | Status: AC
  Administered 2019-11-28: 6 via TOPICAL

## 2019-11-28 MED ORDER — MORPHINE SULFATE (PF) 2 MG/ML IV SOLN
2.0000 mg | INTRAVENOUS | Status: DC | PRN
  Administered 2019-11-28 – 2019-11-29 (×4): 2 mg via INTRAVENOUS
  Filled 2019-11-28 (×4): qty 1

## 2019-11-28 MED ORDER — ACETAMINOPHEN 325 MG PO TABS
650.0000 mg | ORAL_TABLET | Freq: Four times a day (QID) | ORAL | Status: DC | PRN
  Administered 2019-12-01 – 2019-12-10 (×2): 650 mg via ORAL
  Filled 2019-11-28 (×2): qty 2

## 2019-11-28 MED ORDER — ALVIMOPAN 12 MG PO CAPS
12.0000 mg | ORAL_CAPSULE | ORAL | Status: AC
  Administered 2019-11-29: 08:00:00 12 mg via ORAL
  Filled 2019-11-28 (×2): qty 1

## 2019-11-28 NOTE — Discharge Summary (Signed)
[facility] Discharge Summary  Patient name: Eric Castillo. Medical record number: [id number] Date of birth: 1976-07-14 Age: 44 y.o. Gender: male Date of Admission: 11/27/2019  Date of Discharge: 12/11/2019 Admitting Physician: Martyn Malay, MD  Primary Care Provider: Wilber Oliphant, MD Consultants: General surgery  Indication for Hospitalization: Small bowel obstruction in setting of Crohn's disease with fistula  Discharge Diagnoses/Problem List:  Active Problems:   Small bowel obstruction ([place])   Encounter for nasogastric (NG) tube placement   Ileus following gastrointestinal surgery ([place])   Abscess, intra-abdominal, postoperative   On total parenteral nutrition (TPN)  Disposition: Home  Discharge Condition: Improved and stable  Discharge Exam:  General: Alert and cooperative and appears to be in no acute distress, overall appears in good spirits Cardio: Normal S1 and S2, no S3 or S4. Rhythm is regular. No murmurs or rubs.   Pulm: Clear to auscultation bilaterally, no crackles, wheezing, or diminished breath sounds. Normal respiratory effort, stable on room air Abdomen: Bowel sounds present throughout, abdominal wound dressing and center of abdomen dry and intact, skin is not warm to touch, abdomen is soft and nontender to palpation Extremities: No peripheral edema. Warm/ well perfused.  Strong radial and pedal pulses.  Dermatology: Patient with abdominal wound dressing dry and intact, no other lesions or ulcerations present Neuro: Patient is alert and oriented   Brief Hospital Course:  Eric Labrie. is a 44 y.o. male with history of Crohn's disease, cannabis use and asthma, presenting with 2 to 3-day history of intermittent severe abdominal pain, constipation, vomiting.  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  His hospital course is outlined below.   SBO with Crohn's Disease Admission details  can be found in H&P.  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  Patient started on standard SBO protocol.  GI consulted given patient's severe Crohn's, who recommended Solumedrol and surgery consultation for consideration of surgical resection.  Surgery was consulted and recommended exploratory laparotomy for ileocecectomy.  Patient was taken to the OR on 3/31 for Ex Lap with ileocecetomy, appendectomy.  On POD#5 he had BM.  Patient was on TPN until 4/9 when he successfully transitioned to liquid diet. Per surgery's recommendations, patient was continued on IV Zosyn from 3/31 until 12/10/19. His WBC continued to increase  to a max of 21 and so abdominal and pelvic CT was completed and showed two fluid collections in his pelvis. The patient was taken to the OR for an attempt to place drains for  the collections of fluid by IR on 12/06/19 but the collections had decreased in size so no drains were placed. The patient was afebrile and his white blood cell count normalized to 9.9 by the time of discharge so he was continued on IV Zocysn and transitioned to PO Augmentin on 4/10 for a total of 2 days. Augmentin was not continued at the time of discharge per surgery's recommendation.   Issues for Follow Up:  1. Patient is to follow-up with general surgery as scheduled. 2. Patient had elevated liver enzymes and alk phos.  These were down trending at the time of discharge.  Recommend checking CMP to ensure that these values are returned to baseline.  Significant Procedures:   Ileocecectomy 3/31  Significant Labs and Imaging:  Recent Labs  Lab 12/08/19 0944 12/10/19 0441 12/11/19 0349  WBC 15.7* 11.0* 9.8  HGB 11.1* 10.4* 10.1*  HCT 34.7* 32.4* 31.7*  PLT 694* 730* 711*   Recent Labs  Lab 12/06/19 0453 12/06/19 0453 12/07/19 0428 12/07/19 0428 12/10/19 0441 12/10/19 0617 12/11/19 0349  NA 136  --  134*  --   --  135 138  K 4.0    < > 4.3   < >  --  4.0 4.2  CL 102  --  100  --   --  98 101  CO2 23  --  23  --   --  24 27  GLUCOSE 109*  --  109*  --   --  91 105*  BUN 21*  --  20  --   --  21* 19  CREATININE 0.76  --  0.77  --   --  0.95 0.85  CALCIUM 9.5  --  9.5  --   --  9.5 9.5  MG  --   --  2.0  --   --  1.9  --   PHOS  --   --  3.8  --  3.8  --   --   ALKPHOS  --   --  163*  --   --  296* 240*  AST  --   --  34  --   --  75* 39  ALT  --   --  58*  --   --  127* 93*  ALBUMIN  --   --  2.8*  --   --  2.8* 2.7*   < > = values in this interval not displayed.   CT ABDOMEN PELVIS W CONTRAST  Result Date: 11/27/2019 CLINICAL DATA:  Severe umbilical pain.  History of Crohn's disease. EXAM: CT ABDOMEN AND PELVIS WITH CONTRAST TECHNIQUE: Multidetector CT imaging of the abdomen and pelvis was performed using the standard protocol following bolus administration of intravenous contrast. CONTRAST:  70m OMNIPAQUE IOHEXOL 300 MG/ML  SOLN COMPARISON:  10/30/2019 FINDINGS: Lower chest:  No acute finding.  Minor atelectasis. Hepatobiliary: Cystic densities in the central and right liver.No evidence of biliary obstruction or stone. Pancreas: Unremarkable. Spleen: Unremarkable. Adrenals/Urinary Tract: Negative adrenals. No hydronephrosis or stone. Unremarkable bladder. Stomach/Bowel: Distal ileal inflammation with wall thickening and mucosal hyperenhancement. There is tethering of small bowel loops in the pelvis with a visible fistula between 2 small bowel loops on coronal reformats. There is a separate area of more proximal inflammation in the central lower abdomen with an additional sinus tract spanning 2 small bowel loops, marked on 6:66. The distal ileum is also the level of chronic small bowel obstruction with dilated loops measuring up to 3.5 cm in diameter. No bowel necrosis noted. Vascular/Lymphatic: Mild atherosclerotic calcification. No mass or adenopathy. Reproductive:No pathologic findings. Other: No ascites or  pneumoperitoneum. Musculoskeletal: No acute abnormalities.  No sacroiliitis. IMPRESSION: Active Crohn's enteritis with chronic, partial small bowel obstruction at the distal ileum. 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 Electronically Signed   By: JMonte FantasiaM.D.   On: 11/27/2019 07:13    Results/Tests Pending at Time of Discharge: None  Discharge Medications:  Allergies as of 12/11/2019   No Known Allergies     Medication List    STOP taking these medications   dicyclomine 10 MG capsule Commonly known as: BENTYL   gabapentin 100 MG capsule Commonly known as: Neurontin   potassium chloride SA 20 MEQ tablet Commonly known as: KLOR-CON   predniSONE 10 MG  tablet Commonly known as: DELTASONE     TAKE these medications   acetaminophen 500 MG tablet Commonly known as: TYLENOL Take 2 tablets (1,000 mg total) by mouth every 6 (six) hours as needed for mild pain.   albuterol 108 (90 Base) MCG/ACT inhaler Commonly known as: VENTOLIN HFA Inhale 1-2 puffs into the lungs every 6 (six) hours as needed for wheezing or shortness of breath.   oxyCODONE 5 MG immediate release tablet Commonly known as: Oxy IR/ROXICODONE Take 1-2 tablets (5-10 mg total) by mouth every 4 (four) hours as needed for moderate pain or severe pain.   pantoprazole 40 MG tablet Commonly known as: PROTONIX Take 1 tablet (40 mg total) by mouth daily. Start taking on: December 12, 2019       Discharge Instructions: Please refer to Patient Instructions section of EMR for full details.  Patient was counseled important signs and symptoms that should prompt return to medical care, changes in medications, dietary instructions, activity restrictions, and follow up appointments.   Follow-Up Appointments: Follow-up Information    [place]. Go to.   Why: December 13, 2019 at 1010 am  Contact information: [place] [phone]       [facility], [place] Follow up.   Specialty: [place] Why: Please f/u for further assistance with Medicaid enrollment, assistance programs.  Contact information: [place] [address] [phone]        Coralie Keens, MD. Go on 01/02/2020.   Specialty: General Surgery Why: Follow up appointment scheduled for 2:10 PM. Please arrive 30 min prior to appointment time. Bring photo ID and insurance information.  Contact information: [place] [address] [phone]        Gatha Mayer, MD. Go on 01/16/2020.   Specialty: Gastroenterology Why: At 3:30 pm for follow up.  Contact information: [address] [address] [phone]           Stark Klein, MD 12/11/2019, 10:51 PM PGY-1, [place]

## 2019-11-28 NOTE — Progress Notes (Signed)
Family Medicine Teaching Service Daily Progress Note Intern Pager: [phone]  Patient name: Eric Castillo. Medical record number: [id number] Date of birth: 01-25-1976 Age: 44 y.o. Gender: male  Primary Care Provider: Wilber Oliphant, MD Consultants: General Surgery, GI Code Status: Full  Pt Overview and Major Events to Date:  11/27/19- admitted, SBO  Assessment and Plan: Eric Mich. is a 44 y.o. male presenting with 2 to 3-day history of intermittent severe abdominal pain, constipation, vomiting.  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  Likely secondary to Crohn's flare.  Patient has been on prednisone 40 mg since early March, actually decrease it to 30 mg 3/28. He is unable to afford biologics due to no insurance, and steroids are clearly not controlling his symptoms. GI and general surgery are following, appreciate their recommendations. Patient's pain is still not controlled, morphine increased to q3h. Plan for surgery tomorrow.  No flatus, BM, cannot tolerate PO. Patient will remain NPO w/ sips/chips today, IV fluids to maintain hydration.  Recommended frequent ambulation and chewing gum without swallowing to help increase return of his bowel function.  Continue Solu-Medrol 40 mg IV. -Vital signs per MedSurg routine -GI following, appreciate the recommendations -General surgery following, appreciate recommendations -Solu-Medrol 40 mg IV -N.p.o. for now -Morphine 2 mg every 3 hours for pain control, - Zofran 4 mg every 8 as needed  -Chewing gum as needed -Frequent ambulation -Normal saline at 125 mL/h -Heparin subcu 5000 units 3 times daily for procedure  Crohn's disease/enteroenteric fistula Recently started on 40 mg daily prednisone in early March.  Decreased to 30 mg on 3/27 per  his taper.  Has not been able to get any suppressive medications as he has not had any insurance and there unaffordable for him.  GI following, appreciate their recommendations.  Going for surgery tomorrow with gen surg. TOC consulted to assist with medications after surgery. -Solu-Medrol 40 mg IV -Gastroenterology, appreciate their recommendations -Gen surg, appreciate their recommendations -TOC, assist with medications  History of childhood asthma Patient states he has not needed albuterol in a very very long time.  He states he does not take this anymore.  Will consider a resolved problem, albuterol as needed if develops symptoms  FEN/GI: NPO w/ sips/ice chips PPx: Heparin 5000 sub q TID  Disposition: Staying for pain management and surgery tomorrow, likely home eventually when stabilized  Subjective:  Patient not passing flatus, no bm, significant pain, does not want to eat. Surgery to perform procedure tomorrow.  Objective: Temp:  [98.4 F (36.9 C)-98.7 F (37.1 C)] 98.6 F (37 C) (03/30 0804) Pulse Rate:  [68-94] 76 (03/30 0804) Resp:  [16-19] 17 (03/30 0804) BP: (119-139)/(65-94) 126/65 (03/30 0804) SpO2:  [99 %-100 %] 99 % (03/30 0804) Physical Exam: General: WNWD middle-aged AA man resting in bed, grimacing in pain, NAD Cardiovascular: RRR, no m/r/g Respiratory: CTAB, no increased WOB, no wheezes/rales/rhonchi Abdomen: tense, tender to palpation diffusely, not distended, few bowel sounds Extremities: warm, dry, no edema  Laboratory: Recent Labs  Lab 11/27/19 0258 11/28/19 0422  WBC 13.3* 9.8  HGB 13.8 12.0*  HCT 42.9 36.9*  PLT 370 281   Recent Labs  Lab 11/27/19 0258 11/28/19 0422  NA 140 137  K 3.5 4.1  CL 97* 102  CO2 29 24  BUN 16 13  CREATININE 0.91  0.82  CALCIUM 9.4 9.2  PROT 6.8 6.0*  BILITOT 0.8 0.9  ALKPHOS 60 57  ALT 22 16  AST 17 12*  GLUCOSE 99 94   Imaging/Diagnostic Tests: CT ABDOMEN PELVIS W CONTRAST  Result Date:  11/27/2019 CLINICAL DATA:  Severe umbilical pain.  History of Crohn's disease. EXAM: CT ABDOMEN AND PELVIS WITH CONTRAST TECHNIQUE: Multidetector CT imaging of the abdomen and pelvis was performed using the standard protocol following bolus administration of intravenous contrast. CONTRAST:  36m OMNIPAQUE IOHEXOL 300 MG/ML  SOLN COMPARISON:  10/30/2019 FINDINGS: Lower chest:  No acute finding.  Minor atelectasis. Hepatobiliary: Cystic densities in the central and right liver.No evidence of biliary obstruction or stone. Pancreas: Unremarkable. Spleen: Unremarkable. Adrenals/Urinary Tract: Negative adrenals. No hydronephrosis or stone. Unremarkable bladder. Stomach/Bowel: Distal ileal inflammation with wall thickening and mucosal hyperenhancement. There is tethering of small bowel loops in the pelvis with a visible fistula between 2 small bowel loops on coronal reformats. There is a separate area of more proximal inflammation in the central lower abdomen with an additional sinus tract spanning 2 small bowel loops, marked on 6:66. The distal ileum is also the level of chronic small bowel obstruction with dilated loops measuring up to 3.5 cm in diameter. No bowel necrosis noted. Vascular/Lymphatic: Mild atherosclerotic calcification. No mass or adenopathy. Reproductive:No pathologic findings. Other: No ascites or pneumoperitoneum. Musculoskeletal: No acute abnormalities.  No sacroiliitis. IMPRESSION: Active Crohn's enteritis with chronic, partial small bowel obstruction at the distal ileum. 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 Electronically Signed   By: JMonte FantasiaM.D.   On: 11/27/2019 07:13   MGladys Damme MD 11/28/2019, 11:44 AM PGY-1, CManchester CenterIntern pager: [phone] text pages welcome

## 2019-11-28 NOTE — Progress Notes (Signed)
   Patient Name: Eric Castillo. Date of Encounter: 11/28/2019, 10:36 AM    Subjective  'I am in pain" Tearful at times Surgery has seen him and considering resection   Objective  BP 126/65 (BP Location: Right Arm)   Pulse 76   Temp 98.6 F (37 C) (Oral)   Resp 17   Wt 61.2 kg   SpO2 99%   BMI 18.30 kg/m  NAD     Assessment and Plan  Crohn's ileitis w/ obstruction and fistulae Lack of insurance precludes typical medical management  I think surgery best option now - appreciate help from surgery. Will follow up I changed morphine frequency to q 3 Would consider anxiolytic given situational stress with job loss, eviction and health problems     Gatha Mayer, MD, [facility] 11/28/2019 10:36 AM

## 2019-11-28 NOTE — Social Work (Signed)
CSW acknowledging consult for "insurance assistance"- [facility] team unable to assist w/ insurance enrollment. CSW will add DSS information to f/u regarding enrollment in Medicaid to AVS. Pt has been provided w/ community resources by [facility] at this time.   Westley Hummer, MSW, [place] Work

## 2019-11-28 NOTE — Progress Notes (Signed)
[facility] Progress Note     Subjective: CC-  Abdomen feeling a little better since admission with pain medication, but continues to have intermittent crampy abdominal pain. Denies any current nausea or recent emesis. No flatus or BM. Last BM 3 days ago. Does not feel hungry.  Objective: Vital signs in last 24 hours: Temp:  [98.4 F (36.9 C)-99 F (37.2 C)] 98.6 F (37 C) (03/30 0804) Pulse Rate:  [68-94] 76 (03/30 0804) Resp:  [16-20] 17 (03/30 0804) BP: (119-145)/(65-94) 126/65 (03/30 0804) SpO2:  [99 %-100 %] 99 % (03/30 0804) Last BM Date: 11/25/19  Intake/Output from previous day: 03/29 0701 - 03/30 0700 In: 1319.5 [I.V.:1319.5] Out: 350 [Urine:350] Intake/Output this shift: No intake/output data recorded.  PE: Gen:  Alert, NAD, pleasant HEENT: EOM's intact, pupils equal and round Card:  RRR Pulm:  CTAB, no W/R/R, rate and effort normal Abd: Soft, mild distension, few BS heard, no HSM, no hernia, TTP periumbilical and lower abdomen without peritonitis  Lab Results:  Recent Labs    11/27/19 0258 11/28/19 0422  WBC 13.3* 9.8  HGB 13.8 12.0*  HCT 42.9 36.9*  PLT 370 281   BMET Recent Labs    11/27/19 0258 11/28/19 0422  NA 140 137  K 3.5 4.1  CL 97* 102  CO2 29 24  GLUCOSE 99 94  BUN 16 13  CREATININE 0.91 0.82  CALCIUM 9.4 9.2   PT/INR No results for input(s): LABPROT, INR in the last 72 hours. CMP     Component Value Date/Time   NA 137 11/28/2019 0422   K 4.1 11/28/2019 0422   CL 102 11/28/2019 0422   CO2 24 11/28/2019 0422   GLUCOSE 94 11/28/2019 0422   BUN 13 11/28/2019 0422   CREATININE 0.82 11/28/2019 0422   CALCIUM 9.2 11/28/2019 0422   PROT 6.0 (L) 11/28/2019 0422   ALBUMIN 2.6 (L) 11/28/2019 0422   AST 12 (L) 11/28/2019 0422   ALT 16 11/28/2019 0422   ALKPHOS 57 11/28/2019 0422   BILITOT 0.9 11/28/2019 0422   GFRNONAA >60 11/28/2019 0422   GFRAA >60 11/28/2019 0422   Lipase     Component Value Date/Time    LIPASE 25 11/27/2019 0258       Studies/Results: CT ABDOMEN PELVIS W CONTRAST  Result Date: 11/27/2019 CLINICAL DATA:  Severe umbilical pain.  History of Crohn's disease. EXAM: CT ABDOMEN AND PELVIS WITH CONTRAST TECHNIQUE: Multidetector CT imaging of the abdomen and pelvis was performed using the standard protocol following bolus administration of intravenous contrast. CONTRAST:  69m OMNIPAQUE IOHEXOL 300 MG/ML  SOLN COMPARISON:  10/30/2019 FINDINGS: Lower chest:  No acute finding.  Minor atelectasis. Hepatobiliary: Cystic densities in the central and right liver.No evidence of biliary obstruction or stone. Pancreas: Unremarkable. Spleen: Unremarkable. Adrenals/Urinary Tract: Negative adrenals. No hydronephrosis or stone. Unremarkable bladder. Stomach/Bowel: Distal ileal inflammation with wall thickening and mucosal hyperenhancement. There is tethering of small bowel loops in the pelvis with a visible fistula between 2 small bowel loops on coronal reformats. There is a separate area of more proximal inflammation in the central lower abdomen with an additional sinus tract spanning 2 small bowel loops, marked on 6:66. The distal ileum is also the level of chronic small bowel obstruction with dilated loops measuring up to 3.5 cm in diameter. No bowel necrosis noted. Vascular/Lymphatic: Mild atherosclerotic calcification. No mass or adenopathy. Reproductive:No pathologic findings. Other: No ascites or pneumoperitoneum. Musculoskeletal: No acute abnormalities.  No sacroiliitis. IMPRESSION: Active Crohn's enteritis  with chronic, partial small bowel obstruction at the distal ileum. 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 Electronically Signed   By: Monte Fantasia M.D.   On: 11/27/2019 07:13    Anti-infectives: Anti-infectives (From admission, onward)   None       Assessment/Plan Tobacco abuse - 1 black and mild daily Asthma - albuterol  inhaler PRN  Partial SBO 2/2 active Crohns with fistulizing disease - Patient failing medical management with steroids. Will need surgery this admission. Will discuss timing with MD, but likely plan for tomorrow.  ID - none FEN - IVF, ice chips VTE - SCDs, sq heparin Foley - none Follow up - TBD   LOS: 1 day    Wellington Hampshire, [facility] Surgery 11/28/2019, 8:54 AM Please see Amion for pager number during day hours 7:00am-4:30pm

## 2019-11-28 NOTE — TOC Initial Note (Addendum)
Transition of Care (TOC) - Initial/Assessment Note    Patient Details  Name: Eric Castillo. MRN: [id number] Date of Birth: 03-22-1976  Transition of Care [facility]) CM/SW Contact:    Marilu Favre, RN Phone Number: 11/28/2019, 11:32 AM  Clinical Narrative:                 Spoke to patient at bedside.   PCP listed as DR Zettie Cooley , patient states he does not know who that is , explain family medicine on [address], patient states he has not been there. WIll check records.   Patient has no insurance. 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 However, apartment complex sold and 4 days ago he was told he no longer had a job or an apartment. Provided housing resources.   Patient has no friends or family in [place]. He moved here to be with his father , who passed away this last November 24, 2022 with Cancer. WIll continue to follow. Use TOC pharmacy at discharge. Will help with scripts.  Will follow up and see if patient is followed by [facility] for PCP. If not [facility] Spoke to Dr Chauncey Reading. Family Medicine has taken care of him in the hospital. They will follow as PCP if patient wants, or he can go to [place]. Patient would like [facility], scheduled an appointment for December 13, 2019 at 1010 am  Expected Discharge Plan: Home/Self Care Barriers to Discharge: Continued Medical Work up   Patient Goals and CMS Choice Patient states their goals for this hospitalization and ongoing recovery are:: to feel better CMS Medicare.gov Compare Post Acute Care list provided to:: Patient Choice offered to / list presented to : NA  Expected Discharge Plan and Services Expected Discharge Plan: Home/Self Care In-house Referral: Financial Counselor Discharge Planning Services: CM Consult   Living arrangements for the past 2 months: Apartment                 DME Arranged: N/A         HH Arranged: NA           Prior Living Arrangements/Services Living arrangements for the past 2 months: Apartment Lives with:: Self Patient language and need for interpreter reviewed:: Yes Do you feel safe going back to the place where you live?: (Patient states he cannot return)      Need for Family Participation in Patient Care: No (Comment) Care giver support system in place?: No (comment)   Criminal Activity/Legal Involvement Pertinent to Current Situation/Hospitalization: No - Comment as needed  Activities of Daily Living      Permission Sought/Granted   Permission granted to share information with : No              Emotional Assessment Appearance:: Appears stated age Attitude/Demeanor/Rapport: Engaged Affect (typically observed): Accepting Orientation: : Oriented to Self, Oriented to Place, Oriented to  Time, Oriented to Situation Alcohol / Substance Use: Not Applicable Psych Involvement: No (comment)  Admission diagnosis:  Small bowel obstruction ([place]) [K56.609] Patient Active Problem List   Diagnosis Date Noted  . Small bowel obstruction ([place]) 11/27/2019  . Nausea without vomiting   . Exacerbation of Crohn's disease with complication ([place]) 74/07/8785  . Crohn's disease ([place]) 12/26/2018  . Abdominal pain   . Family history of colon cancer in father - 73's 12/21/2018  . Tobacco use   . Crohn's disease of  ileum with intestinal obstruction ([place])   . SBO (small bowel obstruction) ([place]) 05/03/2018  . Asthma 05/03/2018   PCP:  Wilber Oliphant, MD Pharmacy:   [place], [place] [address] Phone: [phone] Fax: [phone]  [facility] [place], [place] - [address] [place] [address] Phone: [phone] Fax: [place], [place] - [address] [place]  [address] Phone: [phone] Fax: [phone]     Social Determinants of Health (SDOH) Interventions    Readmission Risk Interventions Readmission Risk Prevention Plan 12/30/2018  Post Dischage Appt Complete  Medication Screening Complete  Transportation Screening Complete

## 2019-11-29 ENCOUNTER — Inpatient Hospital Stay (HOSPITAL_COMMUNITY): Payer: Self-pay | Admitting: Anesthesiology

## 2019-11-29 ENCOUNTER — Inpatient Hospital Stay: Payer: Self-pay

## 2019-11-29 ENCOUNTER — Encounter (HOSPITAL_COMMUNITY): Payer: Self-pay | Admitting: Family Medicine

## 2019-11-29 ENCOUNTER — Encounter (HOSPITAL_COMMUNITY)
Admission: EM | Disposition: A | Discharge status: Home / Self Care | Payer: Self-pay | Source: Home / Self Care | Attending: Family Medicine

## 2019-11-29 ENCOUNTER — Inpatient Hospital Stay (HOSPITAL_COMMUNITY): Payer: Self-pay

## 2019-11-29 HISTORY — PX: ILEOCECETOMY: SHX5857

## 2019-11-29 HISTORY — PX: APPENDECTOMY: SHX54

## 2019-11-29 HISTORY — PX: LAPAROTOMY: SHX154

## 2019-11-29 LAB — CBC
HCT: 35 % — ABNORMAL LOW (ref 39.0–52.0)
HCT: 39.3 % (ref 39.0–52.0)
Hemoglobin: 11.5 g/dL — ABNORMAL LOW (ref 13.0–17.0)
Hemoglobin: 12.6 g/dL — ABNORMAL LOW (ref 13.0–17.0)
MCH: 28.8 pg (ref 26.0–34.0)
MCH: 28.1 pg (ref 26.0–34.0)
MCHC: 32.9 g/dL (ref 30.0–36.0)
MCHC: 32.1 g/dL (ref 30.0–36.0)
MCV: 87.5 fL (ref 80.0–100.0)
MCV: 87.5 fL (ref 80.0–100.0)
Platelets: 287 10*3/uL (ref 150–400)
Platelets: 306 10*3/uL (ref 150–400)
RBC: 4 MIL/uL — ABNORMAL LOW (ref 4.22–5.81)
RBC: 4.49 MIL/uL (ref 4.22–5.81)
RDW: 19.2 % — ABNORMAL HIGH (ref 11.5–15.5)
RDW: 19.1 % — ABNORMAL HIGH (ref 11.5–15.5)
WBC: 8.9 10*3/uL (ref 4.0–10.5)
WBC: 17.5 10*3/uL — ABNORMAL HIGH (ref 4.0–10.5)
nRBC: 0 % (ref 0.0–0.2)
nRBC: 0 % (ref 0.0–0.2)

## 2019-11-29 LAB — BASIC METABOLIC PANEL
Anion gap: 11 (ref 5–15)
BUN: 10 mg/dL (ref 6–20)
CO2: 23 mmol/L (ref 22–32)
Calcium: 8.9 mg/dL (ref 8.9–10.3)
Chloride: 102 mmol/L (ref 98–111)
Creatinine, Ser: 0.79 mg/dL (ref 0.61–1.24)
GFR calc Af Amer: >60 mL/min (ref >60)
GFR calc non Af Amer: >60 mL/min (ref >60)
Glucose, Bld: 127 mg/dL — ABNORMAL HIGH (ref 70–99)
Potassium: 3.9 mmol/L (ref 3.5–5.1)
Sodium: 136 mmol/L (ref 135–145)

## 2019-11-29 LAB — GLUCOSE, CAPILLARY
Glucose-Capillary: 144 mg/dL — ABNORMAL HIGH (ref 70–99)
Glucose-Capillary: 118 mg/dL — ABNORMAL HIGH (ref 70–99)

## 2019-11-29 LAB — PREPARE RBC (CROSSMATCH)

## 2019-11-29 LAB — SURGICAL PCR SCREEN
MRSA, PCR: NEGATIVE
Staphylococcus aureus: NEGATIVE

## 2019-11-29 LAB — PREALBUMIN: Prealbumin: 16.2 mg/dL — ABNORMAL LOW (ref 18–38)

## 2019-11-29 LAB — ABO/RH: ABO/RH(D): O POS

## 2019-11-29 SURGERY — LAPAROTOMY, EXPLORATORY
Anesthesia: General | Site: Abdomen

## 2019-11-29 MED ORDER — HYDROMORPHONE HCL 1 MG/ML IJ SOLN
0.2500 mg | INTRAMUSCULAR | Status: DC | PRN
  Administered 2019-11-29 (×4): 0.5 mg via INTRAVENOUS

## 2019-11-29 MED ORDER — SUCCINYLCHOLINE CHLORIDE 200 MG/10ML IV SOSY
PREFILLED_SYRINGE | INTRAVENOUS | Status: AC
  Filled 2019-11-29: qty 10

## 2019-11-29 MED ORDER — DIPHENHYDRAMINE HCL 50 MG/ML IJ SOLN: 12.5000 mg | Freq: Four times a day (QID) | INTRAMUSCULAR | Status: DC | PRN

## 2019-11-29 MED ORDER — SODIUM CHLORIDE 0.9% FLUSH: 9.0000 mL | INTRAVENOUS | Status: DC | PRN

## 2019-11-29 MED ORDER — DEXAMETHASONE SODIUM PHOSPHATE 10 MG/ML IJ SOLN
INTRAMUSCULAR | Status: AC
  Filled 2019-11-29: qty 1

## 2019-11-29 MED ORDER — ONDANSETRON HCL 4 MG/2ML IJ SOLN
INTRAMUSCULAR | Status: AC
  Filled 2019-11-29: qty 2

## 2019-11-29 MED ORDER — ONDANSETRON HCL 4 MG/2ML IJ SOLN: 4.0000 mg | Freq: Four times a day (QID) | INTRAMUSCULAR | Status: DC | PRN

## 2019-11-29 MED ORDER — ACETAMINOPHEN 10 MG/ML IV SOLN
INTRAVENOUS | Status: AC
  Filled 2019-11-29: qty 100

## 2019-11-29 MED ORDER — KETAMINE HCL 50 MG/5ML IJ SOSY
PREFILLED_SYRINGE | INTRAMUSCULAR | Status: AC
  Filled 2019-11-29: qty 5

## 2019-11-29 MED ORDER — HEMOSTATIC AGENTS (NO CHARGE) OPTIME
TOPICAL | Status: DC | PRN
  Administered 2019-11-29 (×2): 1 via TOPICAL

## 2019-11-29 MED ORDER — FENTANYL CITRATE (PF) 250 MCG/5ML IJ SOLN
INTRAMUSCULAR | Status: AC
  Filled 2019-11-29: qty 5

## 2019-11-29 MED ORDER — DIPHENHYDRAMINE HCL 12.5 MG/5ML PO ELIX: 12.5000 mg | ORAL_SOLUTION | Freq: Four times a day (QID) | ORAL | Status: DC | PRN

## 2019-11-29 MED ORDER — LACTATED RINGERS IV SOLN: INTRAVENOUS | Status: DC

## 2019-11-29 MED ORDER — CHLORHEXIDINE GLUCONATE CLOTH 2 % EX PADS
6.0000 | MEDICATED_PAD | Freq: Every day | CUTANEOUS | Status: DC
  Administered 2019-11-30 – 2019-12-11 (×11): 6 via TOPICAL

## 2019-11-29 MED ORDER — PROPOFOL 10 MG/ML IV BOLUS
INTRAVENOUS | Status: AC
  Filled 2019-11-29: qty 40

## 2019-11-29 MED ORDER — ROCURONIUM BROMIDE 10 MG/ML (PF) SYRINGE
PREFILLED_SYRINGE | INTRAVENOUS | Status: AC
  Filled 2019-11-29: qty 10

## 2019-11-29 MED ORDER — ROCURONIUM BROMIDE 50 MG/5ML IV SOSY
PREFILLED_SYRINGE | INTRAVENOUS | Status: DC | PRN
  Administered 2019-11-29: 50 mg via INTRAVENOUS
  Administered 2019-11-29: 20 mg via INTRAVENOUS

## 2019-11-29 MED ORDER — MORPHINE SULFATE 2 MG/ML IV SOLN
INTRAVENOUS | Status: AC
  Filled 2019-11-29: qty 30

## 2019-11-29 MED ORDER — PIPERACILLIN-TAZOBACTAM 3.375 G IVPB
3.3750 g | Freq: Three times a day (TID) | INTRAVENOUS | Status: DC
  Administered 2019-11-29 – 2019-12-10 (×33): 3.375 g via INTRAVENOUS
  Filled 2019-11-29 (×46): qty 50

## 2019-11-29 MED ORDER — SUGAMMADEX SODIUM 200 MG/2ML IV SOLN
INTRAVENOUS | Status: DC | PRN
  Administered 2019-11-29: 130 mg via INTRAVENOUS

## 2019-11-29 MED ORDER — KETAMINE HCL 10 MG/ML IJ SOLN
INTRAMUSCULAR | Status: DC | PRN
  Administered 2019-11-29: 20 mg via INTRAVENOUS
  Administered 2019-11-29 (×2): 15 mg via INTRAVENOUS

## 2019-11-29 MED ORDER — MORPHINE SULFATE 2 MG/ML IV SOLN: INTRAVENOUS | Status: DC

## 2019-11-29 MED ORDER — HYDROMORPHONE HCL 1 MG/ML IJ SOLN
INTRAMUSCULAR | Status: AC
  Filled 2019-11-29: qty 1

## 2019-11-29 MED ORDER — ACETAMINOPHEN 10 MG/ML IV SOLN
1000.0000 mg | Freq: Four times a day (QID) | INTRAVENOUS | Status: AC
  Administered 2019-11-29 – 2019-11-30 (×3): 1000 mg via INTRAVENOUS
  Filled 2019-11-29 (×3): qty 100

## 2019-11-29 MED ORDER — MIDAZOLAM HCL 5 MG/5ML IJ SOLN
INTRAMUSCULAR | Status: DC | PRN
  Administered 2019-11-29: 2 mg via INTRAVENOUS

## 2019-11-29 MED ORDER — PROPOFOL 10 MG/ML IV BOLUS
INTRAVENOUS | Status: DC | PRN
  Administered 2019-11-29: 130 mg via INTRAVENOUS

## 2019-11-29 MED ORDER — INSULIN ASPART 100 UNIT/ML ~~LOC~~ SOLN
0.0000 [IU] | SUBCUTANEOUS | Status: DC
  Administered 2019-12-02 – 2019-12-03 (×3): 1 [IU] via SUBCUTANEOUS
  Filled 2019-11-29: qty 0.09

## 2019-11-29 MED ORDER — FENTANYL CITRATE (PF) 250 MCG/5ML IJ SOLN
INTRAMUSCULAR | Status: DC | PRN
  Administered 2019-11-29: 50 ug via INTRAVENOUS
  Administered 2019-11-29: 100 ug via INTRAVENOUS
  Administered 2019-11-29: 150 ug via INTRAVENOUS
  Administered 2019-11-29 (×2): 50 ug via INTRAVENOUS

## 2019-11-29 MED ORDER — METHOCARBAMOL 1000 MG/10ML IJ SOLN
500.0000 mg | Freq: Three times a day (TID) | INTRAVENOUS | Status: DC
  Administered 2019-11-29 – 2019-12-06 (×22): 500 mg via INTRAVENOUS
  Filled 2019-11-29 (×2): qty 5
  Filled 2019-11-29: qty 500
  Filled 2019-11-29 (×2): qty 5
  Filled 2019-11-29: qty 500
  Filled 2019-11-29 (×7): qty 5
  Filled 2019-11-29: qty 500
  Filled 2019-11-29 (×11): qty 5
  Filled 2019-11-29: qty 500
  Filled 2019-11-29: qty 5

## 2019-11-29 MED ORDER — HYDROMORPHONE 1 MG/ML IV SOLN
INTRAVENOUS | Status: DC
  Administered 2019-11-29: 30 mg via INTRAVENOUS
  Administered 2019-11-29: 3 mg via INTRAVENOUS
  Administered 2019-11-30: 1.5 mg via INTRAVENOUS
  Administered 2019-11-30: 2.4 mg via INTRAVENOUS
  Administered 2019-11-30: 2.1 mg via INTRAVENOUS
  Administered 2019-11-30: 1.5 mg via INTRAVENOUS
  Administered 2019-11-30: 1.2 mg via INTRAVENOUS
  Administered 2019-11-30: 1.8 mg via INTRAVENOUS
  Administered 2019-12-01: 1.2 mg via INTRAVENOUS
  Administered 2019-12-01: 2.1 mg via INTRAVENOUS
  Administered 2019-12-01: 2.7 mg via INTRAVENOUS
  Administered 2019-12-01: 1.2 mg via INTRAVENOUS
  Administered 2019-12-01: 2.1 mg via INTRAVENOUS
  Administered 2019-12-01: 0.9 mg via INTRAVENOUS
  Administered 2019-12-01: 30 mg via INTRAVENOUS
  Administered 2019-12-02: 1.2 mg via INTRAVENOUS
  Administered 2019-12-02: 0 mg via INTRAVENOUS
  Administered 2019-12-02: 1.2 mg via INTRAVENOUS
  Administered 2019-12-02: 0.3 mg via INTRAVENOUS
  Administered 2019-12-02 (×2): 0.6 mg via INTRAVENOUS
  Administered 2019-12-02: 0.9 mg via INTRAVENOUS
  Administered 2019-12-03: 1.2 mg via INTRAVENOUS
  Administered 2019-12-03 (×2): 0.9 mg via INTRAVENOUS
  Administered 2019-12-03: 0 mg via INTRAVENOUS
  Administered 2019-12-03 – 2019-12-04 (×2): 0.9 mg via INTRAVENOUS
  Administered 2019-12-04 (×2): 0 mg via INTRAVENOUS
  Administered 2019-12-04: 1.2 mg via INTRAVENOUS
  Administered 2019-12-04: 0.3 mg via INTRAVENOUS
  Administered 2019-12-04 – 2019-12-05 (×4): 0 mg via INTRAVENOUS
  Filled 2019-11-29 (×3): qty 30

## 2019-11-29 MED ORDER — FENTANYL CITRATE (PF) 100 MCG/2ML IJ SOLN: 25.0000 ug | INTRAMUSCULAR | Status: DC | PRN

## 2019-11-29 MED ORDER — MIDAZOLAM HCL 2 MG/2ML IJ SOLN
INTRAMUSCULAR | Status: AC
  Filled 2019-11-29: qty 2

## 2019-11-29 MED ORDER — DEXAMETHASONE SODIUM PHOSPHATE 10 MG/ML IJ SOLN
INTRAMUSCULAR | Status: DC | PRN
  Administered 2019-11-29: 10 mg via INTRAVENOUS

## 2019-11-29 MED ORDER — SODIUM CHLORIDE 0.9% IV SOLUTION: Freq: Once | INTRAVENOUS | Status: DC

## 2019-11-29 MED ORDER — MUPIROCIN 2 % EX OINT
1.0000 "application " | TOPICAL_OINTMENT | Freq: Two times a day (BID) | CUTANEOUS | Status: DC
  Administered 2019-11-29 – 2019-12-01 (×2): 1 via NASAL
  Filled 2019-11-29: qty 22

## 2019-11-29 MED ORDER — SUCCINYLCHOLINE CHLORIDE 200 MG/10ML IV SOSY
PREFILLED_SYRINGE | INTRAVENOUS | Status: DC | PRN
  Administered 2019-11-29: 140 mg via INTRAVENOUS

## 2019-11-29 MED ORDER — ONDANSETRON HCL 4 MG/2ML IJ SOLN
INTRAMUSCULAR | Status: DC | PRN
  Administered 2019-11-29: 4 mg via INTRAVENOUS

## 2019-11-29 MED ORDER — NALOXONE HCL 0.4 MG/ML IJ SOLN: 0.4000 mg | INTRAMUSCULAR | Status: DC | PRN

## 2019-11-29 MED ORDER — 0.9 % SODIUM CHLORIDE (POUR BTL) OPTIME
TOPICAL | Status: DC | PRN
  Administered 2019-11-29 (×3): 1000 mL

## 2019-11-29 MED ORDER — DEXMEDETOMIDINE HCL 200 MCG/2ML IV SOLN
INTRAVENOUS | Status: DC | PRN
  Administered 2019-11-29 (×5): 8 ug via INTRAVENOUS

## 2019-11-29 MED ORDER — LIDOCAINE 2% (20 MG/ML) 5 ML SYRINGE
INTRAMUSCULAR | Status: DC | PRN
  Administered 2019-11-29: 60 mg via INTRAVENOUS

## 2019-11-29 MED ORDER — LIDOCAINE 2% (20 MG/ML) 5 ML SYRINGE
INTRAMUSCULAR | Status: AC
  Filled 2019-11-29: qty 5

## 2019-11-29 MED ORDER — ALBUMIN HUMAN 5 % IV SOLN: INTRAVENOUS | Status: DC | PRN

## 2019-11-29 MED ORDER — PROMETHAZINE HCL 25 MG/ML IJ SOLN: 6.2500 mg | INTRAMUSCULAR | Status: DC | PRN

## 2019-11-29 SURGICAL SUPPLY — 48 items
BLADE CLIPPER SURG (BLADE) ×3 IMPLANT
BNDG GAUZE ELAST 4 BULKY (GAUZE/BANDAGES/DRESSINGS) ×3 IMPLANT
CANISTER SUCT 3000ML PPV (MISCELLANEOUS) ×3 IMPLANT
COVER SURGICAL LIGHT HANDLE (MISCELLANEOUS) ×3 IMPLANT
COVER WAND RF STERILE (DRAPES) ×3 IMPLANT
DRAIN CHANNEL 19F RND (DRAIN) ×3 IMPLANT
DRAPE LAPAROSCOPIC ABDOMINAL (DRAPES) ×3 IMPLANT
DRAPE WARM FLUID 44X44 (DRAPES) ×3 IMPLANT
DRSG OPSITE POSTOP 4X10 (GAUZE/BANDAGES/DRESSINGS) IMPLANT
DRSG OPSITE POSTOP 4X8 (GAUZE/BANDAGES/DRESSINGS) IMPLANT
ELECT BLADE 4.0 EZ CLEAN MEGAD (MISCELLANEOUS) ×3
ELECT BLADE 6.5 EXT (BLADE) ×3 IMPLANT
ELECT CAUTERY BLADE 6.4 (BLADE) ×3 IMPLANT
ELECT REM PT RETURN 9FT ADLT (ELECTROSURGICAL) ×3
ELECTRODE BLDE 4.0 EZ CLN MEGD (MISCELLANEOUS) ×1 IMPLANT
ELECTRODE REM PT RTRN 9FT ADLT (ELECTROSURGICAL) ×1 IMPLANT
EVACUATOR SILICONE 100CC (DRAIN) ×3 IMPLANT
GLOVE SURG SIGNA 7.5 PF LTX (GLOVE) ×3 IMPLANT
GOWN STRL REUS W/ TWL LRG LVL3 (GOWN DISPOSABLE) ×3 IMPLANT
GOWN STRL REUS W/ TWL XL LVL3 (GOWN DISPOSABLE) ×1 IMPLANT
GOWN STRL REUS W/TWL LRG LVL3 (GOWN DISPOSABLE) ×6
GOWN STRL REUS W/TWL XL LVL3 (GOWN DISPOSABLE) ×2
HANDLE SUCTION POOLE (INSTRUMENTS) ×1 IMPLANT
HEMOSTAT SNOW SURGICEL 2X4 (HEMOSTASIS) ×3 IMPLANT
KIT BASIN OR (CUSTOM PROCEDURE TRAY) ×3 IMPLANT
KIT TURNOVER KIT B (KITS) ×3 IMPLANT
LIGASURE IMPACT 36 18CM CVD LR (INSTRUMENTS) ×3 IMPLANT
NS IRRIG 1000ML POUR BTL (IV SOLUTION) ×9 IMPLANT
PACK GENERAL/GYN (CUSTOM PROCEDURE TRAY) ×3 IMPLANT
PAD ARMBOARD 7.5X6 YLW CONV (MISCELLANEOUS) ×3 IMPLANT
PENCIL SMOKE EVACUATOR (MISCELLANEOUS) ×3 IMPLANT
RELOAD PROXIMATE 75MM BLUE (ENDOMECHANICALS) ×9 IMPLANT
SPECIMEN JAR LARGE (MISCELLANEOUS) IMPLANT
SPONGE LAP 18X18 RF (DISPOSABLE) ×9 IMPLANT
STAPLER GUN LINEAR PROX 60 (STAPLE) ×3 IMPLANT
STAPLER PROXIMATE 75MM BLUE (STAPLE) ×3 IMPLANT
STAPLER VISISTAT 35W (STAPLE) ×3 IMPLANT
SUCTION POOLE HANDLE (INSTRUMENTS) ×3
SUT ETHILON 2 0 FS 18 (SUTURE) ×3 IMPLANT
SUT PDS AB 1 TP1 96 (SUTURE) ×6 IMPLANT
SUT SILK 2 0 SH CR/8 (SUTURE) ×6 IMPLANT
SUT SILK 2 0 TIES 10X30 (SUTURE) ×3 IMPLANT
SUT SILK 3 0 SH CR/8 (SUTURE) ×3 IMPLANT
SUT SILK 3 0 TIES 10X30 (SUTURE) ×3 IMPLANT
SUT VIC AB 3-0 SH 18 (SUTURE) IMPLANT
TOWEL GREEN STERILE (TOWEL DISPOSABLE) ×3 IMPLANT
TOWEL GREEN STERILE FF (TOWEL DISPOSABLE) ×3 IMPLANT
TRAY FOLEY MTR SLVR 16FR STAT (SET/KITS/TRAYS/PACK) ×3 IMPLANT

## 2019-11-29 NOTE — Progress Notes (Signed)
Patient ID: Eric Ramthun., male   DOB: 1976/06/06, 44 y.o.   MRN: [id number]   Pre Procedure note for inpatients:   Eric Castillo. has been scheduled for Procedure(s): EXPLORATORY LAPAROTOMY (N/A) today. The various methods of treatment have been discussed with the patient. After consideration of the risks, benefits and treatment options the patient has consented to the planned procedure.   The patient has been seen and labs reviewed. There are no changes in the patient's condition to prevent proceeding with the planned procedure today.  Recent labs:  Lab Results  Component Value Date   WBC 8.9 11/29/2019   HGB 11.5 (L) 11/29/2019   HCT 35.0 (L) 11/29/2019   PLT 287 11/29/2019   GLUCOSE 127 (H) 11/29/2019   CHOL 167 12/27/2018   TRIG 75 12/27/2018   HDL 61 12/27/2018   LDLCALC 91 12/27/2018   ALT 16 11/28/2019   AST 12 (L) 11/28/2019   NA 136 11/29/2019   K 3.9 11/29/2019   CL 102 11/29/2019   CREATININE 0.79 11/29/2019   BUN 10 11/29/2019   CO2 23 11/29/2019   TSH 0.481 12/22/2018   HGBA1C 6.0 (H) 05/03/2018    Coralie Keens, MD 11/29/2019 8:12 AM

## 2019-11-29 NOTE — Progress Notes (Signed)
PHARMACY - TOTAL PARENTERAL NUTRITION CONSULT NOTE   Indication: Prolonged ileus  Patient Measurements: Height: 6' (182.9 cm) Weight: 134 lb 14.7 oz (61.2 kg) IBW/kg (Calculated) : 77.6 TPN AdjBW (KG): 61.2 Body mass index is 18.3 kg/m.  Assessment: 44 year old gentleman with history of Crohn's disease, cannabis use and asthma presenting with several days of abdominal pain.  Last bowel movement was 2 days PTA. 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  Anticipate a prolonged post-op ileus.  Plan:  TPN consult received after deadline, will plan to start TPN on 11/30/19. Order for PICC line placed by PA, hopefully to be inserted today Order for labs, consult to dietician and SSI orders placed in preparation for TPN.  Thank you for allowing Pharmacy to be a part of the care of this patient.  Alanda Slim, PharmD, [facility] Clinical Pharmacist Please see AMION for all Pharmacists' Contact Phone Numbers 11/29/2019, 12:25 PM

## 2019-11-29 NOTE — Plan of Care (Signed)
Plan of care reviewed with pt at bedside.  NG tube @ 58cm in place upon arrival back to unit from PACU.  JP draining serosangous fluid.  Foley in place.  PCA changed to dilaudid and infusing per orders. Pt pain under control with pump.  Gauze over abdomen, no drainage noted.  NPO.  Bed alarms on. Call bell in reach. Pt stable at this time.  Problem: Education: Goal: Knowledge of General Education information will improve Description: Including pain rating scale, medication(s)/side effects and non-pharmacologic comfort measures Outcome: Progressing   Problem: Health Behavior/Discharge Planning: Goal: Ability to manage health-related needs will improve Outcome: Progressing   Problem: Clinical Measurements: Goal: Ability to maintain clinical measurements within normal limits will improve Outcome: Progressing Goal: Will remain free from infection Outcome: Progressing Goal: Diagnostic test results will improve Outcome: Progressing Goal: Respiratory complications will improve Outcome: Progressing Goal: Cardiovascular complication will be avoided Outcome: Progressing   Problem: Activity: Goal: Risk for activity intolerance will decrease Outcome: Progressing   Problem: Nutrition: Goal: Adequate nutrition will be maintained Outcome: Progressing   Problem: Coping: Goal: Level of anxiety will decrease Outcome: Progressing   Problem: Elimination: Goal: Will not experience complications related to bowel motility Outcome: Progressing Goal: Will not experience complications related to urinary retention Outcome: Progressing   Problem: Pain Managment: Goal: General experience of comfort will improve Outcome: Progressing   Problem: Safety: Goal: Ability to remain free from injury will improve Outcome: Progressing   Problem: Skin Integrity: Goal: Risk for impaired skin integrity will decrease Outcome: Progressing

## 2019-11-29 NOTE — Op Note (Signed)
EXPLORATORY LAPAROTOMY, Ileocecetomy, Appendectomy  Procedure Note  Eric Castillo. 11/29/2019   Pre-op Diagnosis: CROHN'S DISEASE WITH SMALL BOWEL STRICTURE AND FISTULA     Post-op Diagnosis: SAME  Procedure(s): EXPLORATORY LAPAROTOMY ILEOCECECTOMY  APPENDCECTOMY  Surgeon(s): Coralie Keens, MD  Anesthesia: General  Staff:  Circulator: Marliss Czar, RN Physician Assistant: Wellington Hampshire, PA-C Scrub Person: Christeen Louden Houseworth, CST Circulator Assistant: Cyd Silence, RN  Estimated Blood Loss: 350CC               Specimens: sent to path  Indications: This is a 44 year old gentleman with Crohn's disease who has had a recent flare and has failed to respond to medical management.  He has been seen on CT scan to have significant inflammation of his distal ileum with stricturing and a loop of small bowel in the pelvis that is fistulized to a second loop.  Given his failure to improvement, the decision was made to proceed to the operating room for an exploratory laparotomy and probable bowel resection  Findings: The patient was found to have extensively dilated small bowel.  There is mild amount of fibrinous exudate at the distal ileum.  The distal ileum was fixated in the pelvis with multiple loops there were fistulous together.  The cecum was normal.  Approximately 8 inches of small bowel was excised with the cecum and appendix.  The remaining small bowel was dilated but did not appear to have stricturing.  Procedure: Patient was brought to operating identifies correct patient.  He was placed upon the operating table general anesthesia was induced.  A lower midline incision was then created with a scalpel.  I carried this down through the fascia and peritoneum with cautery.  The small bowel was eviscerated.  It was found to be extremely dilated.  There were loops of small bowel going into the pelvis and then coming out fixed in the retroperitoneum to the cecum.  The cecum  itself appeared normal.  With extensive finger fracturing, I was finally able to free up the loops of small bowel from the pelvis which was quite woody and inflamed.  Once this was accomplished I was able to eviscerate small bowel.  Again there is extensive thickening of the bowel wall with fistula formation between loops all the way to just before the ileocecal valve.  Again, the cecum itself appeared normal.  I transected the small bowel proximal to this area with a GIA-75 stapler.  I then used a GIA-75 stapler to transect the cecum at the ileocecal valve and appendix.  The mesentery was then taken down with LigaSure and 2-0 silk sutures.  The specimen was sent to pathology for evaluation.  We again evaluated the more proximal small bowel and it was extensively dilated.  Prior to transection proximally we did suction out a large amount of enteric contents from the small bowel.  The distal small bowel suture line appeared ischemic so I took several more centimeters small bowel with a GIA-75 stapler.  At this point we decided to go ahead and proceed with an anastomosis.  I reapproximated the distal small bowel to the right colon along the tenia with interrupted silk sutures.  I then performed a colotomy and enterotomy and created a side-to-side anastomosis with a GIA-75 stapler.  The opening was closed with a TX 60 stapler.  I then reinforced the entire staple line with silk sutures.  The anastomosis appeared widely patent and well perfused.  We then reevaluate the pelvis.  Hemostasis  appeared to be achieved with the cautery and several pieces of surgical snow.  Pressure was held as well.  We irrigated the abdomen extensively with normal saline.  We reevaluate the pelvis and hemostasis appeared to be achieved.  I made a separate skin incision and placed a 19 [place] Blake drain into the pelvis.  This was sutured in place with a nylon suture and placed to bulb suctioning.  The patient's midline fascia was then closed  with a running #1 looped PDS suture.  The skin was left open and packed with wet-to-dry saline soaked Kerlix.  Dry gauze and tape were placed over these.  The patient tolerated the procedure well.  All the counts were correct at the end of the procedure.  The patient was then extubated in the operating room and taken in a stable condition to the recovery room.          Coralie Keens   Date: 11/29/2019  Time: 11:35 AM

## 2019-11-29 NOTE — Anesthesia Postprocedure Evaluation (Signed)
Anesthesia Post Note  Patient: Eric Castillo.  Procedure(s) Performed: EXPLORATORY LAPAROTOMY (N/A Abdomen) Ileocecetomy (N/A Abdomen) Appendectomy (N/A Abdomen)     Patient location during evaluation: PACU Anesthesia Type: General Level of consciousness: awake and alert Pain management: pain level controlled Vital Signs Assessment: post-procedure vital signs reviewed and stable Respiratory status: spontaneous breathing, nonlabored ventilation, respiratory function stable and patient connected to nasal cannula oxygen Cardiovascular status: blood pressure returned to baseline and stable Postop Assessment: no apparent nausea or vomiting Anesthetic complications: no    Last Vitals:  Vitals:   11/29/19 1325 11/29/19 1340  BP: (!) 160/85 (!) 157/88  Pulse: 82 81  Resp: 13 17  Temp:    SpO2: 99% 99%    Last Pain:  Vitals:   11/29/19 1310  TempSrc:   PainSc: 10-Worst pain ever                 Tiajuana Amass

## 2019-11-29 NOTE — Progress Notes (Signed)
Family Medicine Teaching Service Daily Progress Note Intern Pager: [phone]  Patient name: Eric Castillo. Medical record number: [id number] Date of birth: 22-Jun-1976 Age: 44 y.o. Gender: male  Primary Care Provider: Wilber Oliphant, MD Consultants: General Surgery, GI Code Status: Full  Pt Overview and Major Events to Date:  11/27/19- admitted, SBO  Assessment and Plan: Eric Castillo. is a 44 y.o. male presenting with 2 to 3-day history of intermittent severe abdominal pain, constipation, vomiting.  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  Small bowel obstruction/abdominal pain/nausea Still no flatus, unable to tolerate PO. Pain well controlled on morphine 29m q3h PRN. VSS. Surgery to go take for resection today. 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 Continue Solu-Medrol 40 mg IV. Will follow up surgery recommendations after surgery. Depending on how patient tolerates procedures, diet will be CLD vs NPO w/ sips and chips. Plan to manage pain and continue steroids. Helping connect patient to resources to set up medicaid. Heparin held yesterday afternoon for procedure today, can restart lovenox either tonight or tomorrow depending on procedure. If no DVT ppx tonight, will add SCDs. -Vital signs per MedSurg routine -GI following, appreciate the recommendations -General surgery following, appreciate recommendations -Solu-Medrol 40 mg IV -N.p.o. for now -Morphine 2 mg every 3 hours for pain control - Zofran 4 mg every 8 as needed  -Chewing gum as needed -Frequent ambulation -Normal saline at 125 mL/h -SCDs for DVT ppx   Crohn's disease/enteroenteric fistula Recently started on 40 mg daily prednisone in early March.  Decreased to 30 mg on 3/27 per his taper.  Has not been able to get any suppressive medications as he has not had any  insurance and there unaffordable for him.  GI following, appreciate their recommendations.  Going for surgery today. Resources made available to assist with medications after surgery. -Solu-Medrol 40 mg IV -Gastroenterology, appreciate their recommendations -Gen surg, appreciate their recommendations -TOC to assist with PCP set up  History of childhood asthma Patient states he has not needed albuterol in a very very long time.  He states he does not take this anymore.  Will consider a resolved problem, albuterol as needed if develops symptoms  FEN/GI: NPO w/ sips/ice chips PPx: SCDs  Disposition: Surgery today, likely to home pending stabilization, able to take PO after surgery  Subjective:  Patient not passing flatus, no bm, pain controlled. Looking forward to surgery today.  Objective: Temp:  [98.4 F (36.9 C)-98.6 F (37 C)] 98.5 F (36.9 C) (03/31 0526) Pulse Rate:  [66-80] 80 (03/31 0526) Resp:  [16-17] 16 (03/31 0526) BP: (117-127)/(65-69) 127/69 (03/31 0526) SpO2:  [99 %-100 %] 100 % (03/31 0526) Physical Exam: General: WNWD middle-aged AA man resting in bed, NAD Cardiovascular: RRR, no m/r/g Respiratory: CTAB, no increased WOB, no wheezes/rales/rhonchi Abdomen: tense, tender to palpation diffusely, mildly distended, rare bowel sounds Extremities: warm, dry, no edema  Laboratory: Recent Labs  Lab 11/27/19 0258 11/28/19 0422 11/29/19 0220  WBC 13.3* 9.8 8.9  HGB 13.8 12.0* 11.5*  HCT 42.9 36.9* 35.0*  PLT 370 281 287   Recent Labs  Lab 11/27/19 0258 11/28/19 0422 11/29/19 0220  NA 140 137 136  K 3.5 4.1 3.9  CL 97* 102 102  CO2 29 24 23   BUN 16 13 10   CREATININE 0.91 0.82 0.79  CALCIUM 9.4 9.2 8.9  PROT  6.8 6.0*  --   BILITOT 0.8 0.9  --   ALKPHOS 60 57  --   ALT 22 16  --   AST 17 12*  --   GLUCOSE 99 94 127*   Imaging/Diagnostic Tests: No results found. Gladys Damme, MD 11/29/2019, 7:51 AM PGY-1, [place] Intern  pager: [phone], text pages welcome

## 2019-11-29 NOTE — Transfer of Care (Signed)
Immediate Anesthesia Transfer of Care Note  Patient: Eric Castillo.  Procedure(s) Performed: EXPLORATORY LAPAROTOMY (N/A Abdomen) Ileocecetomy (N/A Abdomen) Appendectomy (N/A Abdomen)  Patient Location: PACU  Anesthesia Type:General  Level of Consciousness: pateint uncooperative  Airway & Oxygen Therapy: Patient Spontanous Breathing and Patient connected to nasal cannula oxygen  Post-op Assessment: Report given to RN and Post -op Vital signs reviewed and stable  Post vital signs: Reviewed and stable  Last Vitals:  Vitals Value Taken Time  BP    Temp    Pulse    Resp    SpO2      Last Pain:  Vitals:   11/29/19 0915  TempSrc:   PainSc: 2       Patients Stated Pain Goal: 3 (64/68/03 2122)  Complications: No apparent anesthesia complications

## 2019-11-29 NOTE — Plan of Care (Signed)
  Problem: Education: Goal: Knowledge of General Education information will improve Description Including pain rating scale, medication(s)/side effects and non-pharmacologic comfort measures Outcome: Progressing   

## 2019-11-29 NOTE — Progress Notes (Signed)
Dr Ninfa Linden informed that Ancef 2 gm given on the unit prior to arriving in [facility].  Per MD, patient does not need additional antibotic for surgery.

## 2019-11-29 NOTE — Anesthesia Preprocedure Evaluation (Signed)
Anesthesia Evaluation  Patient identified by MRN, date of birth, ID band Patient awake    Reviewed: Allergy & Precautions, NPO status , Patient's Chart, lab work & pertinent test results  Airway Mallampati: II  TM Distance: >3 FB     Dental  (+) Dental Advisory Given   Pulmonary asthma , Current Smoker and Patient abstained from smoking.,    breath sounds clear to auscultation       Cardiovascular negative cardio ROS   Rhythm:Regular Rate:Normal     Neuro/Psych negative neurological ROS     GI/Hepatic negative GI ROS, Neg liver ROS,   Endo/Other  negative endocrine ROS  Renal/GU negative Renal ROS     Musculoskeletal   Abdominal   Peds  Hematology  (+) anemia ,   Anesthesia Other Findings   Reproductive/Obstetrics                             Lab Results  Component Value Date   WBC 8.9 11/29/2019   HGB 11.5 (L) 11/29/2019   HCT 35.0 (L) 11/29/2019   MCV 87.5 11/29/2019   PLT 287 11/29/2019   Lab Results  Component Value Date   CREATININE 0.79 11/29/2019   BUN 10 11/29/2019   NA 136 11/29/2019   K 3.9 11/29/2019   CL 102 11/29/2019   CO2 23 11/29/2019    Anesthesia Physical Anesthesia Plan  ASA: II  Anesthesia Plan: General   Post-op Pain Management:    Induction: Intravenous  PONV Risk Score and Plan: 1 and Ondansetron, Dexamethasone and Treatment may vary due to age or medical condition  Airway Management Planned: Oral ETT  Additional Equipment:   Intra-op Plan:   Post-operative Plan: Extubation in OR  Informed Consent: I have reviewed the patients History and Physical, chart, labs and discussed the procedure including the risks, benefits and alternatives for the proposed anesthesia with the patient or authorized representative who has indicated his/her understanding and acceptance.     Dental advisory given  Plan Discussed with: CRNA  Anesthesia Plan  Comments:         Anesthesia Quick Evaluation

## 2019-11-29 NOTE — Anesthesia Procedure Notes (Signed)
Procedure Name: Intubation Date/Time: 11/29/2019 10:09 AM Performed by: Griffin Dakin, CRNA Pre-anesthesia Checklist: Patient identified, Emergency Drugs available, Suction available and Patient being monitored Patient Re-evaluated:Patient Re-evaluated prior to induction Oxygen Delivery Method: Circle system utilized Preoxygenation: Pre-oxygenation with 100% oxygen Induction Type: IV induction and Rapid sequence Laryngoscope Size: Mac and 4 Grade View: Grade III Tube type: Oral Number of attempts: 1 Airway Equipment and Method: Stylet and Oral airway Placement Confirmation: ETT inserted through vocal cords under direct vision,  positive ETCO2 and breath sounds checked- equal and bilateral Secured at: 23 cm Tube secured with: Tape Dental Injury: Teeth and Oropharynx as per pre-operative assessment

## 2019-11-30 LAB — DIFFERENTIAL
Abs Immature Granulocytes: 0.08 10*3/uL — ABNORMAL HIGH (ref 0.00–0.07)
Basophils Absolute: 0 10*3/uL (ref 0.0–0.1)
Basophils Relative: 0 %
Eosinophils Absolute: 0 10*3/uL (ref 0.0–0.5)
Eosinophils Relative: 0 %
Immature Granulocytes: 1 %
Lymphocytes Relative: 3 %
Lymphs Abs: 0.4 10*3/uL — ABNORMAL LOW (ref 0.7–4.0)
Monocytes Absolute: 0.7 10*3/uL (ref 0.1–1.0)
Monocytes Relative: 6 %
Neutro Abs: 11.4 10*3/uL — ABNORMAL HIGH (ref 1.7–7.7)
Neutrophils Relative %: 90 %

## 2019-11-30 LAB — COMPREHENSIVE METABOLIC PANEL
ALT: 13 U/L (ref 0–44)
AST: 15 U/L (ref 15–41)
Albumin: 2.8 g/dL — ABNORMAL LOW (ref 3.5–5.0)
Alkaline Phosphatase: 53 U/L (ref 38–126)
Anion gap: 11 (ref 5–15)
BUN: 9 mg/dL (ref 6–20)
CO2: 26 mmol/L (ref 22–32)
Calcium: 9.2 mg/dL (ref 8.9–10.3)
Chloride: 98 mmol/L (ref 98–111)
Creatinine, Ser: 0.84 mg/dL (ref 0.61–1.24)
GFR calc Af Amer: >60 mL/min (ref >60)
GFR calc non Af Amer: >60 mL/min (ref >60)
Glucose, Bld: 116 mg/dL — ABNORMAL HIGH (ref 70–99)
Potassium: 4.3 mmol/L (ref 3.5–5.1)
Sodium: 135 mmol/L (ref 135–145)
Total Bilirubin: 0.6 mg/dL (ref 0.3–1.2)
Total Protein: 6 g/dL — ABNORMAL LOW (ref 6.5–8.1)

## 2019-11-30 LAB — GLUCOSE, CAPILLARY
Glucose-Capillary: 106 mg/dL — ABNORMAL HIGH (ref 70–99)
Glucose-Capillary: 115 mg/dL — ABNORMAL HIGH (ref 70–99)
Glucose-Capillary: 116 mg/dL — ABNORMAL HIGH (ref 70–99)
Glucose-Capillary: 83 mg/dL (ref 70–99)
Glucose-Capillary: 87 mg/dL (ref 70–99)
Glucose-Capillary: 96 mg/dL (ref 70–99)
Glucose-Capillary: 98 mg/dL (ref 70–99)

## 2019-11-30 LAB — PREALBUMIN: Prealbumin: 15 mg/dL — ABNORMAL LOW (ref 18–38)

## 2019-11-30 LAB — CBC
HCT: 34.8 % — ABNORMAL LOW (ref 39.0–52.0)
Hemoglobin: 11 g/dL — ABNORMAL LOW (ref 13.0–17.0)
MCH: 27.7 pg (ref 26.0–34.0)
MCHC: 31.6 g/dL (ref 30.0–36.0)
MCV: 87.7 fL (ref 80.0–100.0)
Platelets: 290 10*3/uL (ref 150–400)
RBC: 3.97 MIL/uL — ABNORMAL LOW (ref 4.22–5.81)
RDW: 18.8 % — ABNORMAL HIGH (ref 11.5–15.5)
WBC: 12.5 10*3/uL — ABNORMAL HIGH (ref 4.0–10.5)
nRBC: 0 % (ref 0.0–0.2)

## 2019-11-30 LAB — MAGNESIUM: Magnesium: 1.8 mg/dL (ref 1.7–2.4)

## 2019-11-30 LAB — PHOSPHORUS: Phosphorus: 4.4 mg/dL (ref 2.5–4.6)

## 2019-11-30 LAB — TRIGLYCERIDES: Triglycerides: 81 mg/dL (ref <150)

## 2019-11-30 MED ORDER — SODIUM CHLORIDE 0.9% FLUSH: 10.0000 mL | INTRAVENOUS | Status: DC | PRN

## 2019-11-30 MED ORDER — ACETAMINOPHEN 10 MG/ML IV SOLN
1000.0000 mg | Freq: Four times a day (QID) | INTRAVENOUS | Status: AC
  Administered 2019-11-30 – 2019-12-01 (×4): 1000 mg via INTRAVENOUS
  Filled 2019-11-30 (×4): qty 100

## 2019-11-30 MED ORDER — ENOXAPARIN SODIUM 40 MG/0.4ML ~~LOC~~ SOLN
40.0000 mg | SUBCUTANEOUS | Status: AC
  Administered 2019-11-30 – 2019-12-05 (×6): 40 mg via SUBCUTANEOUS
  Filled 2019-11-30 (×6): qty 0.4

## 2019-11-30 MED ORDER — ONDANSETRON HCL 4 MG PO TABS: 4.0000 mg | ORAL_TABLET | Freq: Four times a day (QID) | ORAL | Status: DC | PRN

## 2019-11-30 MED ORDER — SODIUM CHLORIDE 0.9% FLUSH
10.0000 mL | Freq: Two times a day (BID) | INTRAVENOUS | Status: DC
  Administered 2019-11-30: 20 mL
  Administered 2019-12-06 – 2019-12-10 (×7): 10 mL

## 2019-11-30 MED ORDER — ONDANSETRON HCL 4 MG/2ML IJ SOLN: 4.0000 mg | Freq: Four times a day (QID) | INTRAMUSCULAR | Status: DC | PRN

## 2019-11-30 MED ORDER — SODIUM CHLORIDE 0.9 % IV SOLN: INTRAVENOUS | Status: AC

## 2019-11-30 MED ORDER — LIP MEDEX EX OINT
1.0000 "application " | TOPICAL_OINTMENT | CUTANEOUS | Status: DC | PRN
  Filled 2019-11-30: qty 7

## 2019-11-30 MED ORDER — TRAVASOL 10 % IV SOLN
INTRAVENOUS | Status: AC
  Filled 2019-11-30: qty 399.36

## 2019-11-30 MED ORDER — MAGNESIUM SULFATE 2 GM/50ML IV SOLN
2.0000 g | Freq: Once | INTRAVENOUS | Status: AC
  Administered 2019-11-30: 11:00:00 2 g via INTRAVENOUS
  Filled 2019-11-30: qty 50

## 2019-11-30 NOTE — Progress Notes (Signed)
Family Medicine Teaching Service Daily Progress Note Intern Pager: [phone]  Patient name: Eric Castillo. Medical record number: [id number] Date of birth: May 28, 1976 Age: 44 y.o. 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  Small bowel obstruction/abdominal pain/nausea POD#1 Exploratoray Laparotomy/Ileocecectomy/Appendecectomy for Chron's Disease with Small Bowel stricture and fistula.  Pain controlled with PCA pump. He reports wanting to get out of bed today.  NG tube to continuous suction.  Foley catheter in place and making good output.  He is not passing any gas but is belching.   -Surgery following, appreciate recommendations -GI following appreciate recommendations. -PICC line placement for TPN nutrition -NPO -Steroids discontinued by surgery. -vital signs per floor -continue chewing gum as needed-OOB -PT/OT evaluation -NS 156m/hr  Crohn's disease/enteroenteric fistula Steroids discontinued by surgery.   -GI following appreciate recommendations -s/p ex lap/ileocecetomy/appendecectomy  History of childhood asthma -continue to monitor  FEN/GI: -NPO w/ sips/ice chips PPx:  -SCDs  Disposition: Home when cleared by surgery and medically stable  Subjective:  No acute events overnight.  Denies any shortness of breath, chest pain.  Pain well controlled with PCA pump.  Has not passed gas but belching.  Objective: Temp:  [97 F (36.1 C)-98.7 F (37.1 C)] 97.7 F (36.5 C) (04/01 0324) Pulse Rate:  [73-89] 73 (04/01 0324) Resp:  [11-18] 12 (04/01 0428) BP: (141-161)/(73-92) 145/87 (04/01 0324) SpO2:  [96 %-100 %] 99 % (04/01  0428) Weight:  [61.2 kg] 61.2 kg (03/31 0915) Physical Exam:  General: Alert and oriented, no apparent distress  Cardiovascular: RRR with no murmurs noted Respiratory: CTA bilaterally  Gastrointestinal: Bowel sounds present. No abdominal pain. JP drain insitu with mod amount serous drainage, surgical site covered with abdominal dressing no drainage noted. MSK: Upper extremity strength 5/5 bilaterally, Lower extremity strength 5/5 bilaterally   Laboratory: Recent Labs  Lab 11/29/19 0220 11/29/19 1431 11/30/19 0533  WBC 8.9 17.5* 12.5*  HGB 11.5* 12.6* 11.0*  HCT 35.0* 39.3 34.8*  PLT 287 306 290   Recent Labs  Lab 11/27/19 0258 11/27/19 0258 11/28/19 0422 11/29/19 0220 11/30/19 0533  NA 140   < > 137 136 135  K 3.5   < > 4.1 3.9 4.3  CL 97*   < > 102 102 98  CO2 29   < > 24 23 26   BUN 16   < > 13 10 9   CREATININE 0.91   < > 0.82 0.79 0.84  CALCIUM 9.4   < > 9.2 8.9 9.2  PROT 6.8  --  6.0*  --  6.0*  BILITOT 0.8  --  0.9  --  0.6  ALKPHOS 60  --  57  --  53  ALT 22  --  16  --  13  AST 17  --  12*  --  15  GLUCOSE 99   < > 94 127* 116*   < > = values in this interval not displayed.    Imaging/Diagnostic Tests:   WCarollee Leitz MD 11/30/2019, 6:59 AM PGY-1, [place] pager: [phone] text pages welcome

## 2019-11-30 NOTE — Progress Notes (Addendum)
Initial Nutrition Assessment  DOCUMENTATION CODES:   Underweight  INTERVENTION:   -TPN management per pharmacy -RD will follow for diet advancement and adjust supplement regimen as appropriate  NUTRITION DIAGNOSIS:   Increased nutrient needs related to post-op healing as evidenced by estimated needs.  GOAL:   Patient will meet greater than or equal to 90% of their needs  MONITOR:   Diet advancement, Labs, Weight trends, Skin, I & O's  REASON FOR ASSESSMENT:   Consult New TPN/TNA  ASSESSMENT:   Eric Castillo. is a 44 y.o. male presenting with 2 to 3-day history of intermittent severe abdominal pain, constipation, vomiting.  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  Pt admitted with partial SBO.   11/29/19- s/p Procedure(s): EXPLORATORY LAPAROTOMY ILEOCECECTOMY  APPENDCECTOMY 4/1- PICC placed, TPN initiated  Reviewed I/O's: +2.8 L x 24 hours and +5.3 L since admission  UOP: 1.1 L x 24 hours  NGT output: 200 ml x 24 hours  Drain output: 260 ml x 24 hours  Per general surgery notes, pt with small bowel stricture and fistula pre-operatively.   Case discussed with RN, who confirms that PICC was placed this AM and plans to start TPN today.   Spoke with pt at bedside, who was pleasant and in good spirits today. He reports fluctuating appetite PTA. Per his report, pt was experiencing intermittent crohn's flare ups; he describes he would feel sensations and pain that were associated with flare ups, which would trigger him to consume only liquids for 3-4 days (he shares he would go to ED if flare did not resolve after 3 days).   Pt suspects he has lost weight recently due to increased frequency of flare-ups, but is unsure. He shares his weight was stable during his last visit with Dr. Carlean Purl PTA. Per his report, he has always been slender and wt is stable at 140-145#. He thinks has lost some muscle mass, as his clothing has  been looser ("you know it's bad when even your underwear is loose'). Per wt hx, wt has been stable over the past year.   Discussed with pt rationale for NGT and NPO status. He was able to verbalize plan for TPN and use of PICC line. He had no further questions for this RD, but expressed appreciation for visit.   Per pharmacy note, plan to start TPN at 1800; will initiate ar 40 ml/hr, which will provides 960 kcals and 40 grams protein, meeting   Medications reviewed and include 0.9% sodium chloride infusion @ 125 ml/hr.   Lab Results  Component Value Date   HGBA1C 6.0 (H) 05/03/2018   PTA DM medications are none.   Labs reviewed: K, Mg, and Phos WDL. CBGS: 87-118 (inpatient orders for glycemic control are 0-9 units inuslin aspart every 4 hours).   NUTRITION - FOCUSED PHYSICAL EXAM:    Most Recent Value  Orbital Region  No depletion  Upper Arm Region  No depletion  Thoracic and Lumbar Region  No depletion  Buccal Region  No depletion  Temple Region  No depletion  Clavicle Bone Region  No depletion  Clavicle and Acromion Bone Region  No depletion  Scapular Bone Region  No depletion  Dorsal Hand  No depletion  Patellar Region  No depletion  Anterior Thigh Region  No depletion  Posterior Calf Region  No depletion  Edema (RD Assessment)  None  Hair  Reviewed  Eyes  Reviewed  Mouth  Reviewed  Skin  Reviewed  Nails  Reviewed       Diet Order:   Diet Order            Diet NPO time specified Except for: Ice Chips  Diet effective now              EDUCATION NEEDS:   Education needs have been addressed  Skin:  Skin Assessment: Skin Integrity Issues: Skin Integrity Issues:: Incisions Incisions: closed abdomen  Last BM:  11/25/19  Height:   Ht Readings from Last 1 Encounters:  11/29/19 6' (1.829 m)    Weight:   Wt Readings from Last 1 Encounters:  11/29/19 61.2 kg    Ideal Body Weight:  80.9 kg  BMI:  Body mass index is 18.3 kg/m.  Estimated Nutritional  Needs:   Kcal:  0156-1537  Protein:  120-135 grams  Fluid:  > 2.2 L    Loistine Chance, RD, LDN, [place] Registered Dietitian II Certified Diabetes Care and Education Specialist Please refer to [facility] for RD and/or RD on-call/weekend/after hours pager

## 2019-11-30 NOTE — Progress Notes (Signed)
Peripherally Inserted Central Catheter Placement  The IV Nurse has discussed with the patient and/or persons authorized to consent for the patient, the purpose of this procedure and the potential benefits and risks involved with this procedure.  The benefits include less needle sticks, lab draws from the catheter, and the patient may be discharged home with the catheter. Risks include, but not limited to, infection, bleeding, blood clot (thrombus formation), and puncture of an artery; nerve damage and irregular heartbeat and possibility to perform a PICC exchange if needed/ordered by physician.  Alternatives to this procedure were also discussed.  Bard Power PICC patient education guide, fact sheet on infection prevention and patient information card has been provided to patient /or left at bedside.    PICC Placement Documentation  PICC Double Lumen 11/30/19 PICC Right Brachial 42 cm 1 cm (Active)  Indication for Insertion or Continuance of Line Administration of hyperosmolar/irritating solutions (i.e. TPN, Vancomycin, etc.) 11/30/19 1000  Exposed Catheter (cm) 1 cm 11/30/19 1000  Site Assessment Clean;Dry;Intact 11/30/19 1000  Lumen #1 Status Flushed;Blood return noted 11/30/19 1000  Lumen #2 Status Flushed;Blood return noted 11/30/19 1000  Dressing Type Transparent 11/30/19 1000  Dressing Status Clean;Dry;Intact;Antimicrobial disc in place 11/30/19 1000  Dressing Change Due 12/07/19 11/30/19 1000       Jule Economy Horton 11/30/2019, 10:01 AM

## 2019-11-30 NOTE — Progress Notes (Signed)
[facility] Progress Note  1 Day Post-Op  Subjective: CC-  States that he is feeling 100% better than prior to surgery. Abdominal pain well controlled with PCA. Denies n/v. No flatus or BM.  Objective: Vital signs in last 24 hours: Temp:  [97 F (36.1 C)-98.7 F (37.1 C)] 97.7 F (36.5 C) (04/01 0324) Pulse Rate:  [73-89] 73 (04/01 0324) Resp:  [11-18] 12 (04/01 0428) BP: (141-161)/(73-92) 145/87 (04/01 0324) SpO2:  [96 %-100 %] 99 % (04/01 0428) Last BM Date: 11/25/19  Intake/Output from previous day: 03/31 0701 - 04/01 0700 In: 4697.3 [I.V.:3797.3; IV Piggyback:900] Out: 1860 [Urine:1100; Emesis/NG output:200; Drains:260; Blood:300] Intake/Output this shift: No intake/output data recorded.  PE: Gen:  Alert, NAD, pleasant HEENT: EOM's intact, pupils equal and round Card:  RRR Pulm:  CTAB, no W/R/R, rate and effort normal Abd: Soft, ND, appropriately tender, hypoactive BS, open midline incision beefy red with trace bloody oozing/ no surrounding erythema or purulent drainage, JP drain with serosanguinous fluid  Lab Results:  Recent Labs    11/29/19 1431 11/30/19 0533  WBC 17.5* 12.5*  HGB 12.6* 11.0*  HCT 39.3 34.8*  PLT 306 290   BMET Recent Labs    11/29/19 0220 11/30/19 0533  NA 136 135  K 3.9 4.3  CL 102 98  CO2 23 26  GLUCOSE 127* 116*  BUN 10 9  CREATININE 0.79 0.84  CALCIUM 8.9 9.2   PT/INR No results for input(s): LABPROT, INR in the last 72 hours. CMP     Component Value Date/Time   NA 135 11/30/2019 0533   K 4.3 11/30/2019 0533   CL 98 11/30/2019 0533   CO2 26 11/30/2019 0533   GLUCOSE 116 (H) 11/30/2019 0533   BUN 9 11/30/2019 0533   CREATININE 0.84 11/30/2019 0533   CALCIUM 9.2 11/30/2019 0533   PROT 6.0 (L) 11/30/2019 0533   ALBUMIN 2.8 (L) 11/30/2019 0533   AST 15 11/30/2019 0533   ALT 13 11/30/2019 0533   ALKPHOS 53 11/30/2019 0533   BILITOT 0.6 11/30/2019 0533   GFRNONAA >60 11/30/2019 0533   GFRAA >60  11/30/2019 0533   Lipase     Component Value Date/Time   LIPASE 25 11/27/2019 0258       Studies/Results: DG Abd 1 View  Result Date: 11/29/2019 CLINICAL DATA:  Nasogastric placement. EXAM: ABDOMEN - 1 VIEW COMPARISON:  None. FINDINGS: Nasogastric tube enters the stomach with its tip in the mid body. Other catheter overlies the pelvis. Bowel gas pattern unremarkable. IMPRESSION: Nasogastric tube tip in the body of the stomach. Electronically Signed   By: Nelson Chimes M.D.   On: 11/29/2019 12:47   [place] EKG SITE RITE  Result Date: 11/29/2019 If Site Rite image not attached, placement could not be confirmed due to current cardiac rhythm.   Anti-infectives: Anti-infectives (From admission, onward)   Start     Dose/Rate Route Frequency Ordered Stop   11/29/19 1500  piperacillin-tazobactam (ZOSYN) IVPB 3.375 g     3.375 g 12.5 mL/hr over 240 Minutes Intravenous Every 8 hours 11/29/19 1345     11/29/19 0600  ceFAZolin (ANCEF) IVPB 2g/100 mL premix     2 g 200 mL/hr over 30 Minutes Intravenous On call to O.R. 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 Ninfa Linden - POD#1 - surgical path pending - expect prolonged ileus, continue NPO/NGT to LIWS and await return in bowel function. Starting TPN for nutritional support while NPO - continue IV zosyn - continue JP drain and monitor output - mobilize, will ask PT/OT to see - continue scheduled IV tylenol and robaxin for pain control, dilaudid PCA  ID - zosyn 3/31>> FEN - IVF, NPO/NGT to LIWS, TPN VTE - SCDs, start lovenox and check CBC in AM Foley - d/c 4/1 Follow up - Dr. Ninfa Linden, GI   LOS: 3 days    Wellington [place], [facility]  Surgery 11/30/2019, 10:16 AM Please see Amion for pager number during day hours 7:00am-4:30pm

## 2019-11-30 NOTE — Evaluation (Signed)
Physical Therapy Evaluation Patient Details Name: Eric Castillo. MRN: [id number] DOB: 1976/02/05 Today's Date: 11/30/2019   History of Present Illness  Pt is 44 yo male presenting to ED with 2-3 day hx of abd/pain.  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 Pt is s/p exp lap, ileocecectomy, appendcectomy on 11/29/19 - note fascia close but skin left open with dressing.  Pt with hx of Crohn' disease.  Clinical Impression  Pt admitted with above diagnosis. Pt very pleasant and agreeable to PT, but does not feel like ambulation past EOB today due to extensive surgery and lines/leads.  He was able to demonstrate transfers with min guard and cues for technique for pain control.   Pt currently with functional limitations due to the deficits listed below (see PT Problem List). Pt will benefit from skilled PT to increase their independence and safety with mobility to allow discharge to the venue listed below.       Follow Up Recommendations No PT follow up    Equipment Recommendations  None recommended by PT    Recommendations for Other Services       Precautions / Restrictions Precautions Precautions: Fall Precaution Comments: NG tube, JP drain      Mobility  Bed Mobility Overal bed mobility: Needs Assistance Bed Mobility: Sit to Supine       Sit to supine: Min guard;HOB elevated   General bed mobility comments: cues for log roll technique; increased time  Transfers Overall transfer level: Needs assistance Equipment used: None Transfers: Sit to/from [facility] Sit to Stand: Supervision Stand pivot transfers: Supervision       General transfer comment: increased time  Ambulation/Gait Ambulation/Gait assistance: Supervision Gait Distance (Feet): 3 Feet Assistive device: None Gait Pattern/deviations: Decreased stride length;Shuffle Gait velocity: decreased   General Gait Details: steps to bed; limited due to pain, lines/leads  Stairs             Wheelchair Mobility    Modified Rankin (Stroke Patients Only)       Balance Overall balance assessment: Needs assistance Sitting-balance support: No upper extremity supported;Feet supported Sitting balance-Leahy Scale: Good     Standing balance support: No upper extremity supported;During functional activity Standing balance-Leahy Scale: Good                               Pertinent Vitals/Pain Pain Assessment: 0-10 Pain Score: 7  Pain Location: abdomen Pain Descriptors / Indicators: Discomfort;Sore Pain Intervention(s): Limited activity within patient's tolerance;Monitored during session;Relaxation;Repositioned    Home Living Family/patient expects to be discharged to:: Private residence Living Arrangements: Spouse/significant other Available Help at Discharge: Family;Available PRN/intermittently Type of Home: Apartment Home Access: Level entry     Home Layout: One level Home Equipment: None      Prior Function Level of Independence: Independent         Comments: Works in Multimedia programmer        Extremity/Trunk Assessment   Upper Extremity Assessment Upper Extremity Assessment: Overall WFL for tasks assessed    Lower Extremity Assessment Lower Extremity Assessment: Overall WFL for tasks assessed    Cervical / Trunk Assessment Cervical / Trunk Assessment: Normal  Communication   Communication: No difficulties  Cognition Arousal/Alertness: Awake/alert Behavior During Therapy: WFL for tasks assessed/performed Overall Cognitive Status: Within Functional Limits for tasks assessed  General Comments General comments (skin integrity, edema, etc.): Pt agreeable to PT but reports first day post op he just wants to stay at [facility] and progress tomorrow    Exercises     Assessment/Plan    PT Assessment Patient needs continued PT services  PT Problem List  Decreased strength;Decreased mobility;Decreased activity tolerance;Decreased balance;Decreased knowledge of use of DME       PT Treatment Interventions DME instruction;Therapeutic activities;Gait training;Therapeutic exercise;Patient/family education;Stair training;Balance training;Functional mobility training    PT Goals (Current goals can be found in the Care Plan section)  Acute Rehab PT Goals Patient Stated Goal: decrease pain; return to normal mobility PT Goal Formulation: With patient Time For Goal Achievement: 12/14/19 Potential to Achieve Goals: Good    Frequency Min 3X/week   Barriers to discharge        Co-evaluation               AM-PAC PT "6 Clicks" Mobility  Outcome Measure Help needed turning from your back to your side while in a flat bed without using bedrails?: A Little Help needed moving from lying on your back to sitting on the side of a flat bed without using bedrails?: A Little Help needed moving to and from a bed to a chair (including a wheelchair)?: None Help needed standing up from a chair using your arms (e.g., wheelchair or bedside chair)?: None Help needed to walk in hospital room?: A Little Help needed climbing 3-5 steps with a railing? : A Little 6 Click Score: 20    End of Session   Activity Tolerance: Patient tolerated treatment well Patient left: in bed;with call bell/phone within reach;with bed alarm set Nurse Communication: Mobility status PT Visit Diagnosis: Other abnormalities of gait and mobility (R26.89)    Time: 1700-1720 PT Time Calculation (min) (ACUTE ONLY): 20 min   Charges:   PT Evaluation $PT Eval Moderate Complexity: 1 Mod          Maggie Font, PT Acute Rehab Services Pager [phone] [facility] [phone] [facility] 11/30/2019, 5:51 PM

## 2019-11-30 NOTE — Progress Notes (Addendum)
Daily Rounding Note  11/30/2019, 1:22 PM  LOS: 3 days   SUBJECTIVE:   Chief complaint: complicated Crohn's ileitis.    POD 1 from ex lap with ileocecectomy, appendectomy, ileocolonic side-to-side anastomosis. Dr. Ninfa Linden notes extensive bowel wall thickening with fistula between loops of bowel. He suctioned large amounts of enteric contents from SB.  The fascia was closed, but skin left open.    Antibiotics day 2. Pt feels much better than pre-surgery. Abdominal pain well controlled with PCA. No nausea, vomiting. No flatus or BM.   Output from NG tube recorded at 200 mL yesterday (not clear it was accurately counted) 500 mL so far today. Has already put out 1.2 L of urine today. WBCs 17.5 >> 12.5 over last 24 hours.     OBJECTIVE:         Vital signs in last 24 hours:    Temp:  [97.6 F (36.4 C)-98.7 F (37.1 C)] 98.1 F (36.7 C) (04/01 1008) Pulse Rate:  [73-89] 79 (04/01 1008) Resp:  [11-18] 17 (04/01 1026) BP: (141-169)/(81-93) 169/93 (04/01 1008) SpO2:  [96 %-100 %] 98 % (04/01 1026) Last BM Date: 11/25/19 Filed Weights   11/27/19 0258 11/29/19 0915  Weight: 61.2 kg 61.2 kg   General: thin, alert comfortable at rest.     Heart: RRR Chest: clear bil.   Abdomen: soft, ND.  Minimal bloody drainage in left sided JP drain.  Long, clean ABD dressing on incisional site.  Pt showed me photo on his wound: healthy, beefy-red sutured fascia.      Extremities: no CCE Neuro/Psych:  Oriented fully and appropriate.  Animated, seems in good spirits.    Intake/Output from previous day: 03/31 0701 - 04/01 0700 In: 4697.3 [I.V.:3797.3; IV Piggyback:900] Out: 1860 [Urine:1100; Emesis/NG output:200; Drains:260; Blood:300]  Intake/Output this shift: Total I/O In: -  Out: 8003 [Urine:1200; Emesis/NG output:500; Drains:15]  Lab Results: Recent Labs    11/29/19 0220 11/29/19 1431 11/30/19 0533  WBC 8.9 17.5* 12.5*  HGB  11.5* 12.6* 11.0*  HCT 35.0* 39.3 34.8*  PLT 287 306 290   BMET Recent Labs    11/28/19 0422 11/29/19 0220 11/30/19 0533  NA 137 136 135  K 4.1 3.9 4.3  CL 102 102 98  CO2 24 23 26   GLUCOSE 94 127* 116*  BUN 13 10 9   CREATININE 0.82 0.79 0.84  CALCIUM 9.2 8.9 9.2   LFT Recent Labs    11/28/19 0422 11/30/19 0533  PROT 6.0* 6.0*  ALBUMIN 2.6* 2.8*  AST 12* 15  ALT 16 13  ALKPHOS 57 53  BILITOT 0.9 0.6   PT/INR No results for input(s): LABPROT, INR in the last 72 hours. Hepatitis Panel No results for input(s): HEPBSAG, HCVAB, HEPAIGM, HEPBIGM in the last 72 hours.  Studies/Results: DG Abd 1 View  Result Date: 11/29/2019 CLINICAL DATA:  Nasogastric placement. EXAM: ABDOMEN - 1 VIEW COMPARISON:  None. FINDINGS: Nasogastric tube enters the stomach with its tip in the mid body. Other catheter overlies the pelvis. Bowel gas pattern unremarkable. IMPRESSION: Nasogastric tube tip in the body of the stomach. Electronically Signed   By: Nelson Chimes M.D.   On: 11/29/2019 12:47   [place] EKG SITE RITE  Result Date: 11/29/2019 If Site Rite image not attached, placement could not be confirmed due to current cardiac rhythm.  Scheduled Meds: . sodium chloride   Intravenous Once  . chewing gum (ORBIT) sugar free  1 Stick Oral  QID  . Chlorhexidine Gluconate Cloth  6 each Topical Daily  . enoxaparin (LOVENOX) injection  40 mg Subcutaneous Q24H  . HYDROmorphone   Intravenous Q4H  . insulin aspart  0-9 Units Subcutaneous Q4H  . mupirocin ointment  1 application Nasal BID  . sodium chloride flush  10-40 mL Intracatheter Q12H   Continuous Infusions: . sodium chloride 125 mL/hr at 11/30/19 1038  . sodium chloride    . acetaminophen 1,000 mg (11/30/19 1141)  . methocarbamol (ROBAXIN) IV 500 mg (11/30/19 0427)  . piperacillin-tazobactam 3.375 g (11/30/19 0422)  . TPN ADULT (ION)     PRN Meds:.acetaminophen, diphenhydrAMINE **OR** diphenhydrAMINE, lip balm, naloxone **AND** sodium  chloride flush, ondansetron **OR** ondansetron (ZOFRAN) IV, sodium chloride flush  ASSESMENT:   *  Crohn's ileitis with SBO and fistulization. Medical management complicated by lack of insurance and financial limitations. S/p 3/31/2021ex lap with ileocecectomy, appendectomy, ileocolonic side-to-side anastomosis. Was on Solu-Medrol, last dose was 10:30 AM on 3/30.  Upon  admission on prednisone taper (40 mg x 2 weeks, 30 mg started 11/25/19).   *   Postoperative ileus, anticipate this will last at least a few days. NPO.  TPN ordered, should start this evening.Marland Kitchen   PLAN   Has f/u Dr. Carlean Purl after d/c Call if ?'s    Azucena Freed  11/30/2019, 1:22 PM Phone [phone]

## 2019-11-30 NOTE — Progress Notes (Signed)
PHARMACY - TOTAL PARENTERAL NUTRITION CONSULT NOTE   Indication: Prolonged ileus  Patient Measurements: Height: 6' (182.9 cm) Weight: 61.2 kg (134 lb 14.7 oz) IBW/kg (Calculated) : 77.6 TPN AdjBW (KG): 61.2 Body mass index is 18.3 kg/m.  Assessment: 44 year old gentleman with history of Crohn's disease, cannabis use and asthma presenting with several days of abdominal pain. Last bowel movement was 2 days PTA. 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  Anticipate a prolonged post-op ileus.  Glucose / Insulin: CBGs well controlled, has not required insulin so far prior to TPN start Electrolytes: WNL except Mg 1.8 (goal>/= 2 with ileus) Renal: WNL LFTs / TGs: LFTs + Tbili WNL, TG 81 Prealbumin / albumin: Prealbumin 15, albumin 2.8 Intake / Output; MIVF: UOP 0.7, NG O/P 200, drain O/P 260 - NS at 127m/hr GI Imaging: 3/29 CT abd - Active Crohn's enteritis with chronic, partial small bowel obstruction at the distal ileum. 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 Cl:Ac ratio 1:1 Add standard MVI MWF due to national shortage and trace elements to TPN Continue Sensitive q4h SSI and adjust as needed  Reduce MIVF to 85 mL/hr at 1800 Monitor TPN labs on Mon/Thurs Check AM BMET, Mg and Phos Mg 2g IV x 1 F/u RD recommendations  Eric Castillo,  Eric Castillo Eric Lawman21,9:09 AM

## 2019-12-01 DIAGNOSIS — Z4659 Encounter for fitting and adjustment of other gastrointestinal appliance and device: Secondary | ICD-10-CM

## 2019-12-01 LAB — CBC
HCT: 29.9 % — ABNORMAL LOW (ref 39.0–52.0)
Hemoglobin: 9.8 g/dL — ABNORMAL LOW (ref 13.0–17.0)
MCH: 28.1 pg (ref 26.0–34.0)
MCHC: 32.8 g/dL (ref 30.0–36.0)
MCV: 85.7 fL (ref 80.0–100.0)
Platelets: 263 10*3/uL (ref 150–400)
RBC: 3.49 MIL/uL — ABNORMAL LOW (ref 4.22–5.81)
RDW: 18.6 % — ABNORMAL HIGH (ref 11.5–15.5)
WBC: 10.2 10*3/uL (ref 4.0–10.5)
nRBC: 0 % (ref 0.0–0.2)

## 2019-12-01 LAB — GLUCOSE, CAPILLARY
Glucose-Capillary: 108 mg/dL — ABNORMAL HIGH (ref 70–99)
Glucose-Capillary: 112 mg/dL — ABNORMAL HIGH (ref 70–99)
Glucose-Capillary: 109 mg/dL — ABNORMAL HIGH (ref 70–99)
Glucose-Capillary: 111 mg/dL — ABNORMAL HIGH (ref 70–99)
Glucose-Capillary: 113 mg/dL — ABNORMAL HIGH (ref 70–99)

## 2019-12-01 LAB — BASIC METABOLIC PANEL
Anion gap: 11 (ref 5–15)
BUN: 6 mg/dL (ref 6–20)
CO2: 28 mmol/L (ref 22–32)
Calcium: 8.7 mg/dL — ABNORMAL LOW (ref 8.9–10.3)
Chloride: 96 mmol/L — ABNORMAL LOW (ref 98–111)
Creatinine, Ser: 0.84 mg/dL (ref 0.61–1.24)
GFR calc Af Amer: >60 mL/min (ref >60)
GFR calc non Af Amer: >60 mL/min (ref >60)
Glucose, Bld: 132 mg/dL — ABNORMAL HIGH (ref 70–99)
Potassium: 3.2 mmol/L — ABNORMAL LOW (ref 3.5–5.1)
Sodium: 135 mmol/L (ref 135–145)

## 2019-12-01 LAB — SURGICAL PATHOLOGY

## 2019-12-01 LAB — MAGNESIUM: Magnesium: 2 mg/dL (ref 1.7–2.4)

## 2019-12-01 LAB — PHOSPHORUS: Phosphorus: 2.9 mg/dL (ref 2.5–4.6)

## 2019-12-01 MED ORDER — KETOROLAC TROMETHAMINE 15 MG/ML IJ SOLN
15.0000 mg | Freq: Four times a day (QID) | INTRAMUSCULAR | Status: AC
  Administered 2019-12-01 – 2019-12-02 (×5): 15 mg via INTRAVENOUS
  Filled 2019-12-01 (×5): qty 1

## 2019-12-01 MED ORDER — POTASSIUM CHLORIDE 10 MEQ/50ML IV SOLN
10.0000 meq | INTRAVENOUS | Status: AC
  Administered 2019-12-01 (×4): 10 meq via INTRAVENOUS
  Filled 2019-12-01 (×4): qty 50

## 2019-12-01 MED ORDER — TRAVASOL 10 % IV SOLN
INTRAVENOUS | Status: AC
  Filled 2019-12-01: qty 1203.36

## 2019-12-01 MED ORDER — SODIUM CHLORIDE 0.9 % IV SOLN: INTRAVENOUS | Status: DC

## 2019-12-01 NOTE — Evaluation (Signed)
Occupational Therapy Evaluation Patient Details Name: Eric Castillo. MRN: [id number] DOB: 01/16/1976 Today's Date: 12/01/2019    History of Present Illness Pt is 44 yo male presenting to ED with 2-3 day hx of abd/pain.  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 Pt is s/p exp lap, ileocecectomy, appendcectomy on 11/29/19 - note fascia close but skin left open with dressing.  Pt with hx of Crohn' disease.   Clinical Impression   PTA patient independent and working. Admitted for above and limited by pain, decreased activity tolerance.  Requires supervision for bed mobility and transfers, supervision for LB ADLs after education of compensatory techniques. Pt reports fatigued today, increased stress from loss of job and apartment recently (therefore unsure of dc location). He will benefit from further OT services while admitted to optimize independence and activity tolerance for ADLs, mobility but anticipate no further needs after dc home. Will follow.     Follow Up Recommendations  No OT follow up;Supervision - Intermittent    Equipment Recommendations  None recommended by OT    Recommendations for Other Services       Precautions / Restrictions Precautions Precautions: Fall Precaution Comments: NG tube, JP drain Restrictions Weight Bearing Restrictions: No      Mobility Bed Mobility Overal bed mobility: Needs Assistance Bed Mobility: Supine to Sit;Sit to Supine     Supine to sit: Supervision;HOB elevated Sit to supine: Supervision;HOB elevated   General bed mobility comments: increased time and effort, HOB elevated   Transfers Overall transfer level: Needs assistance Equipment used: None Transfers: Sit to/from Stand Sit to Stand: Supervision         General transfer comment: for safety/line mgmt, increased time     Balance Overall balance assessment: Needs assistance Sitting-balance support: No upper extremity supported;Feet supported Sitting  balance-Leahy Scale: Good     Standing balance support: No upper extremity supported;During functional activity Standing balance-Leahy Scale: Good                             ADL either performed or assessed with clinical judgement   ADL Overall ADL's : Needs assistance/impaired     Grooming: Set up;Sitting   Upper Body Bathing: Set up;Sitting   Lower Body Bathing: Supervison/ safety;Sit to/from stand;Cueing for compensatory techniques   Upper Body Dressing : Minimal assistance;Sitting Upper Body Dressing Details (indicate cue type and reason): line mgmt  Lower Body Dressing: Supervision/safety;Sit to/from stand;Cueing for compensatory techniques Lower Body Dressing Details (indicate cue type and reason): donning underwear and simulated socks; supervision sit to stand  Toilet Transfer: Supervision/safety Toilet Transfer Details (indicate cue type and reason): simulated          Functional mobility during ADLs: Supervision/safety General ADL Comments: pt limited by pain      Vision Baseline Vision/History: No visual deficits       Perception     Praxis      Pertinent Vitals/Pain Pain Assessment: Faces Faces Pain Scale: Hurts even more Pain Location: abdomen Pain Descriptors / Indicators: Discomfort;Sore Pain Intervention(s): Limited activity within patient's tolerance;Monitored during session;Repositioned     Hand Dominance Right   Extremity/Trunk Assessment Upper Extremity Assessment Upper Extremity Assessment: Overall WFL for tasks assessed   Lower Extremity Assessment Lower Extremity Assessment: Defer to PT evaluation   Cervical / Trunk Assessment Cervical / Trunk Assessment: Normal   Communication Communication Communication: No difficulties   Cognition Arousal/Alertness: Awake/alert Behavior During Therapy: [facility] for  tasks assessed/performed Overall Cognitive Status: Within Functional Limits for tasks assessed                                      General Comments       Exercises     Shoulder Instructions      Home Living Family/patient expects to be discharged to:: Unsure Living Arrangements: Spouse/significant other                           Home Equipment: None   Additional Comments: reports losing apartment and job recently unsure of plan at dc       Prior Functioning/Environment Level of Independence: Independent        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         OT Problem List: Decreased activity tolerance;Pain;Decreased knowledge of use of DME or AE      OT Treatment/Interventions: Self-care/ADL training;DME and/or AE instruction;Therapeutic activities    OT Goals(Current goals can be found in the care plan section) Acute Rehab OT Goals Patient Stated Goal: less pain  OT Goal Formulation: With patient Time For Goal Achievement: 12/15/19 Potential to Achieve Goals: Good  OT Frequency: Min 2X/week   Barriers to D/C:            Co-evaluation              AM-PAC OT "6 Clicks" Daily Activity     Outcome Measure Help from another person eating meals?: Total(NPO) Help from another person taking care of personal grooming?: A Little Help from another person toileting, which includes using toliet, bedpan, or urinal?: A Little Help from another person bathing (including washing, rinsing, drying)?: A Little Help from another person to put on and taking off regular upper body clothing?: A Little Help from another person to put on and taking off regular lower body clothing?: A Little 6 Click Score: 16   End of Session Nurse Communication: Mobility status  Activity Tolerance: Patient tolerated treatment well Patient left: in bed;with call bell/phone within reach  OT Visit Diagnosis: Pain Pain - part of body: (stomach)                Time: 6045-4098 OT Time Calculation (min): 20 min Charges:  OT General Charges $OT Visit: 1 Visit OT  Evaluation $OT Eval Moderate Complexity: 1 Mod  Jolaine Artist, OT Acute Rehabilitation Services Pager [phone] Office [phone]    Delight Stare 12/01/2019, 11:03 AM

## 2019-12-01 NOTE — Progress Notes (Signed)
Family Medicine Teaching Service Daily Progress Note Intern Pager: [phone]  Patient name: Eric Castillo. Medical record number: [id number] Date of birth: 12-29-1975 Age: 44 y.o. 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  Small bowel obstruction/abdominal pain/nausea Postop day #2 exploratory laparotomy/ileocecectomy/appendicectomy for Crohn's disease of small bowel stricture and fistula.  Continues to have pain controlled with PCA pump.  NG tube to continuous suction. Foley catheter removed and is voiding well.  Passed small amount gas yesterday. -Surgery following, appreciate recommendations -GI following appreciate recommendations -PICC line in place for TPN nutrition -IV Zosyn (03/31-) -N.p.o. -Intake and output -Vital signs per floor -Chewing gum as needed -Out of bed -PT/OT eval -Continue normal saline 46m/hr -TPN 478mhr  Crohn's disease/enteroenteric fistula Chronic.s/p ex lap/ileocecectomy/appendecectomy -GI following appreciate recommendations  History of childhood asthma -Continue to monitor  FEN/GI: -NPO w/ sips/ice chips PPx:  -Lovenox  Disposition: Home when cleared by surgery and medically stable  Subjective:  No acute events overnight. Pain well controlled with PCA Dilaudid and reports that pain is at the surgical site.  Passed small gas yesterday.  Plans to get out of bed again today.  Objective: Temp:  [97.6 F (36.4 C)-99.1 F (37.3 C)] 97.7 F (36.5 C) (04/02 0421) Pulse Rate:  [79-86] 81 (04/02 0421) Resp:  [14-18] 15 (04/02 0421) BP: (139-169)/(81-106) 146/86 (04/02 0421) SpO2:  [95 %-100 %] 96 %  (04/02 0421) Physical Exam:  General: Alert and oriented, no apparent distress  Cardiovascular: RRR with no murmurs or gallops appreciated, distal pulses present Respiratory: CTA bilaterally, no wheezing, crackles or increased work of breathing Gastrointestinal: Surgical incision covered with abdominal dressing, dry and intact.  Lt side JP insitu with small amount serosang drainage MSK: Upper extremity strength 5/5 bilaterally, Lower extremity strength 5/5 bilaterally  Laboratory: Recent Labs  Lab 11/29/19 1431 11/30/19 0533 12/01/19 0500  WBC 17.5* 12.5* 10.2  HGB 12.6* 11.0* 9.8*  HCT 39.3 34.8* 29.9*  PLT 306 290 263   Recent Labs  Lab 11/27/19 0258 11/27/19 0258 11/28/19 0422 11/28/19 0422 11/29/19 0220 11/30/19 0533 12/01/19 0500  NA 140   < > 137   < > 136 135 135  K 3.5   < > 4.1   < > 3.9 4.3 3.2*  CL 97*   < > 102   < > 102 98 96*  CO2 29   < > 24   < > 23 26 28   BUN 16   < > 13   < > 10 9 6   CREATININE 0.91   < > 0.82   < > 0.79 0.84 0.84  CALCIUM 9.4   < > 9.2   < > 8.9 9.2 8.7*  PROT 6.8  --  6.0*  --   --  6.0*  --   BILITOT 0.8  --  0.9  --   --  0.6  --   ALKPHOS 60  --  57  --   --  53  --   ALT 22  --  16  --   --  13  --   AST 17  --  12*  --   --  15  --  GLUCOSE 99   < > 94   < > 127* 116* 132*   < > = values in this interval not displayed.    Imaging/Diagnostic Tests:   Carollee Leitz, MD 12/01/2019, 6:23 AM PGY-1, [place] Intern pager: [phone], text pages welcome

## 2019-12-01 NOTE — Progress Notes (Signed)
Physical Therapy Treatment Patient Details Name: Eric Castillo. MRN: [id number] DOB: June 26, 1976 Today's Date: 12/01/2019    History of Present Illness Pt is 44 yo male presenting to ED with 2-3 day hx of abd/pain.  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 Pt is s/p exp lap, ileocecectomy, appendcectomy on 11/29/19 - note fascia close but skin left open with dressing.  Pt with hx of Crohn' disease.    PT Comments    Pt progressing well towards goals. Able to tolerate increased gait distance and very motivated to ambulate this session. Required seated rest X1 secondary to pain. Current recommendations appropriate. Will continue to follow acutely to maximize functional mobility independence and safety.     Follow Up Recommendations  No PT follow up     Equipment Recommendations  None recommended by PT    Recommendations for Other Services       Precautions / Restrictions Precautions Precautions: Fall Precaution Comments: NG tube, JP drain Restrictions Weight Bearing Restrictions: No    Mobility  Bed Mobility Overal bed mobility: Needs Assistance Bed Mobility: Supine to Sit     Supine to sit: Supervision;HOB elevated     General bed mobility comments: increased time and effort, HOB elevated   Transfers Overall transfer level: Needs assistance Equipment used: None Transfers: Sit to/from Stand Sit to Stand: Supervision         General transfer comment: for safety/line mgmt, increased time   Ambulation/Gait Ambulation/Gait assistance: Supervision Gait Distance (Feet): 200 Feet Assistive device: IV Pole Gait Pattern/deviations: Decreased stride length;Trunk flexed Gait velocity: decreased   General Gait Details: Supervision for safety. Increased pain reported and required seated rest X1. Relying on IV for support   Stairs             Wheelchair Mobility    Modified Rankin (Stroke Patients Only)       Balance Overall balance  assessment: Needs assistance Sitting-balance support: No upper extremity supported;Feet supported Sitting balance-Leahy Scale: Good     Standing balance support: No upper extremity supported;Single extremity supported;During functional activity Standing balance-Leahy Scale: Fair Standing balance comment: Able to maintain static standing without UE Support                             Cognition Arousal/Alertness: Awake/alert Behavior During Therapy: WFL for tasks assessed/performed Overall Cognitive Status: Within Functional Limits for tasks assessed                                 General Comments: Pt crying at points during session secondary to pain, but otherwise appropriate      Exercises      General Comments        Pertinent Vitals/Pain Pain Assessment: 0-10 Pain Score: 8  Pain Location: abdomen Pain Descriptors / Indicators: Discomfort;Sore;Crying Pain Intervention(s): Limited activity within patient's tolerance;Monitored during session;Repositioned    Home Living                      Prior Function            PT Goals (current goals can now be found in the care plan section) Acute Rehab PT Goals Patient Stated Goal: less pain  PT Goal Formulation: With patient Time For Goal Achievement: 12/14/19 Potential to Achieve Goals: Good Progress towards PT goals: Progressing toward goals    Frequency  Min 3X/week      PT Plan Current plan remains appropriate    Co-evaluation              AM-PAC PT "6 Clicks" Mobility   Outcome Measure  Help needed turning from your back to your side while in a flat bed without using bedrails?: A Little Help needed moving from lying on your back to sitting on the side of a flat bed without using bedrails?: A Little Help needed moving to and from a bed to a chair (including a wheelchair)?: None Help needed standing up from a chair using your arms (e.g., wheelchair or bedside  chair)?: None Help needed to walk in hospital room?: None Help needed climbing 3-5 steps with a railing? : A Little 6 Click Score: 21    End of Session   Activity Tolerance: Patient tolerated treatment well Patient left: in chair;with call bell/phone within reach;with family/visitor present Nurse Communication: Mobility status PT Visit Diagnosis: Other abnormalities of gait and mobility (R26.89)     Time: 3159-4585 PT Time Calculation (min) (ACUTE ONLY): 20 min  Charges:  $Gait Training: 8-22 mins                     Lou Miner, DPT  Acute Rehabilitation Services  Pager: [phone] Office: [phone]    Rudean Hitt 12/01/2019, 6:19 PM

## 2019-12-01 NOTE — TOC Progression Note (Signed)
Transition of Care (TOC) - Progression Note    Patient Details  Name: Eric Castillo. MRN: [id number] Date of Birth: 1976/08/21  Transition of Care [facility]) CM/SW Contact  Suni Jarnagin, Edson Snowball, RN Phone Number: 12/01/2019, 11:51 AM  Clinical Narrative:     Spoke with patient at bedside. See prior note. Patient was given 7 day notice to move out of his apartment, and no longer has employment. His girl friend was living with him, and has now moved back in with her family. She has offered he can stay with them at discharge, because really does not want to. He has been looking at housing resources provided to him prior to surgery. He is not sure if he will stay in [place] or move back to [place] at discharge.  Patient aware nursing staff will teach him wound care prior to discharge. NCM will assist with prescriptions at discharge. If patient plans to stay in [facility] NCM will schedule follow up appointment at a [facility]. NCM will continue to follow.   Expected Discharge Plan: Home/Self Care Barriers to Discharge: Continued Medical Work up  Expected Discharge Plan and Services Expected Discharge Plan: Home/Self Care In-house Referral: Financial Counselor Discharge Planning Services: CM Consult   Living arrangements for the past 2 months: Apartment                 DME Arranged: N/A         HH Arranged: NA           Social Determinants of Health (SDOH) Interventions    Readmission Risk Interventions Readmission Risk Prevention Plan 12/30/2018  Post Dischage Appt Complete  Medication Screening Complete  Transportation Screening Complete

## 2019-12-01 NOTE — Progress Notes (Signed)
PHARMACY - TOTAL PARENTERAL NUTRITION CONSULT NOTE   Indication: Prolonged ileus  Patient Measurements: Height: 6' (182.9 cm) Weight: 61.2 kg (134 lb 14.7 oz) IBW/kg (Calculated) : 77.6 TPN AdjBW (KG): 61.2 Body mass index is 18.3 kg/m.  Assessment: 44 year old gentleman with history of Crohn's disease, cannabis use and asthma presenting with several days of abdominal pain. Last bowel movement was 2 days PTA. 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  Anticipate a prolonged post-op ileus.  Glucose / Insulin: CBGs well controlled, has not required insulin so far Electrolytes: WNL except K 3.2 (goal>/= 4 with ileus) Renal: Scr 0.84 LFTs / TGs: LFTs + Tbili WNL, TG 81 Prealbumin / albumin: Prealbumin 15, albumin 2.8 Intake / Output; MIVF: UOP 2.4, NG O/P 2000, drain O/P 70 - NS at 68m/hr; LBM 3/27 GI Imaging: 3/29 CT abd - Active Crohn's enteritis with chronic, partial small bowel obstruction at the distal ileum. 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 marked on coronal reformats Surgeries / Procedures:  3/31 Exlap, ileocecectomy, appendectomy  Central access: PICC placed 4/1 TPN start date: 4/1  Nutritional Goals (per RD recommendation on 4/1): KCal: 2250-2450, Protein: 120-135g, Fluid >2.2L Goal TPN rate is 92 mL/hr (provides 120 g of protein and 2285 kcals per day)  Current Nutrition:  TPN  Plan:  Increase TPN to goal rate of 990mhr at 1800 - provides 120g protein, 322g dextrose, 71g lipid and 2285kcal meeting 100% of patient needs Electrolytes in TPN: 5043mL of Na, 49m70m of K, 5mEq48mof Ca, 5mEq/67mf Mg, and 15mmol10mf Phos. Cl:Ac ratio 1:1 *All electrolytes will increase with increasing rate* Add standard MVI MWF due to national shortage and trace elements to TPN Continue Sensitive q4h SSI and adjust as needed  Reduce MIVF to 30ml/hr2might when new TPN starts - defer to MD for further  adjustments of MIVF Monitor TPN labs on Mon/Thurs Check AM BMET, Mg and Phos KCl 10meq x 16mns, potassium will increase with increasing TPN rate  Kianna Billet LyRande Lawman,7:56 AM

## 2019-12-01 NOTE — Progress Notes (Signed)
Nutrition Follow-up  DOCUMENTATION CODES:   Underweight  INTERVENTION:   -TPN management per pharmacy -RD will follow for diet advancement and adjust supplement regimen as appropriate  NUTRITION DIAGNOSIS:   Increased nutrient needs related to post-op healing as evidenced by estimated needs.  Ongoing  GOAL:   Patient will meet greater than or equal to 90% of their needs  Progressing   MONITOR:   Diet advancement, Labs, Weight trends, Skin, I & O's  REASON FOR ASSESSMENT:   Consult New TPN/TNA  ASSESSMENT:   Eric Castillo. is a 44 y.o. male presenting with 2 to 3-day history of intermittent severe abdominal pain, constipation, vomiting.  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  11/29/19- s/p Procedure(s): EXPLORATORY LAPAROTOMY ILEOCECECTOMY  APPENDCECTOMY 4/1- PICC placed, TPN initiated  Reviewed I/O's: -2.6 L x 24 hours and +2.7 L since admission  UOP: 3.5 L x 24 hours  NGT output: 2 L x 24 hours  Drain output: 70 ml x 24 hours  Pt working with OT at time of visit. Case discussed with RN, who reports pt tolerating TPN well. He remains with NGT to suction.   Pt receiving TPN at 40 ml/hr, which will provides 960 kcals and 40 grams protein, meeting 42% of estimated kcal needs and 33 grams protein. Per pharmacy note, plan to increase TPN to goal rate of 92 ml/hr at 1800. Regimen will provides 2285 kcals and 120 grams protein, meeting 100% of kcal and protein needs.  Labs reviewed: K: 3.2 (on IV supplementation), Mg and Phos WDL. CBGS: 108-116 (inpatient orders for glycemic control are 0-9 units insulin aspart every 4 hours)  Diet Order:   Diet Order            Diet NPO time specified Except for: Ice Chips  Diet effective now              EDUCATION NEEDS:   Education needs have been addressed  Skin:  Skin Assessment: Skin Integrity Issues: Skin Integrity Issues:: Incisions Incisions: closed  abdomen  Last BM:  11/25/19  Height:   Ht Readings from Last 1 Encounters:  11/29/19 6' (1.829 m)    Weight:   Wt Readings from Last 1 Encounters:  11/29/19 61.2 kg    Ideal Body Weight:  80.9 kg  BMI:  Body mass index is 18.3 kg/m.  Estimated Nutritional Needs:   Kcal:  2111-5520  Protein:  120-135 grams  Fluid:  > 2.2 L    Loistine Chance, RD, LDN, [place] Registered Dietitian II Certified Diabetes Care and Education Specialist Please refer to [facility] for RD and/or RD on-call/weekend/after hours pager

## 2019-12-01 NOTE — Progress Notes (Signed)
Assessment & Plan: CROHN'S DISEASE WITH SMALL BOWEL STRICTURE AND FISTULA POD#2 - S/p EXPLORATORY LAPAROTOMY, ILEOCECECTOMY, APPENDECTOMY 3/31 Dr. Ninfa Linden - surgical path pending - expect prolonged ileus, continue NPO/NGT to LIWS and await return in bowel function - TPN for nutritional support while NPO - continue IV zosyn - continue JP drain and monitor output - mobilize, PT/OT to see - discontinue scheduled IV tylenol and robaxin; add Toradol IV for pain control, continue dilaudid PCA  ID - zosyn 3/31>> FEN - IVF, NPO/NGT to LIWS, TPN VTE - SCDs, start lovenox and check CBC in AM Follow up - Dr. Ninfa Linden, GI        Armandina Gemma, MD       [facility]       Office: [phone]   Chief Complaint: Crohn's disease  Subjective: Patient in bed, comfortable.  Dressing changed this AM.  Up to chair yesterday.  No flatus or BM.  Objective: Vital signs in last 24 hours: Temp:  [97.6 F (36.4 C)-99.1 F (37.3 C)] 97.7 F (36.5 C) (04/02 0421) Pulse Rate:  [79-86] 81 (04/02 0421) Resp:  [14-18] 15 (04/02 0421) BP: (139-169)/(81-106) 146/86 (04/02 0421) SpO2:  [95 %-100 %] 96 % (04/02 0421) Last BM Date: 11/25/19  Intake/Output from previous day: 04/01 0701 - 04/02 0700 In: 2974.2 [I.V.:2272.9; IV Piggyback:701.3] Out: 5570 [Urine:3500; Emesis/NG output:2000; Drains:70] Intake/Output this shift: No intake/output data recorded.  Physical Exam: HEENT - sclerae clear, mucous membranes moist Neck - soft Chest - clear bilaterally Cor - RRR Abdomen - soft, quiet; dressing dry and intact; JP with small, thin serous output Ext - no edema, non-tender Neuro - alert & oriented, no focal deficits  Lab Results:  Recent Labs    11/30/19 0533 12/01/19 0500  WBC 12.5* 10.2  HGB 11.0* 9.8*  HCT 34.8* 29.9*  PLT 290 263   BMET Recent Labs    11/30/19 0533 12/01/19 0500  NA 135 135  K 4.3 3.2*  CL 98 96*  CO2 26 28  GLUCOSE 116* 132*  BUN 9 6    CREATININE 0.84 0.84  CALCIUM 9.2 8.7*   PT/INR No results for input(s): LABPROT, INR in the last 72 hours. Comprehensive Metabolic Panel:    Component Value Date/Time   NA 135 12/01/2019 0500   NA 135 11/30/2019 0533   K 3.2 (L) 12/01/2019 0500   K 4.3 11/30/2019 0533   CL 96 (L) 12/01/2019 0500   CL 98 11/30/2019 0533   CO2 28 12/01/2019 0500   CO2 26 11/30/2019 0533   BUN 6 12/01/2019 0500   BUN 9 11/30/2019 0533   CREATININE 0.84 12/01/2019 0500   CREATININE 0.84 11/30/2019 0533   GLUCOSE 132 (H) 12/01/2019 0500   GLUCOSE 116 (H) 11/30/2019 0533   CALCIUM 8.7 (L) 12/01/2019 0500   CALCIUM 9.2 11/30/2019 0533   AST 15 11/30/2019 0533   AST 12 (L) 11/28/2019 0422   ALT 13 11/30/2019 0533   ALT 16 11/28/2019 0422   ALKPHOS 53 11/30/2019 0533   ALKPHOS 57 11/28/2019 0422   BILITOT 0.6 11/30/2019 0533   BILITOT 0.9 11/28/2019 0422   PROT 6.0 (L) 11/30/2019 0533   PROT 6.0 (L) 11/28/2019 0422   ALBUMIN 2.8 (L) 11/30/2019 0533   ALBUMIN 2.6 (L) 11/28/2019 0422    Studies/Results: DG Abd 1 View  Result Date: 11/29/2019 CLINICAL DATA:  Nasogastric placement. EXAM: ABDOMEN - 1 VIEW COMPARISON:  None. FINDINGS: Nasogastric tube enters the stomach  with its tip in the mid body. Other catheter overlies the pelvis. Bowel gas pattern unremarkable. IMPRESSION: Nasogastric tube tip in the body of the stomach. Electronically Signed   By: Nelson Chimes M.D.   On: 11/29/2019 12:47   [place] EKG SITE RITE  Result Date: 11/29/2019 If Site Rite image not attached, placement could not be confirmed due to current cardiac rhythm.     Armandina Gemma 12/01/2019  Patient ID: Eric Castillo., male   DOB: 08-27-1976, 44 y.o.   MRN: [id number]

## 2019-12-02 LAB — GLUCOSE, CAPILLARY
Glucose-Capillary: 110 mg/dL — ABNORMAL HIGH (ref 70–99)
Glucose-Capillary: 113 mg/dL — ABNORMAL HIGH (ref 70–99)
Glucose-Capillary: 114 mg/dL — ABNORMAL HIGH (ref 70–99)
Glucose-Capillary: 116 mg/dL — ABNORMAL HIGH (ref 70–99)
Glucose-Capillary: 123 mg/dL — ABNORMAL HIGH (ref 70–99)

## 2019-12-02 MED ORDER — TRAVASOL 10 % IV SOLN
INTRAVENOUS | Status: DC
  Filled 2019-12-02: qty 1203.36

## 2019-12-02 NOTE — Progress Notes (Signed)
Family Medicine Teaching Service Daily Progress Note Intern Pager: [phone]  Patient name: Eric Castillo. Medical record number: [id number] Date of birth: June 18, 1976 Age: 44 y.o. 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 Pain well controlled with Dilaudid PCA.  Vss, Tmax 100.4. Received Tylenol with return to normal. Remains NPO.  TPN via PICC.  NG to suction with 2.47L output.  Voiding well.  Has not been passing much gas -Surgery following, appreciate recommendations -Continue TPN 69m/hr -N/S 82mhr -IV Zosyn (03/31-) -NPO -Chewing gum as needed -intake and output -PT/OT -OOB  Crohn's disease/enteroenteric fistula Chronic.s/p ex lap/ileocecectomy/appendecectomy -GI following appreciate recommendations  History of childhood asthma -Continue to monitor  FEN/GI: -NPO w/ sips/ice chips PPx:  -Lovenox  Disposition: Home when cleared by surgery and medically stable  Subjective:  No acute events overnight.  Tmax 100.4 and resolved with tylenol. Denies any chest pain, SOB, worsening abdominal pain or fevers.  Plans to get OOB today again.  Objective: Temp:  [98.2 F (36.8 C)-100.4 F (38 C)] 98.2 F (36.8 C) (04/03 0645) Pulse Rate:  [79-105] 90 (04/03 0645) Resp:  [13-20] 18 (04/03 0730) BP: (135-167)/(84-95) 135/84 (04/03 0645) SpO2:  [96 %-100 %] 97 % (04/03 0730) Physical Exam:  General: Alert and orientated, in no acute distress Cardiovascular: Regular rate and rhythm,  no murmurs or gallops appreciated.  Distal pulses present Respiratory: Chest clear to auscultation bilaterally, no wheezes or crackles appreciated no increased work of breathing Gastrointestinal: Surgical incision abdominal dressing remains dry and intact.  Left JP site in situ with small amount of serous drainage. Extremities: No lower extremity edema, moves all extremities.  Laboratory: Recent Labs  Lab 11/29/19 1431 11/30/19 0533 12/01/19 0500  WBC 17.5* 12.5* 10.2  HGB 12.6* 11.0* 9.8*  HCT 39.3 34.8* 29.9*  PLT 306 290 263   Recent Labs  Lab 11/27/19 0258 11/27/19 0258 11/28/19 0422 11/28/19 0422 11/29/19 0220 11/30/19 0533 12/01/19 0500  NA 140   < > 137   < > 136 135 135  K 3.5   < > 4.1   < > 3.9 4.3 3.2*  CL 97*   < > 102   < > 102 98 96*  CO2 29   < > 24   < > 23 26 28   BUN 16   < > 13   < > 10 9 6   CREATININE 0.91   < > 0.82   < > 0.79 0.84 0.84  CALCIUM 9.4   < > 9.2   < > 8.9 9.2 8.7*  PROT 6.8  --  6.0*  --   --  6.0*  --   BILITOT 0.8  --  0.9  --   --  0.6  --   ALKPHOS 60  --  57  --   --  53  --   ALT 22  --  16  --   --  13  --   AST 17  --  12*  --   --  15  --   GLUCOSE 99   < > 94   < >  127* 116* 132*   < > = values in this interval not displayed.    Imaging/Diagnostic Tests:   Carollee Leitz, MD 12/02/2019, 7:57 AM PGY-1, [place] Intern pager: [phone], text pages welcome

## 2019-12-02 NOTE — Progress Notes (Signed)
     Assessment & Plan: CROHN'S DISEASE WITH SMALL BOWEL STRICTURE AND FISTULA POD#3 - S/pEXPLORATORY LAPAROTOMY,ILEOCECECTOMY,APPENDECTOMY3/31 Dr. Ninfa Linden - expect prolonged ileus, continue NPO/NGT to LIWS and await return in bowel function - TPN for nutritional support while NPO - continue IV zosyn - continue JP drain and monitor output - mobilize, PT/OT  ID -zosyn 3/31>> FEN -IVF, NPO/NGT to LIWS, TPN VTE -SCDs, start lovenox and check CBC in AM Follow up -Dr. Ninfa Linden, GI        Armandina Gemma, MD       [facility]       Office: [phone]   Chief Complaint: Crohn's disease  Subjective: Patient in bed, comfortable.  No flatus or BM.  Ambulated in halls yesterday.  Improved with Toradol.  Objective: Vital signs in last 24 hours: Temp:  [98.2 F (36.8 C)-100.4 F (38 C)] 98.2 F (36.8 C) (04/03 0759) Pulse Rate:  [79-105] 86 (04/03 0759) Resp:  [13-20] 18 (04/03 0759) BP: (134-167)/(83-95) 134/83 (04/03 0759) SpO2:  [96 %-100 %] 100 % (04/03 0759) Last BM Date: 11/25/19  Intake/Output from previous day: 04/02 0701 - 04/03 0700 In: 1492.4 [I.V.:1192.4; IV Piggyback:300] Out: 3490 [Urine:975; Emesis/NG output:2470; Drains:45] Intake/Output this shift: No intake/output data recorded.  Physical Exam: HEENT - sclerae clear, mucous membranes moist Neck - soft Chest - clear bilaterally Cor - RRR Abdomen - soft, mild distension; few BS present; dressing dry and intact; small serous in JP Ext - no edema, non-tender Neuro - alert & oriented, no focal deficits  Lab Results:  Recent Labs    11/30/19 0533 12/01/19 0500  WBC 12.5* 10.2  HGB 11.0* 9.8*  HCT 34.8* 29.9*  PLT 290 263   BMET Recent Labs    11/30/19 0533 12/01/19 0500  NA 135 135  K 4.3 3.2*  CL 98 96*  CO2 26 28  GLUCOSE 116* 132*  BUN 9 6  CREATININE 0.84 0.84  CALCIUM 9.2 8.7*   PT/INR No results for input(s): LABPROT, INR in the last 72  hours. Comprehensive Metabolic Panel:    Component Value Date/Time   NA 135 12/01/2019 0500   NA 135 11/30/2019 0533   K 3.2 (L) 12/01/2019 0500   K 4.3 11/30/2019 0533   CL 96 (L) 12/01/2019 0500   CL 98 11/30/2019 0533   CO2 28 12/01/2019 0500   CO2 26 11/30/2019 0533   BUN 6 12/01/2019 0500   BUN 9 11/30/2019 0533   CREATININE 0.84 12/01/2019 0500   CREATININE 0.84 11/30/2019 0533   GLUCOSE 132 (H) 12/01/2019 0500   GLUCOSE 116 (H) 11/30/2019 0533   CALCIUM 8.7 (L) 12/01/2019 0500   CALCIUM 9.2 11/30/2019 0533   AST 15 11/30/2019 0533   AST 12 (L) 11/28/2019 0422   ALT 13 11/30/2019 0533   ALT 16 11/28/2019 0422   ALKPHOS 53 11/30/2019 0533   ALKPHOS 57 11/28/2019 0422   BILITOT 0.6 11/30/2019 0533   BILITOT 0.9 11/28/2019 0422   PROT 6.0 (L) 11/30/2019 0533   PROT 6.0 (L) 11/28/2019 0422   ALBUMIN 2.8 (L) 11/30/2019 0533   ALBUMIN 2.6 (L) 11/28/2019 0422    Studies/Results: No results found.    Armandina Gemma 12/02/2019  Patient ID: [name]., male   DOB: 19-Oct-1975, 43 y.o.   MRN: [id number]

## 2019-12-02 NOTE — Progress Notes (Signed)
PHARMACY - TOTAL PARENTERAL NUTRITION CONSULT NOTE   Indication: Prolonged ileus  Patient Measurements: Height: 6' (182.9 cm) Weight: 61.2 kg (134 lb 14.7 oz) IBW/kg (Calculated) : 77.6 TPN AdjBW (KG): 61.2 Body mass index is 18.3 kg/m.  Assessment: 44 year old gentleman with history of Crohn's disease, cannabis use and asthma presenting with several days of abdominal pain. Last bowel movement was 2 days PTA. 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  Anticipate a prolonged post-op ileus.  Glucose / Insulin: CBGs all <123 , 2 units SSI (GIF 3.66) Electrolytes: WNL except K 3.2 (goal>/= 4 with ileus)n (Ca*Phos product 28, goal <55) Renal: Scr 0.84 (UOP 0.7) LFTs / TGs: LFTs + Tbili WNL, TG 81 Prealbumin / albumin: Prealbumin 15, albumin 2.8 Intake / Output; MIVF: UOP 0.7, NG O/P 2470 up, drain O/P 45  - NS at 39m/hr; LBM 3/27 GI Imaging: 3/29 CT abd - Active Crohn's enteritis with chronic, partial small bowel obstruction at the distal ileum. 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 marked on coronal reformats Surgeries / Procedures:  3/31 Exlap, ileocecectomy, appendectomy  Central access: PICC placed 4/1 TPN start date: 4/1  Nutritional Goals (per RD recommendation on 4/1): KCal: 2250-2450, Protein: 120-135g, Fluid >2.2L Goal TPN rate is 92 mL/hr (provides 120 g of protein and 2285 kcals per day)  Current Nutrition:  TPN  Chewing gum ordered  Plan:  Con't TPN at goal rate of 910mhr at 1800 - provides 120g protein, 322g dextrose, 71g lipid and 2285kcal meeting 100% of patient needs Electrolytes in TPN: no change Add standard MVI MWF due to national shortage and trace elements to TPN Continue Sensitive q4h SSI and adjust as needed  Monitor TPN labs on Mon/Thurs and prn Check AM BMET, Mg and Phos   Abdulraheem Pineo S. RoAlford HighlandPharmD, BCPS Clinical Staff Pharmacist [email or web address]  RoAlford HighlandCrystal  Stillinger 12/02/2019,8:17 AM

## 2019-12-02 NOTE — Progress Notes (Signed)
Family Medicine Teaching Service Daily Progress Note Intern Pager: [phone]  Patient name: Eric Castillo. Medical record number: [id number] Date of birth: 1975/11/17 Age: 44 y.o. 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  SBO/fistula in setting of crohn's disease- POD#4 Ex-Lap/ileocecectomy/appendicectomy Pain well controlled with Dilaudid PCA and tylenol x1. Vss, afebrile >24hrs. TPN via PICC starting 4/1. Phosphorus 4.5 today, K 3.8, glucose in low 100 range, given 1u novolog overnight. Blood sugars have been very reasonable, would suggest decreasing nighttime CBG checks for patient comfort.  NG to suction with 2.2L output yesterday.   -Surgery following, appreciate recommendations -Continue TPN 22m/hr, N/S 323mhr -IV Zosyn (03/31-) -NPO -Chewing gum PRN -intake and output -PT/OT -OOB  FEN/GI: TPN, NPO PPx: Lovenox  Disposition: Home when cleared by surgery, ileus resolved.   Subjective:  Patient denies abdominal pain but has very empty feeling in his abdomen. Is getting frustrated with all the tubing/wires he's hooked up to. Not passing gas or BM. Plans to get up and walk today. Pleasant overall.  Objective: Temp:  [98 F (36.7 C)-98.7 F (37.1 C)] 98 F (36.7 C) (04/04 0501) Pulse Rate:  [83-91] 85 (04/04 0501) Resp:  [12-19] 12 (04/04 0713) BP: (128-143)/(83-93) 128/85 (04/04 0501) SpO2:  [97 %-100 %] 98 % (04/04 0713) Physical Exam:  General: Alert and orientated, pleasant, NAD Gastrointestinal: Surgical incision abdominal dressing remains dry and intact.  Left JP site clean. Neg BS. NG tube in place and suctioning.  Extremities: No lower extremity edema, moves all  extremities. Psych: mood and affect wnl  Laboratory: Recent Labs  Lab 11/29/19 1431 11/30/19 0533 12/01/19 0500  WBC 17.5* 12.5* 10.2  HGB 12.6* 11.0* 9.8*  HCT 39.3 34.8* 29.9*  PLT 306 290 263   Recent Labs  Lab 11/27/19 0258 11/27/19 0258 11/28/19 0422 11/29/19 0220 11/30/19 0533 12/01/19 0500 12/03/19 0313  NA 140   < > 137   < > 135 135 141  K 3.5   < > 4.1   < > 4.3 3.2* 3.8  CL 97*   < > 102   < > 98 96* 103  CO2 29   < > 24   < > 26 28 26   BUN 16   < > 13   < > 9 6 22*  CREATININE 0.91   < > 0.82   < > 0.84 0.84 0.76  CALCIUM 9.4   < > 9.2   < > 9.2 8.7* 9.2  PROT 6.8  --  6.0*  --  6.0*  --   --   BILITOT 0.8  --  0.9  --  0.6  --   --   ALKPHOS 60  --  57  --  53  --   --   ALT 22  --  16  --  13  --   --   AST 17  --  12*  --  15  --   --   GLUCOSE 99   < > 94   < > 116* 132* 105*   < > = values in this interval not displayed.    Imaging/Diagnostic Tests:   AnRicharda OsmondDO 12/03/2019, 7:50 AM PGY-2, [place]  Intern pager: [phone], text pages welcome

## 2019-12-03 LAB — TYPE AND SCREEN
ABO/RH(D): O POS
Antibody Screen: NEGATIVE
Unit division: 0
Unit division: 0

## 2019-12-03 LAB — GLUCOSE, CAPILLARY
Glucose-Capillary: 110 mg/dL — ABNORMAL HIGH (ref 70–99)
Glucose-Capillary: 117 mg/dL — ABNORMAL HIGH (ref 70–99)
Glucose-Capillary: 123 mg/dL — ABNORMAL HIGH (ref 70–99)
Glucose-Capillary: 123 mg/dL — ABNORMAL HIGH (ref 70–99)
Glucose-Capillary: 128 mg/dL — ABNORMAL HIGH (ref 70–99)

## 2019-12-03 LAB — BASIC METABOLIC PANEL
Anion gap: 12 (ref 5–15)
BUN: 22 mg/dL — ABNORMAL HIGH (ref 6–20)
CO2: 26 mmol/L (ref 22–32)
Calcium: 9.2 mg/dL (ref 8.9–10.3)
Chloride: 103 mmol/L (ref 98–111)
Creatinine, Ser: 0.76 mg/dL (ref 0.61–1.24)
GFR calc Af Amer: >60 mL/min (ref >60)
GFR calc non Af Amer: >60 mL/min (ref >60)
Glucose, Bld: 105 mg/dL — ABNORMAL HIGH (ref 70–99)
Potassium: 3.8 mmol/L (ref 3.5–5.1)
Sodium: 141 mmol/L (ref 135–145)

## 2019-12-03 LAB — BPAM RBC
Blood Product Expiration Date: 202105032359
Blood Product Expiration Date: 202105032359
Unit Type and Rh: 5100
Unit Type and Rh: 5100

## 2019-12-03 LAB — PHOSPHORUS: Phosphorus: 4.5 mg/dL (ref 2.5–4.6)

## 2019-12-03 MED ORDER — TRAVASOL 10 % IV SOLN
INTRAVENOUS | Status: AC
  Filled 2019-12-03: qty 1203.36

## 2019-12-03 MED ORDER — INSULIN ASPART 100 UNIT/ML ~~LOC~~ SOLN
0.0000 [IU] | Freq: Four times a day (QID) | SUBCUTANEOUS | Status: DC
  Administered 2019-12-03 – 2019-12-04 (×2): 1 [IU] via SUBCUTANEOUS
  Administered 2019-12-04: 2 [IU] via SUBCUTANEOUS
  Administered 2019-12-04 – 2019-12-05 (×2): 1 [IU] via SUBCUTANEOUS

## 2019-12-03 NOTE — Progress Notes (Signed)
     Assessment & Plan: CROHN'S DISEASE WITH SMALL BOWEL STRICTURE AND FISTULA POD#4 -S/pEXPLORATORY LAPAROTOMY,ILEOCECECTOMY,APPENDECTOMY3/31 Dr. Ninfa Linden - expect prolonged ileus, continue NPO/NGT to LIWS and await return in bowel function -TPN for nutritional support while NPO - continue IV zosyn - continue JP drain and monitor output - mobilize, PT/OT  ID -zosyn 3/31>> FEN -IVF, NPO/NGT to LIWS, TPN VTE -SCDs, Lovenox Follow up -Dr. Ninfa Linden, GI        Armandina Gemma, MD       [facility]       Office: [phone]   Chief Complaint: Crohn's disease  Subjective: Patient in bed, comfortable.  Has ambulated.  No flatus or BM.  Objective: Vital signs in last 24 hours: Temp:  [98 F (36.7 C)-98.7 F (37.1 C)] 98.4 F (36.9 C) (04/04 0815) Pulse Rate:  [83-91] 88 (04/04 0815) Resp:  [12-19] 16 (04/04 0815) BP: (128-143)/(85-93) 128/92 (04/04 0815) SpO2:  [97 %-100 %] 99 % (04/04 0815) Last BM Date: 11/25/19  Intake/Output from previous day: 04/03 0701 - 04/04 0700 In: 3449.7 [I.V.:3083.4; IV Piggyback:366.3] Out: 2765 [Urine:550; Emesis/NG output:2200; Drains:15] Intake/Output this shift: No intake/output data recorded.  Physical Exam: HEENT - sclerae clear, mucous membranes moist Neck - soft Abdomen - soft, quiet; dressing intact; JP with small serous Ext - no edema, non-tender Neuro - alert & oriented, no focal deficits  Lab Results:  Recent Labs    12/01/19 0500  WBC 10.2  HGB 9.8*  HCT 29.9*  PLT 263   BMET Recent Labs    12/01/19 0500 12/03/19 0313  NA 135 141  K 3.2* 3.8  CL 96* 103  CO2 28 26  GLUCOSE 132* 105*  BUN 6 22*  CREATININE 0.84 0.76  CALCIUM 8.7* 9.2   PT/INR No results for input(s): LABPROT, INR in the last 72 hours. Comprehensive Metabolic Panel:    Component Value Date/Time   NA 141 12/03/2019 0313   NA 135 12/01/2019 0500   K 3.8 12/03/2019 0313   K 3.2 (L) 12/01/2019 0500   CL 103  12/03/2019 0313   CL 96 (L) 12/01/2019 0500   CO2 26 12/03/2019 0313   CO2 28 12/01/2019 0500   BUN 22 (H) 12/03/2019 0313   BUN 6 12/01/2019 0500   CREATININE 0.76 12/03/2019 0313   CREATININE 0.84 12/01/2019 0500   GLUCOSE 105 (H) 12/03/2019 0313   GLUCOSE 132 (H) 12/01/2019 0500   CALCIUM 9.2 12/03/2019 0313   CALCIUM 8.7 (L) 12/01/2019 0500   AST 15 11/30/2019 0533   AST 12 (L) 11/28/2019 0422   ALT 13 11/30/2019 0533   ALT 16 11/28/2019 0422   ALKPHOS 53 11/30/2019 0533   ALKPHOS 57 11/28/2019 0422   BILITOT 0.6 11/30/2019 0533   BILITOT 0.9 11/28/2019 0422   PROT 6.0 (L) 11/30/2019 0533   PROT 6.0 (L) 11/28/2019 0422   ALBUMIN 2.8 (L) 11/30/2019 0533   ALBUMIN 2.6 (L) 11/28/2019 0422    Studies/Results: No results found.    Armandina Gemma 12/03/2019  Patient ID: Eric Acres., male   DOB: [birth year], 44 y.o.   MRN: [id number]

## 2019-12-03 NOTE — Progress Notes (Signed)
PHARMACY - TOTAL PARENTERAL NUTRITION CONSULT NOTE   Indication: Prolonged ileus  Patient Measurements: Height: 6' (182.9 cm) Weight: 61.2 kg (134 lb 14.7 oz) IBW/kg (Calculated) : 77.6 TPN AdjBW (KG): 61.2 Body mass index is 18.3 kg/m.  Assessment: 44 year old gentleman with history of Crohn's disease, cannabis use and asthma presenting with several days of abdominal pain. Last bowel movement was 2 days PTA. 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 S/p Ex-Lap/ileocecectomy/appendicectomy 3/31. Anticipate a prolonged post-op ileus.  Glucose / Insulin: CBGs all <123 , 2 units SSI (GIF 3.66) Electrolytes: WNL except K 3.8 (goal>/= 4 with ileus) (Ca*Phos product 45, goal <55). Watch Phos 2.9>4.5 Renal: Scr 0.76 (UOP 0.4) LFTs / TGs: LFTs + Tbili WNL, TG 81 Prealbumin / albumin: Prealbumin 15, albumin 2.8 Intake / Output; MIVF: UOP 0.4, NG O/P 2200, drain O/P 15 down  - NS at 51m/hr; LBM 3/27 GI Imaging: 3/29 CT abd - Active Crohn's enteritis with chronic, partial small bowel obstruction at the distal ileum. 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 marked on coronal reformats Surgeries / Procedures:  3/31 Exlap, ileocecectomy, appendectomy  Central access: PICC placed 4/1 TPN start date: 4/1  Nutritional Goals (per RD recommendation on 4/1): KCal: 2250-2450, Protein: 120-135g, Fluid >2.2L Goal TPN rate is 92 mL/hr (provides 120 g of protein and 2285 kcals per day)  Current Nutrition:  TPN  Chewing gum ordered  Plan:  Con't TPN at goal rate of 952mhr at 1800 - provides 120g protein, 322g dextrose, 71g lipid and 2285kcal meeting 100% of patient needs Electrolytes in TPN: no change Add standard MVI MWF due to national shortage and trace elements to TPN Decrease SSI and CBGs to q6hr. Monitor TPN labs on Mon/Thurs and prn   Camika Marsico S. RoAlford HighlandPharmD, BCPS Clinical Staff  Pharmacist [email or web address]  RoAlford HighlandCrystal Stillinger 12/03/2019,7:53 AM

## 2019-12-04 DIAGNOSIS — K567 Ileus, unspecified: Secondary | ICD-10-CM

## 2019-12-04 DIAGNOSIS — Z4659 Encounter for fitting and adjustment of other gastrointestinal appliance and device: Secondary | ICD-10-CM

## 2019-12-04 DIAGNOSIS — K9189 Other postprocedural complications and disorders of digestive system: Secondary | ICD-10-CM

## 2019-12-04 LAB — CBC
HCT: 35.2 % — ABNORMAL LOW (ref 39.0–52.0)
Hemoglobin: 11.2 g/dL — ABNORMAL LOW (ref 13.0–17.0)
MCH: 28.4 pg (ref 26.0–34.0)
MCHC: 31.8 g/dL (ref 30.0–36.0)
MCV: 89.3 fL (ref 80.0–100.0)
Platelets: 510 10*3/uL — ABNORMAL HIGH (ref 150–400)
RBC: 3.94 MIL/uL — ABNORMAL LOW (ref 4.22–5.81)
RDW: 19 % — ABNORMAL HIGH (ref 11.5–15.5)
WBC: 17.8 10*3/uL — ABNORMAL HIGH (ref 4.0–10.5)
nRBC: 0 % (ref 0.0–0.2)

## 2019-12-04 LAB — DIFFERENTIAL
Abs Immature Granulocytes: 0.54 10*3/uL — ABNORMAL HIGH (ref 0.00–0.07)
Basophils Absolute: 0.1 10*3/uL (ref 0.0–0.1)
Basophils Relative: 1 %
Eosinophils Absolute: 0.5 10*3/uL (ref 0.0–0.5)
Eosinophils Relative: 3 %
Immature Granulocytes: 3 %
Lymphocytes Relative: 5 %
Lymphs Abs: 0.9 10*3/uL (ref 0.7–4.0)
Monocytes Absolute: 1.2 10*3/uL — ABNORMAL HIGH (ref 0.1–1.0)
Monocytes Relative: 7 %
Neutro Abs: 14.6 10*3/uL — ABNORMAL HIGH (ref 1.7–7.7)
Neutrophils Relative %: 81 %

## 2019-12-04 LAB — TRIGLYCERIDES: Triglycerides: 88 mg/dL (ref <150)

## 2019-12-04 LAB — COMPREHENSIVE METABOLIC PANEL
ALT: 20 U/L (ref 0–44)
AST: 19 U/L (ref 15–41)
Albumin: 2.6 g/dL — ABNORMAL LOW (ref 3.5–5.0)
Alkaline Phosphatase: 124 U/L (ref 38–126)
Anion gap: 13 (ref 5–15)
BUN: 25 mg/dL — ABNORMAL HIGH (ref 6–20)
CO2: 28 mmol/L (ref 22–32)
Calcium: 9.6 mg/dL (ref 8.9–10.3)
Chloride: 104 mmol/L (ref 98–111)
Creatinine, Ser: 0.77 mg/dL (ref 0.61–1.24)
GFR calc Af Amer: >60 mL/min (ref >60)
GFR calc non Af Amer: >60 mL/min (ref >60)
Glucose, Bld: 109 mg/dL — ABNORMAL HIGH (ref 70–99)
Potassium: 4.1 mmol/L (ref 3.5–5.1)
Sodium: 145 mmol/L (ref 135–145)
Total Bilirubin: 0.5 mg/dL (ref 0.3–1.2)
Total Protein: 6.8 g/dL (ref 6.5–8.1)

## 2019-12-04 LAB — GLUCOSE, CAPILLARY
Glucose-Capillary: 126 mg/dL — ABNORMAL HIGH (ref 70–99)
Glucose-Capillary: 131 mg/dL — ABNORMAL HIGH (ref 70–99)
Glucose-Capillary: 106 mg/dL — ABNORMAL HIGH (ref 70–99)
Glucose-Capillary: 127 mg/dL — ABNORMAL HIGH (ref 70–99)

## 2019-12-04 LAB — PREALBUMIN: Prealbumin: 17.3 mg/dL — ABNORMAL LOW (ref 18–38)

## 2019-12-04 LAB — PHOSPHORUS: Phosphorus: 4 mg/dL (ref 2.5–4.6)

## 2019-12-04 LAB — MAGNESIUM: Magnesium: 2.2 mg/dL (ref 1.7–2.4)

## 2019-12-04 MED ORDER — ACETAMINOPHEN 10 MG/ML IV SOLN
1000.0000 mg | Freq: Four times a day (QID) | INTRAVENOUS | Status: AC
  Administered 2019-12-04 – 2019-12-05 (×4): 1000 mg via INTRAVENOUS
  Filled 2019-12-04 (×5): qty 100

## 2019-12-04 MED ORDER — TRAVASOL 10 % IV SOLN
INTRAVENOUS | Status: AC
  Filled 2019-12-04: qty 1280.64

## 2019-12-04 MED ORDER — PANTOPRAZOLE SODIUM 40 MG IV SOLR
40.0000 mg | INTRAVENOUS | Status: DC
  Administered 2019-12-04 – 2019-12-07 (×4): 40 mg via INTRAVENOUS
  Filled 2019-12-04 (×4): qty 40

## 2019-12-04 MED ORDER — TRAVASOL 10 % IV SOLN
INTRAVENOUS | Status: DC
  Filled 2019-12-04: qty 501.4

## 2019-12-04 NOTE — Progress Notes (Signed)
Patient ID: Eric Mehra., male   DOB: 08-22-1976, 44 y.o.   MRN: [id number]    5 Days Post-Op  Subjective: Patient with no flatus still.  Says he is up and mobilizing.  Pain is controlled on PCA  ROS: See above, otherwise other systems negative  Objective: Vital signs in last 24 hours: Temp:  [98.3 F (36.8 C)-98.9 F (37.2 C)] 98.3 F (36.8 C) (04/05 0314) Pulse Rate:  [90-105] 90 (04/05 0314) Resp:  [10-18] 15 (04/05 0844) BP: (120-150)/(87-101) 120/87 (04/05 0314) SpO2:  [95 %-100 %] 97 % (04/05 0844) Last BM Date: 11/25/19  Intake/Output from previous day: 04/04 0701 - 04/05 0700 In: 959.6 [I.V.:859.6; IV Piggyback:100] Out: 2500 [Urine:700; Emesis/NG output:1800] Intake/Output this shift: Total I/O In: -  Out: 500 [Emesis/NG output:500]  PE: Abd: soft, appropriately tender, ND, hypoactive BS, NGT with dark bilious, slight blood tinged output, 1800cc yesterday.  Midline wound is clean and packed.  JP drain with minimal serous output.  Lab Results:  Recent Labs    12/04/19 0410  WBC 17.8*  HGB 11.2*  HCT 35.2*  PLT 510*   BMET Recent Labs    12/03/19 0313 12/04/19 0410  NA 141 145  K 3.8 4.1  CL 103 104  CO2 26 28  GLUCOSE 105* 109*  BUN 22* 25*  CREATININE 0.76 0.77  CALCIUM 9.2 9.6   PT/INR No results for input(s): LABPROT, INR in the last 72 hours. CMP     Component Value Date/Time   NA 145 12/04/2019 0410   K 4.1 12/04/2019 0410   CL 104 12/04/2019 0410   CO2 28 12/04/2019 0410   GLUCOSE 109 (H) 12/04/2019 0410   BUN 25 (H) 12/04/2019 0410   CREATININE 0.77 12/04/2019 0410   CALCIUM 9.6 12/04/2019 0410   PROT 6.8 12/04/2019 0410   ALBUMIN 2.6 (L) 12/04/2019 0410   AST 19 12/04/2019 0410   ALT 20 12/04/2019 0410   ALKPHOS 124 12/04/2019 0410   BILITOT 0.5 12/04/2019 0410   GFRNONAA >60 12/04/2019 0410   GFRAA >60 12/04/2019 0410   Lipase     Component Value Date/Time   LIPASE 25 11/27/2019 0258       Studies/Results: No  results found.  Anti-infectives: Anti-infectives (From admission, onward)   Start     Dose/Rate Route Frequency Ordered Stop   11/29/19 1500  piperacillin-tazobactam (ZOSYN) IVPB 3.375 g     3.375 g 12.5 mL/hr over 240 Minutes Intravenous Every 8 hours 11/29/19 1345     11/29/19 0600  ceFAZolin (ANCEF) IVPB 2g/100 mL premix     2 g 200 mL/hr over 30 Minutes Intravenous On call to O.R. 11/28/19 1334 11/29/19 0825       Assessment/Plan Tobacco abuse - 1 black and mild daily Asthma - albuterol inhaler PRN ABL anemia - stable Malnutrition - prealbumin 17/TNA  POD 5, s/p ex lap with ileocecectomy, appendectomy, DB 3/31 for crohn's disease with small bowel stricture and fistula - surgical path c/w crohn's disease - expect prolonged ileus, continue NPO/NGT to LIWS and await return in bowel function. Starting TPN for nutritional support while NPO - continue IV zosyn, WBC up to 17K today from 10K.  Will follow - continue JP drain and monitor output - mobilize, IS - continue scheduled IV tylenol and robaxin for pain control, dilaudid PCA -add protonix due to some old bloody drainage noted in NGT  ID - zosyn 3/31>> FEN - IVF, NPO/NGT to LIWS, TPN VTE - SCDs, lovenox  Foley - d/c 4/1 Follow up - Dr. Ninfa Linden, GI   LOS: 7 days    Henreitta Cea , [facility] 12/04/2019, 9:17 AM Please see Amion for pager number during day hours 7:00am-4:30pm or 7:00am -11:30am on weekends

## 2019-12-04 NOTE — Plan of Care (Signed)

## 2019-12-04 NOTE — Hospital Course (Addendum)
Eric Castillo. is a 44 y.o. male with history of Crohn's disease, cannabis use and asthma, presenting with 2 to 3-day history of intermittent severe abdominal pain, constipation, vomiting.  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  His hospital course is outlined below.   SBO with Crohn's Disease Admission details can be found in H&P.  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  Patient started on standard SBO protocol.  GI consulted given patient's severe Crohn's, who recommended Solumedrol and surgery consultation for consideration of surgical resection.  Surgery was consulted and recommended exploratory laparotomy for ileocecectomy.  Patient was taken to the OR on 3/31 for Ex Lap with ileocecetomy, appendectomy.  On POD#5 he had BM.  Patient was on TPN until 4/9 when he successfully transitioned to liquid diet. Per surgery's recommendations, patient was continued on IV Zosyn from 3/31 until 12/10/19. His WBC continued to increase  to a max of 21 and so abdominal and pelvic CT was completed and showed two fluid collections in his pelvis. The patient was taken to the OR for an attempt to place drains for  the collections of fluid by IR on 12/06/19 but the collections had decreased in size so no drains were placed. The patient was afebrile and his white blood cell count normalized to 9.9 by the time of discharge so he was continued on IV Zocysn and transitioned to PO Augmentin on 4/10 for a total of 2 days. Augmentin was not continued at the time of discharge per surgery's recommendation.

## 2019-12-04 NOTE — Progress Notes (Signed)
PHARMACY - TOTAL PARENTERAL NUTRITION CONSULT NOTE   Indication: Prolonged ileus  Patient Measurements: Height: 6' (182.9 cm) Weight: 61.2 kg (134 lb 14.7 oz) IBW/kg (Calculated) : 77.6 TPN AdjBW (KG): 61.2 Body mass index is 18.3 kg/m.  Assessment: 44 year old gentleman with history of Crohn's disease, cannabis use and asthma presenting with several days of abdominal pain. Last bowel movement was 2 days PTA. 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 S/p Ex-Lap/ileocecectomy/appendicectomy 3/31. Anticipate a prolonged post-op ileus.  4/5 note - TPN order inadvertently entered incorrectly 4/4 (timing of hang vs dc), but TPN is currently hanging correctly (and has been all night)  Glucose / Insulin: CBGs all <140 , 3 units SSI since last bag hung (only 2 w prior bag) Electrolytes: WNL ( K goal>/= 4; Mg >/= 2 with ileus) (Ca*Phos product 38.4, goal <55). Phos WNL at 4 today Renal: Scr 0.76 (UOP 0.4) LFTs / TGs: LFTs + Tbili WNL, TG 88 Prealbumin / albumin: Prealbumin 15>17.3, albumin 2.6 Intake / Output; MIVF: UOP 0.5 mL/kg/hr, NG O/P 2200>1800, no drain output - NS at 73m/hr; LBM 3/27 GI Imaging: 3/29 CT abd - Active Crohn's enteritis with chronic, partial small bowel obstruction at the distal ileum. 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 marked on coronal reformats Surgeries / Procedures:  3/31 Exlap, ileocecectomy, appendectomy  Central access: PICC placed 4/1 TPN start date: 4/1  Nutritional Goals (per RD recommendation on 4/1): KCal: 2250-2450, Protein: 120-135g, Fluid >2.2L Goal TPN rate is 92 mL/hr (provides 120 g of protein and 2285 kcals per day)  Current Nutrition:  TPN  Chewing gum ordered  Plan:  Continue TPN at goal rate of 968mhr at 1800 - will increase protein    This TPN provides 128g protein, 322g dextrose, 71g lipid and 2316kcal           meeting 100% of patient needs Electrolytes in  TPN: no change Add standard MVI MWF due to national shortage and trace elements to TPN Continue decreased SSI and CBGs to q6hr. Monitor TPN labs on Mon/Thurs and prn  Eric Castillo, Eric Castillo, Eric Castillo Clinical Pharmacist [phone] check AMION for all [place] and [place]  12/04/2019 7:58 AM

## 2019-12-04 NOTE — Progress Notes (Signed)
Family Medicine Teaching Service Daily Progress Note Intern Pager: [phone]  Patient name: Eric Castillo. Medical record number: [id number] Date of birth: [birth year] Age: 44 y.o. 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  SBO/ Fistula in setting of crohn's disease- POD#5 Ex-Lap/ileocecectomy/appendicectomy Pain well controlled with Dilaudid PCA and tylenol x1. Vss, afebrile >24hrs. TPN via PICC started on 4/1.  NG to suction with 1.8L output yesterday. Patient reports that his pain is well controlled with PCA pump. Reports he has been doing well with taking walks and getting out of bed frequently.  Patient with increased white blood cell count to 17 from 10.2. Would consider decreasing PCA if it would help with ileus. -Surgery following, appreciate recommendations -Continue TPN 59m/hr, N/S 355mhr -IV Zosyn (03/31-), day 6  -N.p.o. except for ice chips  -Chewing gum PRN -monitoring intake and output -PT/OT -OOB -Monitor CBC  FEN/GI: TPN, NPO (allowed to have ice chips)  PPx: Lovenox  Disposition: anticipate discharge home when cleared by surgery and bowel functioning restored   Subjective:  Patient remains pleasant and has not had bowel movement nor passing flatus. Pain is well controlled with PCA.  Objective: Temp:  [98.2 F (36.8 C)-98.9 F (37.2 C)] 98.2 F (36.8 C) (04/05 1027) Pulse Rate:  [84-105] 84 (04/05 1027) Resp:  [10-18] 14 (04/05 1027) BP: (120-150)/(87-101) 138/93 (04/05 1027) SpO2:  [97 %-100 %] 100 % (04/05 1027)  Physical Exam:  General: Alert and orientated, NAD, conversational  Gastrointestinal: Surgical incision abdominal dressing remains  dry and intact, dressing changed prior to my exam this AM.  Left JP site clean. No bowel sounds. NG tube in place and suctioning.  Extremities: No lower extremity edema, moves all extremities.  Laboratory: Recent Labs  Lab 11/30/19 0533 12/01/19 0500 12/04/19 0410  WBC 12.5* 10.2 17.8*  HGB 11.0* 9.8* 11.2*  HCT 34.8* 29.9* 35.2*  PLT 290 263 510*   Recent Labs  Lab 11/28/19 0422 11/29/19 0220 11/30/19 0533 11/30/19 0533 12/01/19 0500 12/03/19 0313 12/04/19 0410  NA 137   < > 135   < > 135 141 145  K 4.1   < > 4.3   < > 3.2* 3.8 4.1  CL 102   < > 98   < > 96* 103 104  CO2 24   < > 26   < > 28 26 28   BUN 13   < > 9   < > 6 22* 25*  CREATININE 0.82   < > 0.84   < > 0.84 0.76 0.77  CALCIUM 9.2   < > 9.2   < > 8.7* 9.2 9.6  PROT 6.0*  --  6.0*  --   --   --  6.8  BILITOT 0.9  --  0.6  --   --   --  0.5  ALKPHOS 57  --  53  --   --   --  124  ALT 16  --  13  --   --   --  20  AST 12*  --  15  --   --   --  19  GLUCOSE 94   < > 116*   < >  132* 105* 109*   < > = values in this interval not displayed.    Imaging/Diagnostic Tests: No results found.  Stark Klein, MD 12/04/2019, 12:02 PM PGY-2, [place] Intern pager: [phone], text pages welcome

## 2019-12-04 NOTE — Progress Notes (Signed)
Occupational Therapy Treatment Patient Details Name: Eric Castillo. MRN: [id number] DOB: [birth year] Today's Date: 12/04/2019    History of present illness Pt is 44 yo male presenting to ED with 2-3 day hx of abd/pain.  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 Pt is s/p exp lap, ileocecectomy, appendcectomy on 11/29/19 - note fascia close but skin left open with dressing.  Pt with hx of Crohn' disease.   OT comments  Pt making good progress in therapy, demonstrating improved activity tolerance and independence with ADLs. Continued education with pt on importance of daily activity with good understanding and follow through. Pt frustrated with current length of recovery and minimal bowel activity. Pt tolerated standing 1 x 17 min with setup/supervision while engaging in total body bathing, dressing, and toileting task. Noted 0 instances of LOB. No reports of fatigue following. OT will continue to follow acutely.    Follow Up Recommendations  No OT follow up;Supervision - Intermittent    Equipment Recommendations  None recommended by OT    Recommendations for Other Services      Precautions / Restrictions Precautions Precautions: Fall Precaution Comments: NG tube, JP drain Restrictions Weight Bearing Restrictions: No       Mobility Bed Mobility Overal bed mobility: Modified Independent             General bed mobility comments: HOB elevated, use of bed rail  Transfers Overall transfer level: Needs assistance Equipment used: None Transfers: Sit to/from Stand Sit to Stand: Supervision         General transfer comment: for safety and line management    Balance Overall balance assessment: No apparent balance deficits (not formally assessed)                                         ADL either performed or assessed with clinical judgement   ADL Overall ADL's : Needs assistance/impaired     Grooming: Set up;Standing   Upper Body  Bathing: Set up;Standing   Lower Body Bathing: Set up;Sit to/from stand       Lower Body Dressing: Set up;Sit to/from stand               Functional mobility during ADLs: Supervision/safety General ADL Comments: Pt tolerated standing 1 x 17 min while engaging in self-care tasks. Noted 0 instances of LOB.      Vision       Perception     Praxis      Cognition Arousal/Alertness: Awake/alert Behavior During Therapy: WFL for tasks assessed/performed Overall Cognitive Status: Within Functional Limits for tasks assessed                                 General Comments: Pt frustrated during session due to NG tube and inability to pass gas or have a BM        Exercises     Shoulder Instructions       General Comments Pt agreeable to therapy, but frustrated with how long recovery is taking.     Pertinent Vitals/ Pain       Pain Assessment: 0-10 Pain Score: 5  Pain Location: throat from NG tube Pain Descriptors / Indicators: Grimacing;Discomfort Pain Intervention(s): Monitored during session  Home Living  Prior Functioning/Environment              Frequency           Progress Toward Goals  OT Goals(current goals can now be found in the care plan section)  Progress towards OT goals: Progressing toward goals  ADL Goals Pt Will Perform Grooming: Independently;standing Pt Will Perform Lower Body Dressing: Independently;sit to/from stand Pt Will Transfer to Toilet: Independently;ambulating Pt Will Perform Tub/Shower Transfer: Tub transfer;Shower transfer;ambulating;Independently  Plan Discharge plan remains appropriate    Co-evaluation                 AM-PAC OT "6 Clicks" Daily Activity     Outcome Measure   Help from another person eating meals?: Total(NPO) Help from another person taking care of personal grooming?: A Little Help from another person toileting, which  includes using toliet, bedpan, or urinal?: A Little Help from another person bathing (including washing, rinsing, drying)?: A Little Help from another person to put on and taking off regular upper body clothing?: A Little Help from another person to put on and taking off regular lower body clothing?: A Little 6 Click Score: 16    End of Session    OT Visit Diagnosis: Pain Pain - part of body: (throat)   Activity Tolerance Patient tolerated treatment well   Patient Left Other (comment)(Seated EOB with call light and tray table in reach)   Nurse Communication Mobility status        Time: 9826-4158 OT Time Calculation (min): 24 min  Charges: OT General Charges $OT Visit: 1 Visit OT Treatments $Self Care/Home Management : 8-22 mins $Therapeutic Activity: 8-22 mins  Mauri Brooklyn OTR/L [phone]   Mauri Brooklyn 12/04/2019, 3:24 PM

## 2019-12-04 NOTE — Progress Notes (Signed)
Physical Therapy Treatment Patient Details Name: Eric Castillo. MRN: [id number] DOB: [birth year] Today's Date: 12/04/2019    History of Present Illness Pt is 44 yo male presenting to ED with 2-3 day hx of abd/pain.  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 Pt is s/p exp lap, ileocecectomy, appendcectomy on 11/29/19 - note fascia close but skin left open with dressing.  Pt with hx of Crohn' disease.    PT Comments    Patiett ambulated 700' pushing 2 IV poles. HR up to 142, SPO2 95%. Patient reports feeling the need to pass gas but cannot. Continue to progress activity as tolerated.  Follow Up Recommendations  No PT follow up     Equipment Recommendations  None recommended by PT    Recommendations for Other Services       Precautions / Restrictions Precautions Precautions: Fall Precaution Comments: NG tube, JP drain Restrictions Weight Bearing Restrictions: No    Mobility  Bed Mobility Overal bed mobility: Modified Independent         Sit to supine: Modified independent (Device/Increase time)   General bed mobility comments: HOB elevated, use of bed rail  Transfers Overall transfer level: Needs assistance Equipment used: None Transfers: Sit to/from Stand Sit to Stand: Supervision Stand pivot transfers: Supervision       General transfer comment: for safety and line management  Ambulation/Gait Ambulation/Gait assistance: Supervision Gait Distance (Feet): 700 Feet Assistive device: IV Pole Gait Pattern/deviations: Decreased stride length;Trunk flexed Gait velocity: decreased   General Gait Details: Supervision for safety and lines, held onto 2 IV poles   Stairs             Wheelchair Mobility    Modified Rankin (Stroke Patients Only)       Balance Overall balance assessment: No apparent balance deficits (not formally assessed)                                          Cognition Arousal/Alertness:  Awake/alert Behavior During Therapy: WFL for tasks assessed/performed Overall Cognitive Status: Within Functional Limits for tasks assessed                                 General Comments: Pt frustrated during session due to NG tube and inability to pass gas or have a BM      Exercises      General Comments General comments (skin integrity, edema, etc.): Pt agreeable to therapy, but frustrated with how long recovery is taking.       Pertinent Vitals/Pain Pain Assessment: 0-10 Pain Score: 5  Faces Pain Scale: Hurts even more Pain Location: cramping in abd. Pain Descriptors / Indicators: Cramping Pain Intervention(s): Monitored during session    Home Living                      Prior Function            PT Goals (current goals can now be found in the care plan section) Progress towards PT goals: Progressing toward goals    Frequency    Min 3X/week      PT Plan Current plan remains appropriate    Co-evaluation              AM-PAC PT "6 Clicks" Mobility   Outcome Measure  Help needed turning from your back to your side while in a flat bed without using bedrails?: None Help needed moving from lying on your back to sitting on the side of a flat bed without using bedrails?: None Help needed moving to and from a bed to a chair (including a wheelchair)?: None Help needed standing up from a chair using your arms (e.g., wheelchair or bedside chair)?: None Help needed to walk in hospital room?: None Help needed climbing 3-5 steps with a railing? : A Little 6 Click Score: 23    End of Session   Activity Tolerance: Patient tolerated treatment well Patient left: in bed;with call bell/phone within reach Nurse Communication: Mobility status PT Visit Diagnosis: Other abnormalities of gait and mobility (R26.89)     Time: 2725-3664 PT Time Calculation (min) (ACUTE ONLY): 31 min  Charges:  $Gait Training: 23-37 mins                      [facility] Pager [phone] Office [phone]    Claretha Cooper 12/04/2019, 4:17 PM

## 2019-12-04 NOTE — Progress Notes (Signed)
Abdominal wound dressing changed as ordered. Pt tolerated well. Will continue to monitor.

## 2019-12-05 ENCOUNTER — Inpatient Hospital Stay (HOSPITAL_COMMUNITY): Payer: Self-pay

## 2019-12-05 LAB — GLUCOSE, CAPILLARY
Glucose-Capillary: 110 mg/dL — ABNORMAL HIGH (ref 70–99)
Glucose-Capillary: 114 mg/dL — ABNORMAL HIGH (ref 70–99)
Glucose-Capillary: 130 mg/dL — ABNORMAL HIGH (ref 70–99)

## 2019-12-05 LAB — CBC
HCT: 33.8 % — ABNORMAL LOW (ref 39.0–52.0)
Hemoglobin: 11 g/dL — ABNORMAL LOW (ref 13.0–17.0)
MCH: 28.8 pg (ref 26.0–34.0)
MCHC: 32.5 g/dL (ref 30.0–36.0)
MCV: 88.5 fL (ref 80.0–100.0)
Platelets: 594 10*3/uL — ABNORMAL HIGH (ref 150–400)
RBC: 3.82 MIL/uL — ABNORMAL LOW (ref 4.22–5.81)
RDW: 18.8 % — ABNORMAL HIGH (ref 11.5–15.5)
WBC: 20.6 10*3/uL — ABNORMAL HIGH (ref 4.0–10.5)
nRBC: 0 % (ref 0.0–0.2)

## 2019-12-05 MED ORDER — IOHEXOL 9 MG/ML PO SOLN
ORAL | Status: AC
  Administered 2019-12-05: 10:00:00 500 mL
  Filled 2019-12-05: qty 1000

## 2019-12-05 MED ORDER — HYDROMORPHONE HCL 1 MG/ML IJ SOLN
0.5000 mg | INTRAMUSCULAR | Status: DC | PRN
  Administered 2019-12-07 – 2019-12-08 (×2): 0.5 mg via INTRAVENOUS
  Filled 2019-12-05 (×2): qty 1

## 2019-12-05 MED ORDER — OXYCODONE HCL 5 MG PO TABS
5.0000 mg | ORAL_TABLET | ORAL | Status: DC | PRN
  Administered 2019-12-05 – 2019-12-07 (×7): 5 mg via ORAL
  Administered 2019-12-08: 10 mg via ORAL
  Administered 2019-12-08: 5 mg via ORAL
  Administered 2019-12-09 – 2019-12-10 (×4): 10 mg via ORAL
  Administered 2019-12-10: 22:00:00 5 mg via ORAL
  Administered 2019-12-10 – 2019-12-11 (×2): 10 mg via ORAL
  Filled 2019-12-05 (×4): qty 2
  Filled 2019-12-05 (×2): qty 1
  Filled 2019-12-05: qty 2
  Filled 2019-12-05: qty 1
  Filled 2019-12-05 (×2): qty 2
  Filled 2019-12-05 (×2): qty 1
  Filled 2019-12-05: qty 2
  Filled 2019-12-05 (×2): qty 1
  Filled 2019-12-05: qty 2

## 2019-12-05 MED ORDER — IOHEXOL 300 MG/ML  SOLN
100.0000 mL | Freq: Once | INTRAMUSCULAR | Status: AC | PRN
  Administered 2019-12-05: 100 mL via INTRAVENOUS

## 2019-12-05 MED ORDER — INSULIN ASPART 100 UNIT/ML ~~LOC~~ SOLN: 0.0000 [IU] | Freq: Three times a day (TID) | SUBCUTANEOUS | Status: DC

## 2019-12-05 MED ORDER — TRAVASOL 10 % IV SOLN
INTRAVENOUS | Status: AC
  Filled 2019-12-05: qty 1280.64

## 2019-12-05 NOTE — Progress Notes (Signed)
Family Medicine Teaching Service Daily Progress Note Intern Pager: [phone]  Patient name: Eric Castillo. Medical record number: [id number] Date of birth: [birth year] Age: 44 y.o. 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  SBO / Fistula in setting of crohn's disease- POD#6 Ex-Lap/ileocecectomy/appendicectomy Pain well controlled with Dilaudid PCA and tylenol x1. Vss, afebrile >24hrs. TPN via PICC started on 4/1.  NG to suction with 1.8L output yesterday. Patient reports that his pain is well controlled with PCA pump. Reports he has been doing well with taking walks and getting out of bed frequently.  Patient with increased white blood cell count to 17 from 10.2.  -Surgery following, appreciate recommendations -Continue TPN  -IV Zosyn (03/31-),   -Chewing gum PRN -monitoring intake and output -PT/OT -OOB -Monitor CBC -surgery considering abd CT - monitor JP drain output  - d/c PCA, transition to PO pain medication if patient tolerates NGT clamping  FEN/GI: TPN, NPO (allowed to have ice chips)  PPx: Lovenox  Disposition: anticipate discharge home when cleared by surgery   Subjective:  Patient remains pleasant and has not had bowel movement nor passing flatus. Pain is well controlled with PCA.  Objective: Temp:  [97.8 F (36.6 C)-98.5 F (36.9 C)] 98.1 F (36.7 C) (04/06 1029) Pulse Rate:  [91-109] 109 (04/06 1029) Resp:  [12-18] 14 (04/06 1029) BP: (134-143)/(82-98) 138/98 (04/06 1029) SpO2:  [93 %-100 %] 98 % (04/06 1029)  Physical Exam:  General: Alert and cooperative and appears to be in no acute distress, sitting up in bed  Cardio: Normal S1 and S2, no S3 or  S4. Rhythm is regular. No murmurs or rubs.   Pulm: Clear to auscultation bilaterally, no crackles, wheezing, or diminished breath sounds. Normal respiratory effort, stable on RA  Abdomen: Bowel sounds normal. Abdomen soft and non-tender.  Extremities: No peripheral edema. Warm/ well perfused.   Neuro: alert and oriented x4  Laboratory: Recent Labs  Lab 12/01/19 0500 12/04/19 0410 12/05/19 0422  WBC 10.2 17.8* 20.6*  HGB 9.8* 11.2* 11.0*  HCT 29.9* 35.2* 33.8*  PLT 263 510* 594*   Recent Labs  Lab 11/30/19 0533 11/30/19 0533 12/01/19 0500 12/03/19 0313 12/04/19 0410  NA 135   < > 135 141 145  K 4.3   < > 3.2* 3.8 4.1  CL 98   < > 96* 103 104  CO2 26   < > 28 26 28   BUN 9   < > 6 22* 25*  CREATININE 0.84   < > 0.84 0.76 0.77  CALCIUM 9.2   < > 8.7* 9.2 9.6  PROT 6.0*  --   --   --  6.8  BILITOT 0.6  --   --   --  0.5  ALKPHOS 53  --   --   --  124  ALT 13  --   --   --  20  AST 15  --   --   --  19  GLUCOSE 116*   < > 132* 105* 109*   < > = values in this interval not displayed.    Imaging/Diagnostic Tests: No results found.  Stark Klein, MD 12/05/2019, 1:51 PM PGY-2,  [place] Intern pager: [phone], text pages welcome

## 2019-12-05 NOTE — Progress Notes (Signed)
[facility] Progress Note  6 Days Post-Op  Subjective: Patient reports he had a BM this AM, feeling better overall. Still having abdominal pain but not worsened. Does not want to continue using PCA, he stopped using it yesterday and attributes BM this AM to stopping this.    Objective: Vital signs in last 24 hours: Temp:  [97.8 F (36.6 C)-98.5 F (36.9 C)] 97.8 F (36.6 C) (04/06 0621) Pulse Rate:  [84-104] 94 (04/06 0621) Resp:  [12-18] 12 (04/06 0740) BP: (134-143)/(82-93) 143/91 (04/06 0621) SpO2:  [93 %-100 %] 99 % (04/06 0740) Last BM Date: 11/25/19  Intake/Output from previous day: 04/05 0701 - 04/06 0700 In: 3385 [P.O.:360; I.V.:2375; IV Piggyback:650] Out: 9211 [Urine:1975; Emesis/NG output:1650; Drains:5] Intake/Output this shift: No intake/output data recorded.  PE: General: pleasant, WD, thin male who is laying in bed in NAD Heart: regular, rate, and rhythm.  Normal s1,s2. No obvious murmurs, gallops, or rubs noted.  Palpable radial and pedal pulses bilaterally Lungs: CTAB, no wheezes, rhonchi, or rales noted.  Respiratory effort nonlabored Abd: soft, appropriately ttp, ND, +BS, midline wound clean, drain with small amount SS fluid MS: all 4 extremities are symmetrical with no cyanosis, clubbing, or edema. Psych: A&Ox3 with an appropriate affect.   Lab Results:  Recent Labs    12/04/19 0410 12/05/19 0422  WBC 17.8* 20.6*  HGB 11.2* 11.0*  HCT 35.2* 33.8*  PLT 510* 594*   BMET Recent Labs    12/03/19 0313 12/04/19 0410  NA 141 145  K 3.8 4.1  CL 103 104  CO2 26 28  GLUCOSE 105* 109*  BUN 22* 25*  CREATININE 0.76 0.77  CALCIUM 9.2 9.6   PT/INR No results for input(s): LABPROT, INR in the last 72 hours. CMP     Component Value Date/Time   NA 145 12/04/2019 0410   K 4.1 12/04/2019 0410   CL 104 12/04/2019 0410   CO2 28 12/04/2019 0410   GLUCOSE 109 (H) 12/04/2019 0410   BUN 25 (H) 12/04/2019 0410   CREATININE 0.77 12/04/2019  0410   CALCIUM 9.6 12/04/2019 0410   PROT 6.8 12/04/2019 0410   ALBUMIN 2.6 (L) 12/04/2019 0410   AST 19 12/04/2019 0410   ALT 20 12/04/2019 0410   ALKPHOS 124 12/04/2019 0410   BILITOT 0.5 12/04/2019 0410   GFRNONAA >60 12/04/2019 0410   GFRAA >60 12/04/2019 0410   Lipase     Component Value Date/Time   LIPASE 25 11/27/2019 0258       Studies/Results: No results found.  Anti-infectives: Anti-infectives (From admission, onward)   Start     Dose/Rate Route Frequency Ordered Stop   11/29/19 1500  piperacillin-tazobactam (ZOSYN) IVPB 3.375 g     3.375 g 12.5 mL/hr over 240 Minutes Intravenous Every 8 hours 11/29/19 1345     11/29/19 0600  ceFAZolin (ANCEF) IVPB 2g/100 mL premix     2 g 200 mL/hr over 30 Minutes Intravenous On call to O.R. 11/28/19 1334 11/29/19 0825       Assessment/Plan Tobacco abuse - 1 black and mild daily Asthma - albuterol inhaler PRN ABL anemia - stable Malnutrition - prealbumin 17/TNA  POD 6, s/p ex lap with ileocecectomy, appendectomy, DB 3/31 for crohn's disease with small bowel stricture and fistula - surgical path c/w crohn's disease - patient with BM this AM - clamp NGT - continue IV zosyn, WBC up to 20K - check CT A/P - continue JP drain and monitor output - 5 cc  in 24h, SS - mobilize, IS - d/c PCA and start transition to PO pain medication if patient tolerating NGT clamping  ID -zosyn 3/31>> FEN -IVF, NPO/NGT clamped, TPN VTE -SCDs, lovenox Foley -d/c 4/1 Follow up -Dr. Ninfa Linden, GI  LOS: 8 days    Brigid Re , [facility] 12/05/2019, 9:03 AM Please see Amion for pager number during day hours 7:00am-4:30pm

## 2019-12-05 NOTE — Plan of Care (Signed)

## 2019-12-05 NOTE — Progress Notes (Signed)
PHARMACY - TOTAL PARENTERAL NUTRITION CONSULT NOTE   Indication: Prolonged ileus  Patient Measurements: Height: 6' (182.9 cm) Weight: 61.2 kg (134 lb 14.7 oz) IBW/kg (Calculated) : 77.6 TPN AdjBW (KG): 61.2 Body mass index is 18.3 kg/m.  Assessment: 44 year old gentleman with history of Crohn's disease, cannabis use and asthma presenting with several days of abdominal pain. Last bowel movement was 2 days PTA. 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 S/p Ex-Lap/ileocecectomy/appendicectomy 3/31. Anticipate a prolonged post-op ileus.  4/5 note - TPN order inadvertently entered incorrectly 4/4 (timing of hang vs dc), but TPN is currently hanging correctly (and has been all night)  Glucose / Insulin: CBGs all <140 , 2 units SSI since last bag hung Electrolytes: WNL ( K goal>/= 4; Mg >/= 2 with ileus) Na still WNL but rising Renal: Scr 0.77 (UOP 0.4) LFTs / TGs: LFTs + Tbili WNL, TG 88 Prealbumin / albumin: Prealbumin 15>17.3, albumin 2.6 Intake / Output; MIVF: UOP 0.5 mL/kg/hr, NG O/P 2200>1800, no drain output - NS at 2m/hr; LBM 4/6 am GI Imaging: 3/29 CT abd - Active Crohn's enteritis with chronic, partial small bowel obstruction at the distal ileum. 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 marked on coronal reformats Surgeries / Procedures:  3/31 Exlap, ileocecectomy, appendectomy  Central access: PICC placed 4/1 TPN start date: 4/1  Nutritional Goals (per RD recommendation on 4/1): KCal: 2250-2450, Protein: 120-135g, Fluid >2.2L Goal TPN rate is 92 mL/hr (provides 120 g of protein and 2285 kcals per day)  Current Nutrition:  TPN  Chewing gum ordered  Plan:  Continue TPN at goal rate of 981mhr at 1800 - will increase protein    This TPN provides 128g protein, 322g dextrose, 71g lipid and 2316kcal           meeting 100% of patient needs Electrolytes in TPN: no change Add standard MVI MWF due to  national shortage and trace elements to TPN Reduce SSI and CBGs to q8h. Monitor TPN labs on Mon/Thurs and prn Discuss with FMTS to dc NS @ 30 to prevent hypernatremia   MiBarth KirksPharmD, BCPS, BCCCP Clinical Pharmacist [phone] check AMION for all MC[facility]sterumbers  12/05/2019 8:09 AM

## 2019-12-05 NOTE — Progress Notes (Signed)
Nutrition Follow-up  DOCUMENTATION CODES:   Underweight  INTERVENTION:   -TPN management per pharmacy -RD will follow for diet advancement and adjust supplement regimen as appropriate  NUTRITION DIAGNOSIS:   Increased nutrient needs related to post-op healing as evidenced by estimated needs.  Ongoing  GOAL:   Patient will meet greater than or equal to 90% of their needs  Met with TPN  MONITOR:   Diet advancement, Labs, Weight trends, Skin, I & O's  REASON FOR ASSESSMENT:   Consult New TPN/TNA  ASSESSMENT:   Eric Castillo. is a 44 y.o. male presenting with 2 to 3-day history of intermittent severe abdominal pain, constipation, vomiting.  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  11/29/19- s/pProcedure(s): EXPLORATORY LAPAROTOMY ILEOCECECTOMY  APPENDCECTOMY 4/1- PICC placed, TPN initiated  Reviewed I/O's: -245 ml x 24 hours and -359 ml since admission  UOP: 2 L x 24 hours   NGT output: 1.7 L x 24 hours  Drain output: 5 ml x 24 hours  Per general surgery notes, plan to clamp NGT today as pt had BM. Pt has stopped using PCA pump and will transition to oral pain meds if tolerating clamping trial.   Pt remains NPO, dependent on TPN. He is receiving TPN at goal rate of 92 ml/hr, which provides 2316 kcals and 128 grams protein, which meets 100% of estimated kcal and protein needs.   Labs reviewed: CBGS: 106-130 (inpatient orders for glycemic control are 0-9 units insulin aspart every 8 hours).   Diet Order:   Diet Order            Diet NPO time specified Except for: Ice Chips  Diet effective now              EDUCATION NEEDS:   Education needs have been addressed  Skin:  Skin Assessment: Skin Integrity Issues: Skin Integrity Issues:: Incisions Incisions: closed abdomen  Last BM:  12/05/19  Height:   Ht Readings from Last 1 Encounters:  11/29/19 6' (1.829 m)    Weight:   Wt Readings from Last 1  Encounters:  11/29/19 61.2 kg    Ideal Body Weight:  80.9 kg  BMI:  Body mass index is 18.3 kg/m.  Estimated Nutritional Needs:   Kcal:  1504-1364  Protein:  120-135 grams  Fluid:  > 2.2 L    Loistine Chance, RD, LDN, [place] Registered Dietitian II Certified Diabetes Care and Education Specialist Please refer to [facility] for RD and/or RD on-call/weekend/after hours pager

## 2019-12-05 NOTE — Progress Notes (Signed)
Family Medicine Teaching Service Daily Progress Note Intern Pager: [phone]  Patient name: Eric Castillo. Medical record number: [id number] Date of birth: 21-Dec-1975 Age: 44 y.o. 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  SBO / Fistula in setting of crohn's disease  New Abscesses  POD#7 Ex-Lap/ileocecectomy/appendicectomy Pain well controlled withTylenol and Robaxin. Vss, afebrile  overnight. TPN via PICC started on 4/1.  NG clamped overnight and patient reports discomfort with this and requests to have it removed as soon as possible. Abd/pelvic CT completed and showed 2 fluid collections in right hemipelvis and posterior pelvis/ventral to the rectum.  Patient to undergo IR CT guided drainage today.  Patient with increased white blood cell count increased from to 21.  -IR to drain fluid collections found in pelvis -Surgery following, appreciate recommendations -Continue TPN -IV Zosyn (03/31-) -Chewing gum PRN -monitoring intake and output -PT/OT -OOB -Monitor CBC - monitor JP drain output   FEN/GI: TPN, NPO (allowed to have ice chips)  PPx: Lovenox  Disposition: discharge home when cleared by surgery   Subjective:  Patient reports discomfort with the NG to throughout the night they prevent him from sleeping.  Patient states that he is very tired.  Reports that his pain is well controlled.  Denies any flatulence or bowel movements overnight.  Reports normal urination.  Objective: Temp:  [97.6 F (36.4 C)-98.6 F (37 C)] 97.6 F (36.4 C) (04/07 0331) Pulse Rate:   [81-109] 99 (04/07 0930) Resp:  [14-20] 17 (04/07 0930) BP: (122-141)/(79-98) 138/79 (04/07 0930) SpO2:  [98 %-100 %] 99 % (04/07 0930)  Physical Exam:  General: Alert and cooperative and appears to be tired and uncomfortable in comparison to prior exam Cardio: Normal S1 and S2, no S3 or S4. Rhythm is regular. No murmurs or rubs.   Pulm: Clear to auscultation bilaterally, no crackles, wheezing, or diminished breath sounds. Normal respiratory effort Abdomen: Did not appreciate any bowel sounds, Abdomen soft and non-tender to light palpation, abdominal wound dressing recently changed with spot of serosanguineous fluid, no signs of bleeding.  Extremities: No peripheral edema. Warm/ well perfused.   Neuro: Alert and oriented x4   Laboratory: Recent Labs  Lab 12/04/19 0410 12/05/19 0422 12/06/19 0453  WBC 17.8* 20.6* 21.2*  HGB 11.2* 11.0* 10.9*  HCT 35.2* 33.8* 34.0*  PLT 510* 594* 654*   Recent Labs  Lab 11/30/19 0533 12/01/19 0500 12/03/19 0313 12/04/19 0410 12/06/19 0453  NA 135   < > 141 145 136  K 4.3   < > 3.8 4.1 4.0  CL 98   < > 103 104 102  CO2 26   < > 26 28 23   BUN 9   < > 22* 25* 21*  CREATININE 0.84   < > 0.76 0.77 0.76  CALCIUM 9.2   < > 9.2 9.6 9.5  PROT 6.0*  --   --  6.8  --   BILITOT 0.6  --   --  0.5  --   ALKPHOS 53  --   --  124  --   ALT  13  --   --  20  --   AST 15  --   --  19  --   GLUCOSE 116*   < > 105* 109* 109*   < > = values in this interval not displayed.    Imaging/Diagnostic Tests: CT ABDOMEN PELVIS W CONTRAST  Result Date: 12/05/2019 CLINICAL DATA:  History of Crohn's disease. Status post recent ex lap with ileo seek ectomy and appendectomy for small bowel strictures and fistula. Evaluate for abscess. EXAM: CT ABDOMEN AND PELVIS WITH CONTRAST TECHNIQUE: Multidetector CT imaging of the abdomen and pelvis was performed using the standard protocol following bolus administration of intravenous contrast. CONTRAST:  112m OMNIPAQUE IOHEXOL  300 MG/ML  SOLN COMPARISON:  11/27/2019. FINDINGS: Lower chest: No acute abnormality. Hepatobiliary: No focal liver abnormality is seen. No gallstones, gallbladder wall thickening, or biliary dilatation. Pancreas: Right lobe of liver cyst measures 2.1 cm. No suspicious liver abnormality. Gallbladder negative. No biliary ductal dilatation. Spleen: Normal in size without focal abnormality. Adrenals/Urinary Tract: Adrenal glands are unremarkable. Kidneys are normal, without renal calculi, focal lesion, or hydronephrosis. Bladder is unremarkable. Stomach/Bowel: NG tube is in the body of the stomach. Mild distension of the gastric lumen. Proximal small bowel loops have a normal caliber. Increase caliber of the mid and distal small bowel loops. Small bowel loops just proximal to the anastomosis measure up to 3.4 cm, image 63/3. No dilated loops of large bowel identified. Vascular/Lymphatic: Mild aortic atherosclerosis. No aneurysm. No adenopathy. Reproductive: Prostate is unremarkable. Other: There is a surgical drain which enters from the left lower quadrant approach, dives deep into the posterior pelvis and then terminates in the right lower quadrant of the abdomen. Debris and fluid collection within the posterior pelvis just ventral to the rectum measures 6.6 x 2.9 by 2.6 cm, image 67/3 and image 55/7. Within the right hemipelvis there is a fluid collection adjacent to the enterocolonic anastomosis with measures 3.4 x 3.0 by 4.9 cm, image 39/6. No additional fluid collections identified. Ventral midline open wound is identified. Musculoskeletal: No acute or significant osseous findings. IMPRESSION: 1. There are 2 fluid collections identified within the pelvis. The largest is located within the posterior pelvis just ventral to the rectum measuring up to 6.6 cm. The second fluid collection is within the right hemipelvis adjacent to the anastomosis measuring up to 4.9 cm 2. Postoperative changes compatible with  ileocolonic anastomosis. 3. Increase caliber of the mid and distal small bowel loops compatible with postoperative ileus. Aortic Atherosclerosis (ICD10-I70.0). Electronically Signed   By: TKerby MoorsM.D.   On: 12/05/2019 14:43    SStark Klein MD 12/06/2019, 9:43 AM PGY-2, CStaleyIntern pager: [phone] text pages welcome

## 2019-12-05 NOTE — Progress Notes (Signed)
46m hydromorphone PCA wasted with NWyatt Haste RN.

## 2019-12-05 NOTE — Plan of Care (Signed)
  Problem: Activity: Goal: Risk for activity intolerance will decrease Outcome: Progressing   Problem: Coping: Goal: Level of anxiety will decrease Outcome: Progressing   Problem: Elimination: Goal: Will not experience complications related to bowel motility Outcome: Progressing   Problem: Pain Managment: Goal: General experience of comfort will improve Outcome: Progressing

## 2019-12-06 ENCOUNTER — Encounter (HOSPITAL_COMMUNITY): Payer: Self-pay | Admitting: Family Medicine

## 2019-12-06 ENCOUNTER — Other Ambulatory Visit: Payer: Self-pay

## 2019-12-06 ENCOUNTER — Inpatient Hospital Stay (HOSPITAL_COMMUNITY): Payer: Self-pay

## 2019-12-06 DIAGNOSIS — T8143XA Infection following a procedure, organ and space surgical site, initial encounter: Secondary | ICD-10-CM

## 2019-12-06 LAB — CBC
HCT: 34 % — ABNORMAL LOW (ref 39.0–52.0)
Hemoglobin: 10.9 g/dL — ABNORMAL LOW (ref 13.0–17.0)
MCH: 28.3 pg (ref 26.0–34.0)
MCHC: 32.1 g/dL (ref 30.0–36.0)
MCV: 88.3 fL (ref 80.0–100.0)
Platelets: 654 10*3/uL — ABNORMAL HIGH (ref 150–400)
RBC: 3.85 MIL/uL — ABNORMAL LOW (ref 4.22–5.81)
RDW: 18.6 % — ABNORMAL HIGH (ref 11.5–15.5)
WBC: 21.2 10*3/uL — ABNORMAL HIGH (ref 4.0–10.5)
nRBC: 0 % (ref 0.0–0.2)

## 2019-12-06 LAB — BASIC METABOLIC PANEL
Anion gap: 11 (ref 5–15)
BUN: 21 mg/dL — ABNORMAL HIGH (ref 6–20)
CO2: 23 mmol/L (ref 22–32)
Calcium: 9.5 mg/dL (ref 8.9–10.3)
Chloride: 102 mmol/L (ref 98–111)
Creatinine, Ser: 0.76 mg/dL (ref 0.61–1.24)
GFR calc Af Amer: >60 mL/min (ref >60)
GFR calc non Af Amer: >60 mL/min (ref >60)
Glucose, Bld: 109 mg/dL — ABNORMAL HIGH (ref 70–99)
Potassium: 4 mmol/L (ref 3.5–5.1)
Sodium: 136 mmol/L (ref 135–145)

## 2019-12-06 LAB — GLUCOSE, CAPILLARY
Glucose-Capillary: 95 mg/dL (ref 70–99)
Glucose-Capillary: 111 mg/dL — ABNORMAL HIGH (ref 70–99)
Glucose-Capillary: 112 mg/dL — ABNORMAL HIGH (ref 70–99)

## 2019-12-06 LAB — PROTIME-INR
INR: 1.1 (ref 0.8–1.2)
Prothrombin Time: 13.7 seconds (ref 11.4–15.2)

## 2019-12-06 MED ORDER — LIDOCAINE HCL 1 % IJ SOLN
INTRAMUSCULAR | Status: AC
  Filled 2019-12-06: qty 20

## 2019-12-06 MED ORDER — FENTANYL CITRATE (PF) 100 MCG/2ML IJ SOLN
INTRAMUSCULAR | Status: AC
  Filled 2019-12-06: qty 2

## 2019-12-06 MED ORDER — METHOCARBAMOL 500 MG PO TABS
500.0000 mg | ORAL_TABLET | Freq: Three times a day (TID) | ORAL | Status: DC
  Administered 2019-12-06 – 2019-12-11 (×15): 500 mg via ORAL
  Filled 2019-12-06 (×16): qty 1

## 2019-12-06 MED ORDER — MIDAZOLAM HCL 2 MG/2ML IJ SOLN
INTRAMUSCULAR | Status: AC
  Filled 2019-12-06: qty 2

## 2019-12-06 MED ORDER — TRAVASOL 10 % IV SOLN
INTRAVENOUS | Status: AC
  Filled 2019-12-06: qty 1280.64

## 2019-12-06 NOTE — Procedures (Signed)
Interventional Radiology Procedure Note  Procedure: Attempt at CT guided transgluteal drain for pelvic abscess.   Findings: Initial images show that the pelvic collection in the recto-vesicle space decompressed/smaller, with no target for drain.  Also the more superior central collection appears smaller, though poorly defined with absence of contrast.   Recommendations:  - continue current care - VIR available if needed in the future  Signed,  Dulcy Fanny. Earleen Newport, DO

## 2019-12-06 NOTE — Sedation Documentation (Signed)
Procedure not started, see MD note for details, report called to floor RN and pt sent back to his room

## 2019-12-06 NOTE — Progress Notes (Signed)
Physical Therapy Treatment Patient Details Name: Eric Castillo. MRN: [id number] DOB: [birth year] Today's Date: 12/06/2019    History of Present Illness Pt is 44 yo male presenting to ED with 2-3 day hx of abd/pain.  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 Pt is s/p exp lap, ileocecectomy, appendcectomy on 11/29/19 - note fascia close but skin left open with dressing.  Pt with hx of Crohn' disease.    PT Comments    Patient received in bed, very pleasant and polite and looking forward to PT today. NGT already removed and he reports/chart confirms JP might also be removed later today. Able to complete bed mobility with Mod(I), otherwise only required S with IV pole mostly for safety with lines during all other mobility during session. Tolerated progression of gait distance to approximately 1368f today with IV pole and S, intermittent standing rest breaks due to abdominal cramping. Left up in recliner with all needs met this afternoon. Progressing well with PT.     Follow Up Recommendations  No PT follow up     Equipment Recommendations  None recommended by PT    Recommendations for Other Services       Precautions / Restrictions Precautions Precautions: Fall Precaution Comments: NG tube, JP drain Restrictions Weight Bearing Restrictions: No    Mobility  Bed Mobility Overal bed mobility: Modified Independent Bed Mobility: Supine to Sit     Supine to sit: Modified independent (Device/Increase time)     General bed mobility comments: HOB elevated, use of bed rail  Transfers Overall transfer level: Needs assistance Equipment used: None Transfers: Sit to/from Stand Sit to Stand: Supervision Stand pivot transfers: Supervision       General transfer comment: for safety and line management  Ambulation/Gait Ambulation/Gait assistance: Supervision Gait Distance (Feet): 1300 Feet Assistive device: IV Pole Gait Pattern/deviations: Decreased stride  length;Trunk flexed Gait velocity: decreased   General Gait Details: S for safety and lines, encouraged rest breaks and pacing of activity due to pain/bloating   Stairs             Wheelchair Mobility    Modified Rankin (Stroke Patients Only)       Balance Overall balance assessment: No apparent balance deficits (not formally assessed) Sitting-balance support: No upper extremity supported;Feet supported Sitting balance-Leahy Scale: Good     Standing balance support: No upper extremity supported;Single extremity supported;During functional activity Standing balance-Leahy Scale: Good Standing balance comment: able to walk backwards while holding IV pole without difficulty                            Cognition Arousal/Alertness: Awake/alert Behavior During Therapy: WFL for tasks assessed/performed Overall Cognitive Status: Within Functional Limits for tasks assessed                                 General Comments: very pleasant and polite today, excited about having NG tube out and looking forward to possibly having JP removed but still frustrated about not passing gas or having BM      Exercises      General Comments        Pertinent Vitals/Pain Pain Assessment: Faces Faces Pain Scale: Hurts little more Pain Location: cramping in abd. and back Pain Descriptors / Indicators: Cramping Pain Intervention(s): Monitored during session    Home Living  Prior Function            PT Goals (current goals can now be found in the care plan section) Acute Rehab PT Goals Patient Stated Goal: less pain  PT Goal Formulation: With patient Time For Goal Achievement: 12/14/19 Potential to Achieve Goals: Good Progress towards PT goals: Progressing toward goals    Frequency    Min 3X/week      PT Plan Current plan remains appropriate    Co-evaluation              AM-PAC PT "6 Clicks" Mobility    Outcome Measure  Help needed turning from your back to your side while in a flat bed without using bedrails?: None Help needed moving from lying on your back to sitting on the side of a flat bed without using bedrails?: None Help needed moving to and from a bed to a chair (including a wheelchair)?: None Help needed standing up from a chair using your arms (e.g., wheelchair or bedside chair)?: None Help needed to walk in hospital room?: None Help needed climbing 3-5 steps with a railing? : A Little 6 Click Score: 23    End of Session   Activity Tolerance: Patient tolerated treatment well Patient left: in chair;with call bell/phone within reach Nurse Communication: Mobility status PT Visit Diagnosis: Other abnormalities of gait and mobility (R26.89)     Time: 7588-3254 PT Time Calculation (min) (ACUTE ONLY): 26 min  Charges:  $Gait Training: 8-22 mins $Therapeutic Activity: 8-22 mins                     Windell Norfolk, DPT, PN1   Supplemental Physical Therapist [place]    Pager [phone] Acute Rehab Office [phone]

## 2019-12-06 NOTE — Consult Note (Signed)
Chief Complaint: Patient was seen in consultation today for pelvic abscess drain placement Chief Complaint  Patient presents with  . Abdominal Pain   at the request of Dr Georgiann Cocker    Supervising Physician: Corrie Mckusick  Patient Status: [facility] - In-pt  History of Present Illness: Eric Trettel. is a 44 y.o. male   Post op day 6 s/p ex lap with ileocecectomy, appendectomy, DB 3/31 for crohn's disease with small bowel stricture and fistula - surgical pathc/w crohn's disease  abd pain and wbc climbing  CT yesterday: IMPRESSION: 1. There are 2 fluid collections identified within the pelvis. The largest is located within the posterior pelvis just ventral to the rectum measuring up to 6.6 cm. The second fluid collection is within the right hemipelvis adjacent to the anastomosis measuring up to 4.9 cm 2. Postoperative changes compatible with ileocolonic anastomosis. 3. Increase caliber of the mid and distal small bowel loops compatible with postoperative ileus.  Request for drain placement per [facility] reviewed and approved with Dr Kathlene Cote  Past Medical History:  Diagnosis Date  . Asthma   . Crohn's disease ([place])   . SBO (small bowel obstruction) ([facility])     Past Surgical History:  Procedure Laterality Date  . APPENDECTOMY N/A 11/29/2019   Procedure: Appendectomy;  Surgeon: Coralie Keens, MD;  Location: [place];  Service: General;  Laterality: N/A;  . BIOPSY  12/28/2018   Procedure: BIOPSY;  Surgeon: Mauri Pole, MD;  Location: [place];  Service: Endoscopy;;  . COLONOSCOPY WITH PROPOFOL N/A 12/28/2018   Procedure: COLONOSCOPY WITH PROPOFOL;  Surgeon: Mauri Pole, MD;  Location: [place] [facility];  Service: Endoscopy;  Laterality: N/A;  Pennie Rushing N/A 11/29/2019   Procedure: Ileocecetomy;  Surgeon: Coralie Keens, MD;  Location: [place];  Service: General;  Laterality: N/A;  . LAPAROTOMY N/A 11/29/2019   Procedure: EXPLORATORY LAPAROTOMY;   Surgeon: Coralie Keens, MD;  Location: [place];  Service: General;  Laterality: N/A;  . MASS EXCISION     UPPER BACK     Allergies: Patient has no known allergies.  Medications: Prior to Admission medications   Medication Sig Start Date End Date Taking? Authorizing Provider  albuterol (PROVENTIL HFA;VENTOLIN HFA) 108 (90 Base) MCG/ACT inhaler Inhale 1-2 puffs into the lungs every 6 (six) hours as needed for wheezing or shortness of breath.   Yes [provider]  gabapentin (NEURONTIN) 100 MG capsule Take 1 capsule (100 mg total) by mouth 2 (two) times daily. 11/20/19  Yes Pollina, Gwenyth Allegra, MD  potassium chloride SA (KLOR-CON) 20 MEQ tablet Take 1 tablet (20 mEq total) by mouth daily. 11/20/19  Yes Pollina, Gwenyth Allegra, MD  predniSONE (DELTASONE) 10 MG tablet Take 4 tablets (40 mg total) by mouth daily for 14 days, THEN 3 tablets (30 mg total) daily for 14 days, THEN 2 tablets (20 mg total) daily for 14 days, THEN 1 tablet (10 mg total) daily for 14 days. 11/09/19 01/04/20 Yes Esterwood, [name] S, PA-C  dicyclomine (BENTYL) 10 MG capsule Take 1 capsule (10 mg total) by mouth 4 (four) times daily -  before meals and at bedtime. Patient not taking: Reported on 11/27/2019 11/09/19   Alfredia Ferguson, PA-C     Family History  Problem Relation Age of Onset  . Crohn's disease Mother   . Heart attack Mother        In her late 20s.  This was the cause of her death.  . Colon cancer Father  In his mid-to-late 41s.  As of 2020, patient in his early 54s and cancer said to be in remission.  Marland Kitchen Heart disease Father        has pacemaker    Social History   Socioeconomic History  . Marital status: Single    Spouse name: Not on file  . Number of children: 0  . Years of education: Not on file  . Highest education level: Not on file  Occupational History    Employer: [employer]  Tobacco Use  . Smoking status: Current Every Day Smoker    Packs/day: 0.50    Years: 21.00    Pack  years: 10.50    Types: Cigarettes, Cigars  . Smokeless tobacco: Never Used  Substance and Sexual Activity  . Alcohol use: Yes  . Drug use: Yes    Types: Marijuana  . Sexual activity: Not on file  Other Topics Concern  . Not on file  Social History Narrative   Engaged. Lives with fiance and father in [place].   HS grad + 1 year college   + ETOH, marijuana and smoker   Starting new job as Therapist, music    Social Determinants of Radio broadcast assistant Strain:   . Difficulty of Paying Living Expenses:   Food Insecurity:   . Worried About Charity fundraiser in the Last Year:   . Arboriculturist in the Last Year:   Transportation Needs:   . Film/video editor (Medical):   Marland Kitchen Lack of Transportation (Non-Medical):   Physical Activity:   . Days of Exercise per Week:   . Minutes of Exercise per Session:   Stress:   . Feeling of Stress :   Social Connections:   . Frequency of Communication with Friends and Family:   . Frequency of Social Gatherings with Friends and Family:   . Attends Religious Services:   . Active Member of Clubs or Organizations:   . Attends Archivist Meetings:   Marland Kitchen Marital Status:      Review of Systems: A 12 point ROS discussed and pertinent positives are indicated in the HPI above.  All other systems are negative.  Review of Systems  Constitutional: Positive for appetite change. Negative for fever.  Respiratory: Negative for cough and shortness of breath.   Cardiovascular: Negative for chest pain.  Gastrointestinal: Positive for abdominal pain and nausea.  Psychiatric/Behavioral: Negative for behavioral problems and confusion.    Vital Signs: BP 128/89 (BP Location: Left Arm)   Pulse 95   Temp 97.6 F (36.4 C) (Oral)   Resp 16   Ht 6' (1.829 m)   Wt 134 lb 14.7 oz (61.2 kg)   SpO2 100%   BMI 18.30 kg/m   Physical Exam Vitals reviewed.  Cardiovascular:     Rate and Rhythm: Normal rate and regular rhythm.    Pulmonary:     Breath sounds: Normal breath sounds.  Abdominal:     General: There is no distension.     Tenderness: There is abdominal tenderness.  Skin:    General: Skin is warm and dry.  Neurological:     Mental Status: He is alert and oriented to person, place, and time.  Psychiatric:        Behavior: Behavior normal.     Imaging: DG Abd 1 View  Result Date: 11/29/2019 CLINICAL DATA:  Nasogastric placement. EXAM: ABDOMEN - 1 VIEW COMPARISON:  None. FINDINGS: Nasogastric tube enters the stomach with  its tip in the mid body. Other catheter overlies the pelvis. Bowel gas pattern unremarkable. IMPRESSION: Nasogastric tube tip in the body of the stomach. Electronically Signed   By: Nelson Chimes M.D.   On: 11/29/2019 12:47   CT ABDOMEN PELVIS W CONTRAST  Result Date: 12/05/2019 CLINICAL DATA:  History of Crohn's disease. Status post recent ex lap with ileo seek ectomy and appendectomy for small bowel strictures and fistula. Evaluate for abscess. EXAM: CT ABDOMEN AND PELVIS WITH CONTRAST TECHNIQUE: Multidetector CT imaging of the abdomen and pelvis was performed using the standard protocol following bolus administration of intravenous contrast. CONTRAST:  155m OMNIPAQUE IOHEXOL 300 MG/ML  SOLN COMPARISON:  11/27/2019. FINDINGS: Lower chest: No acute abnormality. Hepatobiliary: No focal liver abnormality is seen. No gallstones, gallbladder wall thickening, or biliary dilatation. Pancreas: Right lobe of liver cyst measures 2.1 cm. No suspicious liver abnormality. Gallbladder negative. No biliary ductal dilatation. Spleen: Normal in size without focal abnormality. Adrenals/Urinary Tract: Adrenal glands are unremarkable. Kidneys are normal, without renal calculi, focal lesion, or hydronephrosis. Bladder is unremarkable. Stomach/Bowel: NG tube is in the body of the stomach. Mild distension of the gastric lumen. Proximal small bowel loops have a normal caliber. Increase caliber of the mid and distal  small bowel loops. Small bowel loops just proximal to the anastomosis measure up to 3.4 cm, image 63/3. No dilated loops of large bowel identified. Vascular/Lymphatic: Mild aortic atherosclerosis. No aneurysm. No adenopathy. Reproductive: Prostate is unremarkable. Other: There is a surgical drain which enters from the left lower quadrant approach, dives deep into the posterior pelvis and then terminates in the right lower quadrant of the abdomen. Debris and fluid collection within the posterior pelvis just ventral to the rectum measures 6.6 x 2.9 by 2.6 cm, image 67/3 and image 55/7. Within the right hemipelvis there is a fluid collection adjacent to the enterocolonic anastomosis with measures 3.4 x 3.0 by 4.9 cm, image 39/6. No additional fluid collections identified. Ventral midline open wound is identified. Musculoskeletal: No acute or significant osseous findings. IMPRESSION: 1. There are 2 fluid collections identified within the pelvis. The largest is located within the posterior pelvis just ventral to the rectum measuring up to 6.6 cm. The second fluid collection is within the right hemipelvis adjacent to the anastomosis measuring up to 4.9 cm 2. Postoperative changes compatible with ileocolonic anastomosis. 3. Increase caliber of the mid and distal small bowel loops compatible with postoperative ileus. Aortic Atherosclerosis (ICD10-I70.0). Electronically Signed   By: TKerby MoorsM.D.   On: 12/05/2019 14:43   CT ABDOMEN PELVIS W CONTRAST  Result Date: 11/27/2019 CLINICAL DATA:  Severe umbilical pain.  History of Crohn's disease. EXAM: CT ABDOMEN AND PELVIS WITH CONTRAST TECHNIQUE: Multidetector CT imaging of the abdomen and pelvis was performed using the standard protocol following bolus administration of intravenous contrast. CONTRAST:  797mOMNIPAQUE IOHEXOL 300 MG/ML  SOLN COMPARISON:  10/30/2019 FINDINGS: Lower chest:  No acute finding.  Minor atelectasis. Hepatobiliary: Cystic densities in the  central and right liver.No evidence of biliary obstruction or stone. Pancreas: Unremarkable. Spleen: Unremarkable. Adrenals/Urinary Tract: Negative adrenals. No hydronephrosis or stone. Unremarkable bladder. Stomach/Bowel: Distal ileal inflammation with wall thickening and mucosal hyperenhancement. There is tethering of small bowel loops in the pelvis with a visible fistula between 2 small bowel loops on coronal reformats. There is a separate area of more proximal inflammation in the central lower abdomen with an additional sinus tract spanning 2 small bowel loops, marked on 6:66. The distal ileum  is also the level of chronic small bowel obstruction with dilated loops measuring up to 3.5 cm in diameter. No bowel necrosis noted. Vascular/Lymphatic: Mild atherosclerotic calcification. No mass or adenopathy. Reproductive:No pathologic findings. Other: No ascites or pneumoperitoneum. Musculoskeletal: No acute abnormalities.  No sacroiliitis. IMPRESSION: Active Crohn's enteritis with chronic, partial small bowel obstruction at the distal ileum. 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 Electronically Signed   By: Monte Fantasia M.D.   On: 11/27/2019 07:13   [place] EKG SITE RITE  Result Date: 11/29/2019 If Site Rite image not attached, placement could not be confirmed due to current cardiac rhythm.   Labs:  CBC: Recent Labs    12/01/19 0500 12/04/19 0410 12/05/19 0422 12/06/19 0453  WBC 10.2 17.8* 20.6* 21.2*  HGB 9.8* 11.2* 11.0* 10.9*  HCT 29.9* 35.2* 33.8* 34.0*  PLT 263 510* 594* 654*    COAGS: Recent Labs    12/06/19 0453  INR 1.1    BMP: Recent Labs    12/01/19 0500 12/03/19 0313 12/04/19 0410 12/06/19 0453  NA 135 141 145 136  K 3.2* 3.8 4.1 4.0  CL 96* 103 104 102  CO2 28 26 28 23   GLUCOSE 132* 105* 109* 109*  BUN 6 22* 25* 21*  CALCIUM 8.7* 9.2 9.6 9.5  CREATININE 0.84 0.76 0.77 0.76  GFRNONAA >60 >60 >60 >60    GFRAA >60 >60 >60 >60    LIVER FUNCTION TESTS: Recent Labs    11/27/19 0258 11/28/19 0422 11/30/19 0533 12/04/19 0410  BILITOT 0.8 0.9 0.6 0.5  AST 17 12* 15 19  ALT 22 16 13 20   ALKPHOS 60 57 53 124  PROT 6.8 6.0* 6.0* 6.8  ALBUMIN 3.4* 2.6* 2.8* 2.6*    TUMOR MARKERS: No results for input(s): AFPTM, CEA, CA199, CHROMGRNA in the last 8760 hours.  Assessment and Plan:  POD 6, s/p ex lap with ileocecectomy, appendectomy, DB 3/31 for crohn's disease with small bowel stricture and fistula New abscess noted on CT-- pelvic abscess Scheduled now for drain placement in IR Risks and benefits discussed with the patient including bleeding, infection, damage to adjacent structures, bowel perforation/fistula connection, and sepsis.  All of the patient's questions were answered, patient is agreeable to proceed. Consent signed and in chart.   Thank you for this interesting consult.  I greatly enjoyed meeting Eric Castillo. and look forward to participating in their care.  A copy of this report was sent to the requesting provider on this date.  Electronically Signed: Lavonia Drafts, PA-C 12/06/2019, 8:34 AM   I spent a total of 40 Minutes    in face to face in clinical consultation, greater than 50% of which was counseling/coordinating care for pelvic abscess drain

## 2019-12-06 NOTE — Progress Notes (Signed)
[facility] Progress Note  7 Days Post-Op  Subjective: Patient reports some abdominal pain after having to lay on his stomach. Tolerated having NGT clamped all night, denied bloating with sips of water. Denies nausea. No flatus or further BM. No distention.   Objective: Vital signs in last 24 hours: Temp:  [97.6 F (36.4 C)-98.6 F (37 C)] 98.1 F (36.7 C) (04/07 1011) Pulse Rate:  [81-101] 91 (04/07 1011) Resp:  [14-20] 18 (04/07 1011) BP: (122-141)/(79-90) 129/88 (04/07 1011) SpO2:  [98 %-100 %] 100 % (04/07 1011) Last BM Date: 12/05/19  Intake/Output from previous day: 04/06 0701 - 04/07 0700 In: 1645 [I.V.:1183.5; IV Piggyback:461.6] Out: 1360 [Urine:1200; Emesis/NG output:150; Drains:10] Intake/Output this shift: No intake/output data recorded.  PE: General: pleasant, WD, thin male who is laying in bed in NAD Heart: regular, rate, and rhythm.  Normal s1,s2. No obvious murmurs, gallops, or rubs noted.  Palpable radial and pedal pulses bilaterally Lungs: CTAB, no wheezes, rhonchi, or rales noted.  Respiratory effort nonlabored Abd: soft, appropriately ttp, ND, +BS, midline wound clean, drain with small amount SS fluid MS: all 4 extremities are symmetrical with no cyanosis, clubbing, or edema. Psych: A&Ox3 with an appropriate affect.   Lab Results:  Recent Labs    12/05/19 0422 12/06/19 0453  WBC 20.6* 21.2*  HGB 11.0* 10.9*  HCT 33.8* 34.0*  PLT 594* 654*   BMET Recent Labs    12/04/19 0410 12/06/19 0453  NA 145 136  K 4.1 4.0  CL 104 102  CO2 28 23  GLUCOSE 109* 109*  BUN 25* 21*  CREATININE 0.77 0.76  CALCIUM 9.6 9.5   PT/INR Recent Labs    12/06/19 0453  LABPROT 13.7  INR 1.1   CMP     Component Value Date/Time   NA 136 12/06/2019 0453   K 4.0 12/06/2019 0453   CL 102 12/06/2019 0453   CO2 23 12/06/2019 0453   GLUCOSE 109 (H) 12/06/2019 0453   BUN 21 (H) 12/06/2019 0453   CREATININE 0.76 12/06/2019 0453   CALCIUM 9.5  12/06/2019 0453   PROT 6.8 12/04/2019 0410   ALBUMIN 2.6 (L) 12/04/2019 0410   AST 19 12/04/2019 0410   ALT 20 12/04/2019 0410   ALKPHOS 124 12/04/2019 0410   BILITOT 0.5 12/04/2019 0410   GFRNONAA >60 12/06/2019 0453   GFRAA >60 12/06/2019 0453   Lipase     Component Value Date/Time   LIPASE 25 11/27/2019 0258       Studies/Results: CT ABDOMEN PELVIS W CONTRAST  Result Date: 12/05/2019 CLINICAL DATA:  History of Crohn's disease. Status post recent ex lap with ileo seek ectomy and appendectomy for small bowel strictures and fistula. Evaluate for abscess. EXAM: CT ABDOMEN AND PELVIS WITH CONTRAST TECHNIQUE: Multidetector CT imaging of the abdomen and pelvis was performed using the standard protocol following bolus administration of intravenous contrast. CONTRAST:  133m OMNIPAQUE IOHEXOL 300 MG/ML  SOLN COMPARISON:  11/27/2019. FINDINGS: Lower chest: No acute abnormality. Hepatobiliary: No focal liver abnormality is seen. No gallstones, gallbladder wall thickening, or biliary dilatation. Pancreas: Right lobe of liver cyst measures 2.1 cm. No suspicious liver abnormality. Gallbladder negative. No biliary ductal dilatation. Spleen: Normal in size without focal abnormality. Adrenals/Urinary Tract: Adrenal glands are unremarkable. Kidneys are normal, without renal calculi, focal lesion, or hydronephrosis. Bladder is unremarkable. Stomach/Bowel: NG tube is in the body of the stomach. Mild distension of the gastric lumen. Proximal small bowel loops have a normal caliber. Increase caliber of  the mid and distal small bowel loops. Small bowel loops just proximal to the anastomosis measure up to 3.4 cm, image 63/3. No dilated loops of large bowel identified. Vascular/Lymphatic: Mild aortic atherosclerosis. No aneurysm. No adenopathy. Reproductive: Prostate is unremarkable. Other: There is a surgical drain which enters from the left lower quadrant approach, dives deep into the posterior pelvis and then  terminates in the right lower quadrant of the abdomen. Debris and fluid collection within the posterior pelvis just ventral to the rectum measures 6.6 x 2.9 by 2.6 cm, image 67/3 and image 55/7. Within the right hemipelvis there is a fluid collection adjacent to the enterocolonic anastomosis with measures 3.4 x 3.0 by 4.9 cm, image 39/6. No additional fluid collections identified. Ventral midline open wound is identified. Musculoskeletal: No acute or significant osseous findings. IMPRESSION: 1. There are 2 fluid collections identified within the pelvis. The largest is located within the posterior pelvis just ventral to the rectum measuring up to 6.6 cm. The second fluid collection is within the right hemipelvis adjacent to the anastomosis measuring up to 4.9 cm 2. Postoperative changes compatible with ileocolonic anastomosis. 3. Increase caliber of the mid and distal small bowel loops compatible with postoperative ileus. Aortic Atherosclerosis (ICD10-I70.0). Electronically Signed   By: Kerby Moors M.D.   On: 12/05/2019 14:43    Anti-infectives: Anti-infectives (From admission, onward)   Start     Dose/Rate Route Frequency Ordered Stop   11/29/19 1500  piperacillin-tazobactam (ZOSYN) IVPB 3.375 g     3.375 g 12.5 mL/hr over 240 Minutes Intravenous Every 8 hours 11/29/19 1345     11/29/19 0600  ceFAZolin (ANCEF) IVPB 2g/100 mL premix     2 g 200 mL/hr over 30 Minutes Intravenous On call to O.R. 11/28/19 1334 11/29/19 0825       Assessment/Plan Tobacco abuse - 1 black and mild daily Asthma - albuterol inhaler PRN ABL anemia -stable Malnutrition - prealbumin 17/TNA  POD 7, s/p ex lap with ileocecectomy, appendectomy, DB 3/31 for crohn's disease with small bowel stricture and fistula - surgical pathc/w crohn's disease - patient tolerating sips with NGT clamped, d/c NGT today  - CT yesterday showed 2 collections - IR consulted but were unable to place drains today - mobilize,IS -  discussing changing abx with pharmacy, repeat CBC in AM  ID -zosyn 3/31>> FEN -IVF, sips/chips, TPN VTE -SCDs,lovenox Foley -d/c 4/1 Follow up -Dr. Ninfa Linden, GI  LOS: 9 days    Brigid Re , [facility] 12/06/2019, 10:35 AM Please see Amion for pager number during day hours 7:00am-4:30pm

## 2019-12-06 NOTE — Progress Notes (Signed)
PHARMACY - TOTAL PARENTERAL NUTRITION CONSULT NOTE   Indication: Prolonged ileus  Patient Measurements: Height: 6' (182.9 cm) Weight: 61.2 kg (134 lb 14.7 oz) IBW/kg (Calculated) : 77.6 TPN AdjBW (KG): 61.2 Body mass index is 18.3 kg/m.  Assessment: 44 year old gentleman with history of Crohn's disease, cannabis use and asthma presenting with several days of abdominal pain. Last bowel movement was 2 days PTA. 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 S/p Ex-Lap/ileocecectomy/appendicectomy 3/31. Anticipate a prolonged post-op ileus.  4/5 note - TPN order inadvertently entered incorrectly 4/4 (timing of hang vs dc), but TPN is currently hanging correctly (and has been all night)  Glucose / Insulin: CBGs all <140 , 0 units SSI since last bag hung Electrolytes: WNL ( K goal>/= 4; Mg >/= 2 with ileus) Na still WNL but rising Renal: Scr 0.77 (UOP 0.4) LFTs / TGs: LFTs + Tbili WNL, TG 88 Prealbumin / albumin: Prealbumin 15>17.3, albumin 2.6 Intake / Output; MIVF: UOP 0.8 mL/kg/hr, NG O/P 150 mL, 10 mL drain output - LBM 4/6 am GI Imaging: 3/29 CT abd - Active Crohn's enteritis with chronic, partial small bowel obstruction at the distal ileum. 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 marked on coronal reformats 4/6 CT Abd - 2 fluid collections within pelvis  Surgeries / Procedures:  3/31 Exlap, ileocecectomy, appendectomy 4/7 - planning IR placement of drain  Central access: PICC placed 4/1 TPN start date: 4/1  Nutritional Goals (per RD recommendation on 4/1): KCal: 2250-2450, Protein: 120-135g, Fluid >2.2L Goal TPN rate is 92 mL/hr (provides 120 g of protein and 2285 kcals per day)  Current Nutrition:  TPN  Chewing gum ordered  Plan:  Continue TPN at goal rate of 62m/hr at 1800     This TPN provides 128g protein, 322g dextrose, 71g lipid and 2316kcal           meeting 100% of patient needs Electrolytes  in TPN: slightly increase Na to prevent drop below normal Add standard MVI MWF due to national shortage and trace elements to TPN Continue reduced SSI and CBGs to q8h - consider dc if continues to not require SSI Monitor TPN labs on Mon/Thurs and prn  MBarth Kirks PharmD, BCPS, BCCCP Clinical Pharmacist [phone] Please check AMION for all [place]  12/06/2019 8:11 AM

## 2019-12-06 NOTE — Progress Notes (Signed)
Family Medicine Teaching Service Daily Progress Note Intern Pager: [phone]  Patient name: Eric Castillo. Medical record number: [id number] Date of birth: [birth year] Age: 44 y.o. 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  SBO / Fistula in setting of crohn's disease  New Abscesses  Patient reports much better after having NG tube removed. NG tube removed overnight. Patient was continued on Zocysn. Surgery plans to re-image abdomen next week.  Noted to be tolerating clear liquid diet.  Afebrile overnight other vitals within normal limits.  To need to be on TPN -Surgery following, appreciate recommendations -Continue TPN -IV Zosyn (03/31-) -Chewing gum PRN -monitoring intake and output -PT/OT -OOB -Monitor CBC  FEN/GI: Clear liquid diet PPx: Lovenox  Disposition: discharge home when cleared by surgery   Subjective:  Patient reports feeling well.  Reports having pain trouble pebble-like bowel movement yesterday.  Reports feeling much better since having NG tube removed.  Denies fevers, chills, chest pain, shortness of breath.  Patient was sitting up on side of the bed talking with a friend and very pleasant this morning.  Objective: Temp:  [97.9 F (36.6 C)-99.1 F (37.3 C)] 97.9 F (36.6 C) (04/08 1002) Pulse Rate:  [92-102] 92 (04/08 1002) Resp:  [18-20] 18 (04/08 1002) BP: (120-136)/(83-97) 130/83 (04/08 1002) SpO2:  [98 %-100 %] 98 % (04/08 1002)  Physical Exam General: Male appearing stated age sitting up on side of bed having breakfast in no acute distress  pleasant and conversational HEENT: MMM, no scleral icterus or conjunctival injection Cardiac:RRR without murmurs,gallops or friction rubs, normal S1 and S2, pulses palpable throughout  Pulmonary: CTAB without wheezing, crackles, stable on RA, normal WOB Abdomen: soft, NT, +bowel sounds, no rebound, no guarding, wound dressing dry and intact, JP drain removed Extremities: no LE edema, moves bilateral upper and lower extremities with normal ROM  Neuro: alert and oriented x4  Laboratory: Recent Labs  Lab 12/05/19 0422 12/06/19 0453 12/07/19 0428  WBC 20.6* 21.2* 19.4*  HGB 11.0* 10.9* 11.0*  HCT 33.8* 34.0* 34.5*  PLT 594* 654* 681*   Recent Labs  Lab 12/04/19 0410 12/06/19 0453 12/07/19 0428  NA 145 136 134*  K 4.1 4.0 4.3  CL 104 102 100  CO2 28 23 23   BUN 25* 21* 20  CREATININE 0.77 0.76 0.77  CALCIUM 9.6 9.5 9.5  PROT 6.8  --  7.4  BILITOT 0.5  --  0.7  ALKPHOS 124  --  163*  ALT 20  --  58*  AST 19  --  34  GLUCOSE 109* 109* 109*    Imaging/Diagnostic Tests: No results found.  Stark Klein, MD 12/07/2019, 12:52 PM PGY-2, [place] Intern pager: [phone], text pages welcome

## 2019-12-06 NOTE — Plan of Care (Signed)
  Problem: Education: Goal: Knowledge of General Education information will improve Description Including pain rating scale, medication(s)/side effects and non-pharmacologic comfort measures Outcome: Progressing   

## 2019-12-07 DIAGNOSIS — Z789 Other specified health status: Secondary | ICD-10-CM

## 2019-12-07 LAB — COMPREHENSIVE METABOLIC PANEL WITH GFR
ALT: 58 U/L — ABNORMAL HIGH (ref 0–44)
AST: 34 U/L (ref 15–41)
Albumin: 2.8 g/dL — ABNORMAL LOW (ref 3.5–5.0)
Alkaline Phosphatase: 163 U/L — ABNORMAL HIGH (ref 38–126)
Anion gap: 11 (ref 5–15)
BUN: 20 mg/dL (ref 6–20)
CO2: 23 mmol/L (ref 22–32)
Calcium: 9.5 mg/dL (ref 8.9–10.3)
Chloride: 100 mmol/L (ref 98–111)
Creatinine, Ser: 0.77 mg/dL (ref 0.61–1.24)
GFR calc Af Amer: >60 mL/min (ref >60)
GFR calc non Af Amer: >60 mL/min (ref >60)
Glucose, Bld: 109 mg/dL — ABNORMAL HIGH (ref 70–99)
Potassium: 4.3 mmol/L (ref 3.5–5.1)
Sodium: 134 mmol/L — ABNORMAL LOW (ref 135–145)
Total Bilirubin: 0.7 mg/dL (ref 0.3–1.2)
Total Protein: 7.4 g/dL (ref 6.5–8.1)

## 2019-12-07 LAB — CBC
HCT: 34.5 % — ABNORMAL LOW (ref 39.0–52.0)
Hemoglobin: 11 g/dL — ABNORMAL LOW (ref 13.0–17.0)
MCH: 28.1 pg (ref 26.0–34.0)
MCHC: 31.9 g/dL (ref 30.0–36.0)
MCV: 88 fL (ref 80.0–100.0)
Platelets: 681 10*3/uL — ABNORMAL HIGH (ref 150–400)
RBC: 3.92 MIL/uL — ABNORMAL LOW (ref 4.22–5.81)
RDW: 18.4 % — ABNORMAL HIGH (ref 11.5–15.5)
WBC: 19.4 10*3/uL — ABNORMAL HIGH (ref 4.0–10.5)
nRBC: 0 % (ref 0.0–0.2)

## 2019-12-07 LAB — GLUCOSE, CAPILLARY
Glucose-Capillary: 105 mg/dL — ABNORMAL HIGH (ref 70–99)
Glucose-Capillary: 99 mg/dL (ref 70–99)
Glucose-Capillary: 107 mg/dL — ABNORMAL HIGH (ref 70–99)

## 2019-12-07 LAB — PHOSPHORUS: Phosphorus: 3.8 mg/dL (ref 2.5–4.6)

## 2019-12-07 LAB — MAGNESIUM: Magnesium: 2 mg/dL (ref 1.7–2.4)

## 2019-12-07 MED ORDER — PANTOPRAZOLE SODIUM 40 MG PO TBEC
40.0000 mg | DELAYED_RELEASE_TABLET | Freq: Every day | ORAL | Status: DC
  Administered 2019-12-08 – 2019-12-11 (×4): 40 mg via ORAL
  Filled 2019-12-07 (×4): qty 1

## 2019-12-07 MED ORDER — TRAVASOL 10 % IV SOLN
INTRAVENOUS | Status: AC
  Filled 2019-12-07: qty 1280.64

## 2019-12-07 MED ORDER — BOOST / RESOURCE BREEZE PO LIQD CUSTOM
1.0000 | Freq: Three times a day (TID) | ORAL | Status: DC
  Administered 2019-12-07: 20:00:00 1 via ORAL

## 2019-12-07 NOTE — Progress Notes (Signed)
Family Medicine Teaching Service Daily Progress Note Intern Pager: [phone]  Patient name: Eric Castillo. Medical record number: [id number] Date of birth: Dec 13, 1975 Age: 44 y.o. 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  SBO / Fistula in setting of crohn's disease  New Abscesses  Patient reports some pain but that it is manageable. Reports has been able to tolerate liquid diet and is planning to do slow oral intake today on full liquid diet. Reports passing flatus and having a bowel movement. WBC downtrending to 15. Patient remained afebrile overnight.  -Surgery following, appreciate recommendations -Continue TPN -IV Zosyn (03/31-) -Chewing gum PRN -monitoring intake and output -PT/OT -OOB -Monitor CBC  FEN/GI: Clear liquid diet PPx: Lovenox  Disposition: discharge home when cleared by surgery   Subjective:  Patient reports feeling well and excited that he is able to tolerate PO liquids. Reports a bowel movement and passing flatus. Had some painful episodes overnight that were well controlled with pain medication.   Objective: Temp:  [97.7 F (36.5 C)-98.5 F (36.9 C)] 98.5 F (36.9 C) (04/09 1516) Pulse Rate:  [88-103] 103 (04/09 1516) Resp:  [16-18] 17 (04/09 1516) BP: (110-142)/(81-89) 110/81 (04/09 1516) SpO2:  [100 %] 100 % (04/09 1516)  Physical Exam General: Male appearing stated age sitting up in bed in NAD, smiling and conversational  HEENT: MMM, no scleral icterus or conjunctival injection Cardiac:RRR without murmurs,gallops or friction rubs,  normal S1 and S2, pulses palpable bilaterally  Pulmonary: CTAB without wheezing, crackles, stable on RA, normal WOB Abdomen: soft, NT, +bowel sounds, wound dressing dry and intact/changed this AM Extremities: no LE edema, moves bilateral upper and lower extremities with normal ROM  Neuro: alert and oriented x4  Laboratory: Recent Labs  Lab 12/06/19 0453 12/07/19 0428 12/08/19 0944  WBC 21.2* 19.4* 15.7*  HGB 10.9* 11.0* 11.1*  HCT 34.0* 34.5* 34.7*  PLT 654* 681* 694*   Recent Labs  Lab 12/04/19 0410 12/06/19 0453 12/07/19 0428  NA 145 136 134*  K 4.1 4.0 4.3  CL 104 102 100  CO2 28 23 23   BUN 25* 21* 20  CREATININE 0.77 0.76 0.77  CALCIUM 9.6 9.5 9.5  PROT 6.8  --  7.4  BILITOT 0.5  --  0.7  ALKPHOS 124  --  163*  ALT 20  --  58*  AST 19  --  34  GLUCOSE 109* 109* 109*    Imaging/Diagnostic Tests: No results found.  Stark Klein, MD 12/08/2019, 3:40 PM PGY-2, [place] Intern pager: [phone], text pages welcome

## 2019-12-07 NOTE — Progress Notes (Signed)
Nutrition Follow-up  DOCUMENTATION CODES:   Underweight  INTERVENTION:   -TPN management per pharmacy -Boost Breeze po TID, each supplement provides 250 kcal and 9 grams of protein -RD will follow for diet advancement and adjust supplement regimen as appropriate  NUTRITION DIAGNOSIS:   Increased nutrient needs related to post-op healing as evidenced by estimated needs.  Ongoing  GOAL:   Patient will meet greater than or equal to 90% of their needs  Met with TPN  MONITOR:   Diet advancement, Labs, Weight trends, Skin, I & O's  REASON FOR ASSESSMENT:   Consult New TPN/TNA  ASSESSMENT:   Eric Castillo. is a 44 y.o. male presenting with 2 to 3-day history of intermittent severe abdominal pain, constipation, vomiting.  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  11/29/19- s/pProcedure(s): EXPLORATORY LAPAROTOMY ILEOCECECTOMY  APPENDCECTOMY 4/1- PICC placed, TPN initiated 4/7- CT revealed pelvic abscess- images revealed pelvic collection had no target for drain; NGT removed 4/8- advanced to clear liquid diet  Reviewed I/O's: -1.5 L x 24 hours and -1.5 L since admission  UOP: 1.5 L x 24 hours  Per general surgery notes, plan possible repeat CT scan this week. TPN may be d/c tomorrow if pt tolerates diet advancement.  Pt now on a clear liquid diet, however, no intake data available to assess. He is receiving TPN at goal rate of 92 ml/hr, which provides 2316 kcals and 128 grams protein, which meets 100% of estimated kcal and protein needs.   Labs reviewed: Na: 134, CBGS: 105-112 (inpatient orders for glycemic control are 0-9 units insulin aspart every 8 hours).   Diet Order:   Diet Order            Diet clear liquid Room service appropriate? Yes; Fluid consistency: Thin  Diet effective now              EDUCATION NEEDS:   Education needs have been addressed  Skin:  Skin Assessment: Skin Integrity Issues: Skin  Integrity Issues:: Incisions Incisions: closed abdomen  Last BM:  12/05/19  Height:   Ht Readings from Last 1 Encounters:  11/29/19 6' (1.829 m)    Weight:   Wt Readings from Last 1 Encounters:  11/29/19 61.2 kg    Ideal Body Weight:  80.9 kg  BMI:  Body mass index is 18.3 kg/m.  Estimated Nutritional Needs:   Kcal:  3533-1740  Protein:  120-135 grams  Fluid:  > 2.2 L    Loistine Chance, RD, LDN, [place] Registered Dietitian II Certified Diabetes Care and Education Specialist Please refer to [facility] for RD and/or RD on-call/weekend/after hours pager

## 2019-12-07 NOTE — Progress Notes (Signed)
Patient ID: Eric Castillo., male   DOB: Jun 10, 1976, 44 y.o.   MRN: [id number]    8 Days Post-Op  Subjective: Patient doing very well.  No nausea with NGT out and sips of liquids.  Had a very small BM yesterday.  Pain seems well controlled.  ROS: See above, otherwise other systems negative  Objective: Vital signs in last 24 hours: Temp:  [98.1 F (36.7 C)-99.1 F (37.3 C)] 98.5 F (36.9 C) (04/08 0421) Pulse Rate:  [91-102] 93 (04/08 0421) Resp:  [17-20] 18 (04/08 0421) BP: (120-138)/(79-97) 120/91 (04/08 0421) SpO2:  [98 %-100 %] 100 % (04/08 0421) Last BM Date: 12/05/19  Intake/Output from previous day: 04/07 0701 - 04/08 0700 In: -  Out: 1450 [Urine:1450] Intake/Output this shift: No intake/output data recorded.  PE: Abd: soft, appropriately tender, +BS, ND, incision is clean and packed, JP site healing well.  Lab Results:  Recent Labs    12/06/19 0453 12/07/19 0428  WBC 21.2* 19.4*  HGB 10.9* 11.0*  HCT 34.0* 34.5*  PLT 654* 681*   BMET Recent Labs    12/06/19 0453 12/07/19 0428  NA 136 134*  K 4.0 4.3  CL 102 100  CO2 23 23  GLUCOSE 109* 109*  BUN 21* 20  CREATININE 0.76 0.77  CALCIUM 9.5 9.5   PT/INR Recent Labs    12/06/19 0453  LABPROT 13.7  INR 1.1   CMP     Component Value Date/Time   NA 134 (L) 12/07/2019 0428   K 4.3 12/07/2019 0428   CL 100 12/07/2019 0428   CO2 23 12/07/2019 0428   GLUCOSE 109 (H) 12/07/2019 0428   BUN 20 12/07/2019 0428   CREATININE 0.77 12/07/2019 0428   CALCIUM 9.5 12/07/2019 0428   PROT 7.4 12/07/2019 0428   ALBUMIN 2.8 (L) 12/07/2019 0428   AST 34 12/07/2019 0428   ALT 58 (H) 12/07/2019 0428   ALKPHOS 163 (H) 12/07/2019 0428   BILITOT 0.7 12/07/2019 0428   GFRNONAA >60 12/07/2019 0428   GFRAA >60 12/07/2019 0428   Lipase     Component Value Date/Time   LIPASE 25 11/27/2019 0258       Studies/Results: CT ABDOMEN WO CONTRAST  Result Date: 12/06/2019 CLINICAL DATA:  44 year old male with a  history of Crohn's and pelvic abscess on recent CT 12/05/2019. He presents today for attempt at possible drainage rectovesical fluid collection. EXAM: CT ABDOMEN WITHOUT CONTRAST TECHNIQUE: Multidetector CT imaging of the abdomen was performed following the standard protocol without IV contrast. COMPARISON:  12/05/2019 FINDINGS: Stomach/Bowel: Limited visualization of small bowel and colon, with enteric contrast present. No significant distention. Surgical changes again evident in the right lower quadrant. Vascular/Lymphatic: Unremarkable visualized vasculature, with minimal atherosclerosis. Surgical drain from anterior approach in place with evidence of prior midline laparotomy/surgery. Other: The initial targeted fluid collection in the rectovesical space is significantly decreased in size, with the greatest diameter now only 16 mm as to a prior measurement of 30 mm. Transverse dimension 5.4 cm versus previous of 6.6 cm. In addition, the more superior and right fluid collection appears read distributed/decreasing, though is poorly defined with the absence of IV contrast. Musculoskeletal: Unremarkable IMPRESSION: Fluid collection in the rectovesical space is decreased in size with no safe target for attempted drainage on the current CT. We did not attempt a transgluteal drain at this time. Signed, Dulcy Fanny. Dellia Nims, [place] [facility] Electronically Signed   By: Corrie Mckusick D.O.  On: 12/06/2019 11:29   CT ABDOMEN PELVIS W CONTRAST  Result Date: 12/05/2019 CLINICAL DATA:  History of Crohn's disease. Status post recent ex lap with ileo seek ectomy and appendectomy for small bowel strictures and fistula. Evaluate for abscess. EXAM: CT ABDOMEN AND PELVIS WITH CONTRAST TECHNIQUE: Multidetector CT imaging of the abdomen and pelvis was performed using the standard protocol following bolus administration of intravenous contrast. CONTRAST:  170m OMNIPAQUE  IOHEXOL 300 MG/ML  SOLN COMPARISON:  11/27/2019. FINDINGS: Lower chest: No acute abnormality. Hepatobiliary: No focal liver abnormality is seen. No gallstones, gallbladder wall thickening, or biliary dilatation. Pancreas: Right lobe of liver cyst measures 2.1 cm. No suspicious liver abnormality. Gallbladder negative. No biliary ductal dilatation. Spleen: Normal in size without focal abnormality. Adrenals/Urinary Tract: Adrenal glands are unremarkable. Kidneys are normal, without renal calculi, focal lesion, or hydronephrosis. Bladder is unremarkable. Stomach/Bowel: NG tube is in the body of the stomach. Mild distension of the gastric lumen. Proximal small bowel loops have a normal caliber. Increase caliber of the mid and distal small bowel loops. Small bowel loops just proximal to the anastomosis measure up to 3.4 cm, image 63/3. No dilated loops of large bowel identified. Vascular/Lymphatic: Mild aortic atherosclerosis. No aneurysm. No adenopathy. Reproductive: Prostate is unremarkable. Other: There is a surgical drain which enters from the left lower quadrant approach, dives deep into the posterior pelvis and then terminates in the right lower quadrant of the abdomen. Debris and fluid collection within the posterior pelvis just ventral to the rectum measures 6.6 x 2.9 by 2.6 cm, image 67/3 and image 55/7. Within the right hemipelvis there is a fluid collection adjacent to the enterocolonic anastomosis with measures 3.4 x 3.0 by 4.9 cm, image 39/6. No additional fluid collections identified. Ventral midline open wound is identified. Musculoskeletal: No acute or significant osseous findings. IMPRESSION: 1. There are 2 fluid collections identified within the pelvis. The largest is located within the posterior pelvis just ventral to the rectum measuring up to 6.6 cm. The second fluid collection is within the right hemipelvis adjacent to the anastomosis measuring up to 4.9 cm 2. Postoperative changes compatible with  ileocolonic anastomosis. 3. Increase caliber of the mid and distal small bowel loops compatible with postoperative ileus. Aortic Atherosclerosis (ICD10-I70.0). Electronically Signed   By: TKerby MoorsM.D.   On: 12/05/2019 14:43    Anti-infectives: Anti-infectives (From admission, onward)   Start     Dose/Rate Route Frequency Ordered Stop   11/29/19 1500  piperacillin-tazobactam (ZOSYN) IVPB 3.375 g     3.375 g 12.5 mL/hr over 240 Minutes Intravenous Every 8 hours 11/29/19 1345     11/29/19 0600  ceFAZolin (ANCEF) IVPB 2g/100 mL premix     2 g 200 mL/hr over 30 Minutes Intravenous On call to O.R. 11/28/19 1334 11/29/19 0825       Assessment/Plan Tobacco abuse - 1 black and mild daily Asthma - albuterol inhaler PRN ABL anemia -stable Malnutrition - prealbumin 17/TNA  POD8, s/p ex lap with ileocecectomy, appendectomy, DB 3/31 for crohn's disease with small bowel stricture and fistula - surgical pathc/w crohn's disease - CT yesterday showed 2 collections - IR consulted but were unable to place drains today - mobilize,IS -adv to CLD  -WBC downtrending, cont abx -cont TNA, but will plan to DC in the next day or so as he tolerates diet advancement   ID -zosyn 3/31>> FEN -IVF,CLD, TPN VTE -SCDs,lovenox Foley -d/c 4/1 Follow up -Dr. BNinfa [facility]   LOS: 10 days  Henreitta Cea , [facility] 12/07/2019, 7:57 AM Please see Amion for pager number during day hours 7:00am-4:30pm or 7:00am -11:30am on weekends

## 2019-12-07 NOTE — Progress Notes (Signed)
PHARMACY - TOTAL PARENTERAL NUTRITION CONSULT NOTE   Indication: Prolonged ileus  Patient Measurements: Height: 6' (182.9 cm) Weight: 61.2 kg (134 lb 14.7 oz) IBW/kg (Calculated) : 77.6 TPN AdjBW (KG): 61.2 Body mass index is 18.3 kg/m.  Assessment: 44 year old gentleman with history of Crohn's disease, cannabis use and asthma presenting with several days of abdominal pain. Last bowel movement was 2 days PTA. 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 S/p Ex-Lap/ileocecectomy/appendicectomy 3/31. Anticipate a prolonged post-op ileus.  4/5 note - TPN order inadvertently entered incorrectly 4/4 (timing of hang vs dc), but TPN is currently hanging correctly (and has been all night)  Glucose / Insulin: CBGs all <140 , 0 units SSI since last bag hung Electrolytes: WNL ( K goal>/= 4; Mg >/= 2 with ileus) Na still WNL but rising Renal: Scr 0.77 (UOP 0.4) LFTs / TGs: LFTs + Tbili WNL, TG 88 Prealbumin / albumin: Prealbumin 15>17.3, albumin 2.6>2.8 Intake / Output; MIVF: UOP1  mL/kg/hr - LBM 4/6 am GI Imaging: 3/29 CT abd - Active Crohn's enteritis with chronic, partial small bowel obstruction at the distal ileum. 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 marked on coronal reformats 4/6 CT Abd - 2 fluid collections within pelvis  Surgeries / Procedures:  3/31 Exlap, ileocecectomy, appendectomy 4/7 - planning IR placement of drain  Central access: PICC placed 4/1 TPN start date: 4/1  Nutritional Goals (per RD recommendation on 4/1): KCal: 2250-2450, Protein: 120-135g, Fluid >2.2L Goal TPN rate is 92 mL/hr (provides 120 g of protein and 2285 kcals per day)  Current Nutrition:  TPN  Chewing gum ordered  Plan:  Continue TPN at goal rate of 15m/hr at 1800     This TPN provides 128g protein, 322g dextrose, 71g lipid and 2316kcal           meeting 100% of patient needs Electrolytes in TPN: continue to increase  Na Add standard MVI MWF due to national shortage and trace elements to TPN Continue reduced SSI and CBGs to q8h - consider dc if continues to not require SSI Monitor TPN labs on Mon/Thurs and prn  F/U stop TPN fri based on discharge plans  MBarth Kirks PharmD, BCPS, BCCCP Clinical Pharmacist [phone] Please check AMION for all [place]  12/07/2019 9:08 AM

## 2019-12-07 NOTE — Progress Notes (Signed)
Patient states that he has urge to make a bowel movement but only expels gas and he states painful when attempts is made. Instructed not to strain. Will continue to monitor. Arthor Captain LPN

## 2019-12-08 LAB — CBC
HCT: 34.7 % — ABNORMAL LOW (ref 39.0–52.0)
Hemoglobin: 11.1 g/dL — ABNORMAL LOW (ref 13.0–17.0)
MCH: 28 pg (ref 26.0–34.0)
MCHC: 32 g/dL (ref 30.0–36.0)
MCV: 87.4 fL (ref 80.0–100.0)
Platelets: 694 10*3/uL — ABNORMAL HIGH (ref 150–400)
RBC: 3.97 MIL/uL — ABNORMAL LOW (ref 4.22–5.81)
RDW: 18.4 % — ABNORMAL HIGH (ref 11.5–15.5)
WBC: 15.7 10*3/uL — ABNORMAL HIGH (ref 4.0–10.5)
nRBC: 0 % (ref 0.0–0.2)

## 2019-12-08 LAB — GLUCOSE, CAPILLARY
Glucose-Capillary: 111 mg/dL — ABNORMAL HIGH (ref 70–99)
Glucose-Capillary: 98 mg/dL (ref 70–99)
Glucose-Capillary: 106 mg/dL — ABNORMAL HIGH (ref 70–99)

## 2019-12-08 MED ORDER — ENSURE ENLIVE PO LIQD
237.0000 mL | Freq: Three times a day (TID) | ORAL | Status: DC
  Administered 2019-12-08 – 2019-12-11 (×10): 237 mL via ORAL

## 2019-12-08 MED ORDER — ADULT MULTIVITAMIN W/MINERALS CH
1.0000 | ORAL_TABLET | Freq: Every day | ORAL | Status: DC
  Administered 2019-12-09 – 2019-12-11 (×3): 1 via ORAL
  Filled 2019-12-08 (×3): qty 1

## 2019-12-08 NOTE — Progress Notes (Signed)
Family Medicine Teaching Service Daily Progress Note Intern Pager: [phone]  Patient name: Eric Castillo. Medical record number: [id number] Date of birth: 28-Jan-1976 Age: 44 y.o. 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  Pelvic Abscess s/p Small Bowel Resection s/p Ex Lap/Ileocecectomy/Appendicectomy   POD#9  Improving. Continues to be afebrile. Downward trent of WBC 15.7-->19.4--21.2.  Lab holiday today. Pain well controlled with Dilaudid and Oxycodone. LBM 04/06. TPN discontinued 04/09.  Tolerating Full Fluid diet.  Bowel Movement x 2 yesterday.Benign exam.  Abdominal dressing dry and intact.   -Consider d/c Dilaudid -Continue Oxycodone 5-10 mg q4h prn -Continue IV Zosyn(03/31-) -Lab holiday today -Repeat CBC, CMP, Mag, Phos labs tomorrow -Monitor fever curve -Monitor po intake -Monitor for bowel movements -Incentive Spirometer q2h while awake -PT/OT following -OOB daily  FEN/GI:  -Full Fluids  PPx: -Lovenox  Disposition: Home pending surgery recommendations  Subjective:  No acute events overnight.  Tolerating full fluid diet.  Was taught how to change dressing and clean incision yesterday.  Reports did well.  Pain controlled with current meds.   Objective: Temp:  [97.7 F (36.5 C)-98.5 F (36.9 C)] 98.3 F (36.8 C) (04/09 2019) Pulse Rate:  [88-108] 108 (04/09 2019) Resp:  [17-18] 17 (04/09 2019) BP: (110-117)/(81-83) 117/81 (04/09 2019) SpO2:  [98 %-100 %] 98 % (04/09 2019)   Physical Exam: General: Alert and oriented, no apparent distress  Cardiovascular: RRR with no murmurs  noted Respiratory: CTA bilaterally, no wheezing or crackles  Gastrointestinal: Bowel sounds present. No abdominal pain. Abdominal dressing dry and intact   Laboratory: Recent Labs  Lab 12/06/19 0453 12/07/19 0428 12/08/19 0944  WBC 21.2* 19.4* 15.7*  HGB 10.9* 11.0* 11.1*  HCT 34.0* 34.5* 34.7*  PLT 654* 681* 694*   Recent Labs  Lab 12/04/19 0410 12/06/19 0453 12/07/19 0428  NA 145 136 134*  K 4.1 4.0 4.3  CL 104 102 100  CO2 28 23 23   BUN 25* 21* 20  CREATININE 0.77 0.76 0.77  CALCIUM 9.6 9.5 9.5  PROT 6.8  --  7.4  BILITOT 0.5  --  0.7  ALKPHOS 124  --  163*  ALT 20  --  58*  AST 19  --  34  GLUCOSE 109* 109* 109*      Imaging/Diagnostic Tests: No results found.  Carollee Leitz, MD 12/08/2019, 8:41 PM PGY-1, [place] Intern pager: [phone], text pages welcome

## 2019-12-08 NOTE — Progress Notes (Signed)
Nutrition Follow-up  DOCUMENTATION CODES:   Underweight  INTERVENTION:   -TPN per pharmacy; plan to d/c today -MVI with minerals daily, starting 12/09/19 -Ensure Enlive po TID, each supplement provides 350 kcal and 20 grams of protein -Magic cup TID with meals, each supplement provides 290 kcal and 9 grams of protein -RD will continue to follow for diet advancement and adjust supplement regimen as appropriate  NUTRITION DIAGNOSIS:   Increased nutrient needs related to post-op healing as evidenced by estimated needs.  Ongoing  GOAL:   Patient will meet greater than or equal to 90% of their needs  Progressing   MONITOR:   Diet advancement, Labs, Weight trends, Skin, I & O's  REASON FOR ASSESSMENT:   Consult New TPN/TNA  ASSESSMENT:   Eric Castillo. is a 44 y.o. male presenting with 2 to 3-day history of intermittent severe abdominal pain, constipation, vomiting.  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  11/29/19- s/pProcedure(s): EXPLORATORY LAPAROTOMY ILEOCECECTOMY  APPENDCECTOMY 4/1- PICC placed, TPN initiated 4/7- CT revealed pelvic abscess- images revealed pelvic collection had no target for drain; NGT removed 4/8- advanced to clear liquid diet 4/9- advanced to full liquid diet  Reviewed I/O's: -680 ml x 24 hours ans -2.2 L since admission  UOP: 1.6 L x 24 hours  Spoke with pt at bedside, who was pleasant and in good spirits today. He reports feeling better since having NGT out and is eager to eat. He reports good tolerance of clear liquids, however, "I sipped them too fast". He feels ready to try full liquid diet.   Discussed with pt what a full liquid diet consists of and potential for diet progression. Pt is interested in any and all RD recommendations. He likes [employer], but would like to try Ensure due to increased nutritional density. Pt shares he wants to "take is low, because I'm getting used to eating  again".   Per pharmacy note, plan to d/c TPN today.   Labs reviewed: Na: 134, K, Mg, and Phos WDL. CBGS: 99-111 (inpatient orders for glycemic control are 0-9 units insulin aspart every 8 hours).   Diet Order:   Diet Order            Diet full liquid Room service appropriate? Yes; Fluid consistency: Thin  Diet effective now              EDUCATION NEEDS:   Education needs have been addressed  Skin:  Skin Assessment: Skin Integrity Issues: Skin Integrity Issues:: Incisions Incisions: closed abdomen  Last BM:  12/05/19  Height:   Ht Readings from Last 1 Encounters:  11/29/19 6' (1.829 m)    Weight:   Wt Readings from Last 1 Encounters:  11/29/19 61.2 kg    Ideal Body Weight:  80.9 kg  BMI:  Body mass index is 18.3 kg/m.  Estimated Nutritional Needs:   Kcal:  8022-3361  Protein:  120-135 grams  Fluid:  > 2.2 L    Loistine Chance, RD, LDN, [place] Registered Dietitian II Certified Diabetes Care and Education Specialist Please refer to [facility] for RD and/or RD on-call/weekend/after hours pager

## 2019-12-08 NOTE — Plan of Care (Signed)
  Problem: Education: Goal: Knowledge of General Education information will improve Description: Including pain rating scale, medication(s)/side effects and non-pharmacologic comfort measures Outcome: Progressing   Problem: Clinical Measurements: Goal: Will remain free from infection Outcome: Progressing   Problem: Clinical Measurements: Goal: Diagnostic test results will improve Outcome: Progressing   Problem: Clinical Measurements: Goal: Respiratory complications will improve Outcome: Progressing   Problem: Nutrition: Goal: Adequate nutrition will be maintained Outcome: Progressing   Problem: Activity: Goal: Risk for activity intolerance will decrease Outcome: Progressing   Problem: Pain Managment: Goal: General experience of comfort will improve Outcome: Progressing   Problem: Skin Integrity: Goal: Risk for impaired skin integrity will decrease Outcome: Progressing

## 2019-12-08 NOTE — Progress Notes (Signed)
Occupational Therapy Treatment Patient Details Name: Eric Castillo. MRN: [id number] DOB: May 14, 1976 Today's Date: 12/08/2019    History of present illness Pt is 44 yo male presenting to ED with 2-3 day hx of abd/pain.  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 Pt is s/p exp lap, ileocecectomy, appendcectomy on 11/29/19 - note fascia close but skin left open with dressing.  Pt with hx of Crohn' disease.   OT comments  Patient continues to make steady progress towards goals in skilled OT session. Patient's session encompassed ADLs at the sink in order to promote independence while completing a functional task. Pt continues to progress with balance and activity tolerance when completing ADLs in standing, but noted frustration that he it takes so much out of him to get washed up. Continued to educate pt on energy conservation strategies as he recovers, with verbal agreement from pt. Discharge recommendations continue to remain appropriate; will continue to follow acutely.    Follow Up Recommendations  No OT follow up;Supervision - Intermittent    Equipment Recommendations  None recommended by OT    Recommendations for Other Services      Precautions / Restrictions Precautions Precautions: Fall Restrictions Weight Bearing Restrictions: No       Mobility Bed Mobility Overal bed mobility: Modified Independent Bed Mobility: Supine to Sit     Supine to sit: Modified independent (Device/Increase time)        Transfers Overall transfer level: Needs assistance Equipment used: None Transfers: Sit to/from Stand Sit to Stand: Supervision Stand pivot transfers: Supervision       General transfer comment: Safey and line managment    Balance Overall balance assessment: No apparent balance deficits (not formally assessed) Sitting-balance support: No upper extremity supported;Feet supported Sitting balance-Leahy Scale: Good     Standing balance support: No upper  extremity supported;Single extremity supported;During functional activity Standing balance-Leahy Scale: Good Standing balance comment: Would prop on one arm to bath lower legs, no LOB noted                           ADL either performed or assessed with clinical judgement   ADL Overall ADL's : Needs assistance/impaired     Grooming: Set up;Standing;Wash/dry hands;Wash/dry face;Oral care   Upper Body Bathing: Set up;Standing   Lower Body Bathing: Set up;Sit to/from stand Lower Body Bathing Details (indicate cue type and reason): Standing to complete Upper Body Dressing : Minimal assistance;Standing Upper Body Dressing Details (indicate cue type and reason): line mgmt  Lower Body Dressing: Set up;Sit to/from stand Lower Body Dressing Details (indicate cue type and reason): donning and doffing underwear in standing Toilet Transfer: Supervision/safety Toilet Transfer Details (indicate cue type and reason): simulated          Functional mobility during ADLs: Supervision/safety General ADL Comments: Pt able to stand for 20 minute ADL at sink in order to complete ADLs, no LOB noted     Vision       Perception     Praxis      Cognition Arousal/Alertness: Awake/alert Behavior During Therapy: WFL for tasks assessed/performed Overall Cognitive Status: Within Functional Limits for tasks assessed                                          Exercises     Shoulder Instructions  General Comments      Pertinent Vitals/ Pain       Pain Assessment: Faces Faces Pain Scale: Hurts a little bit Pain Location: stomach pain Pain Descriptors / Indicators: Cramping Pain Intervention(s): Limited activity within patient's tolerance;Monitored during session;Repositioned  Home Living                                          Prior Functioning/Environment              Frequency  Min 2X/week        Progress Toward Goals  OT  Goals(current goals can now be found in the care plan section)  Progress towards OT goals: Progressing toward goals  Acute Rehab OT Goals Patient Stated Goal: get stronger OT Goal Formulation: With patient Time For Goal Achievement: 12/15/19 Potential to Achieve Goals: Good  Plan Discharge plan remains appropriate    Co-evaluation                 AM-PAC OT "6 Clicks" Daily Activity     Outcome Measure   Help from another person eating meals?: None Help from another person taking care of personal grooming?: A Little Help from another person toileting, which includes using toliet, bedpan, or urinal?: A Little Help from another person bathing (including washing, rinsing, drying)?: A Little Help from another person to put on and taking off regular upper body clothing?: A Little Help from another person to put on and taking off regular lower body clothing?: A Little 6 Click Score: 19    End of Session    OT Visit Diagnosis: Pain   Activity Tolerance Patient tolerated treatment well   Patient Left Other (comment)(Seated EOB with table and call light in reach)   Nurse Communication Mobility status        Time: 6761-9509 OT Time Calculation (min): 26 min  Charges: OT General Charges $OT Visit: 1 Visit OT Treatments $Self Care/Home Management : 23-37 mins  Corinne Ports E. [place], [place] [facility] [place] 12/08/2019, 1:50 PM

## 2019-12-08 NOTE — Progress Notes (Signed)
PHARMACY - TOTAL PARENTERAL NUTRITION CONSULT NOTE   Indication: Prolonged ileus  Patient Measurements: Height: 6' (182.9 cm) Weight: 61.2 kg (134 lb 14.7 oz) IBW/kg (Calculated) : 77.6 TPN AdjBW (KG): 61.2 Body mass index is 18.3 kg/m.  Assessment: 44 year old gentleman with history of Crohn's disease, cannabis use and asthma presenting with several days of abdominal pain. Last bowel movement was 2 days PTA. 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 S/p Ex-Lap/ileocecectomy/appendicectomy 3/31. Anticipate a prolonged post-op ileus.  Pt is doing better with +flatus and +BM. Diet continues to be advanced.   Plan:  Weaning TPN to off Reduce TPN to 64m/hr now Turn TPN off at [address] PharmD, BCPS Clinical Pharmacist Please see AMION for all pharmacy numbers 12/08/2019 8:32 AM

## 2019-12-08 NOTE — TOC Progression Note (Signed)
Transition of Care (TOC) - Progression Note    Patient Details  Name: Eric Castillo. MRN: [id number] Date of Birth: 31-Dec-1975  Transition of Care [facility]) CM/SW Contact  Diar Berkel, Edson Snowball, RN Phone Number: 12/08/2019, 12:29 PM  Clinical Narrative:     Damaris Schooner to patient again regarding appointment at [facility] and wellness. Patient unsure if he is going to stay in [place] with his friend or return to [place]. Patient will cancel appointment if he returns to [place].    Discuss [place] letter . Patient voiced understanding.  Patient reports nursing has being teaching him wound care etc.   Expected Discharge Plan: Home/Self Care Barriers to Discharge: Continued Medical Work up  Expected Discharge Plan and Services Expected Discharge Plan: Home/Self Care In-house Referral: Financial Counselor Discharge Planning Services: CM Consult   Living arrangements for the past 2 months: Apartment                 DME Arranged: N/A         HH Arranged: NA           Social Determinants of Health (SDOH) Interventions    Readmission Risk Interventions Readmission Risk Prevention Plan 12/30/2018  Post Dischage Appt Complete  Medication Screening Complete  Transportation Screening Complete

## 2019-12-08 NOTE — Progress Notes (Signed)
Education performed with pt on mid abdominal open wound dressing changes to be performed at home by pt.  Pt did an excellent job at taking out the old dressing, sanitizing hands and then we reviewed a moist to dry dressing change twice daily.  I introduced him to skin prep and its protection of the skin surrounding the wound. He understands to wet the old dressing slightly if he has a hard time removing it from the wound bed.  Use of gauze and tape reviewed. Placed a small rectangular foam dressing over the old drain insertion site which has scabbed over and is healing well, explained that the foam can remain over that old drain site for 5 days.

## 2019-12-08 NOTE — Progress Notes (Signed)
Physical Therapy Treatment and Discharge Patient Details Name: Eric Castillo. MRN: [id number] DOB: March 20, 1976 Today's Date: 12/08/2019    History of Present Illness Pt is 44 yo male presenting to ED with 2-3 day hx of abd/pain.  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 Pt is s/p exp lap, ileocecectomy, appendcectomy on 11/29/19 - note fascia close but skin left open with dressing.  Pt with hx of Crohn' disease.    PT Comments    Pt tolerates treatment well ambulating independently for community distances. Pt expresses no significant pain at this time and feels good about his current mobility level, but does have questions about plan for return to his very active prior level of function. PT provides education on LE and low level core strengthening that can be performed during hospitalization independently. PT defers further core strengthening to medical team. PT provides education and recommendations on quantity of mobility for the rest of hospitalization, encouraging frequent walks out of the room (at least 5 times per day) and mobility in the room. Pt has met all acute PT goals at this time and is in agreement with discharge from the acute PT service. Please re-consult if functional status changes and PT services are warranted.  Follow Up Recommendations  No PT follow up     Equipment Recommendations  None recommended by PT    Recommendations for Other Services       Precautions / Restrictions Precautions Precautions: None Restrictions Weight Bearing Restrictions: No    Mobility  Bed Mobility Overal bed mobility: Independent Bed Mobility: Supine to Sit     Supine to sit: Independent        Transfers Overall transfer level: Independent Equipment used: None Transfers: Sit to/from Stand Sit to Stand: Independent Stand pivot transfers: Supervision       General transfer comment: Safey and line managment  Ambulation/Gait Ambulation/Gait assistance:  Independent Gait Distance (Feet): 2000 Feet Assistive device: IV Pole;None Gait Pattern/deviations: WFL(Within Functional Limits) Gait velocity: functional Gait velocity interpretation: >2.62 ft/sec, indicative of community ambulatory General Gait Details: pt ambulating with steady step through gait, up and down sloped terrain, no noticeable balance deviations, without use of IV pole for first 1500' and with use of IV pole for final 500.   Stairs Stairs: (pt expresses no concern for stair management)           Wheelchair Mobility    Modified Rankin (Stroke Patients Only)       Balance Overall balance assessment: No apparent balance deficits (not formally assessed) Sitting-balance support: No upper extremity supported;Feet supported Sitting balance-Leahy Scale: Normal     Standing balance support: During functional activity;No upper extremity supported Standing balance-Leahy Scale: Normal Standing balance comment: Would prop on one arm to bath lower legs, no LOB noted                            Cognition Arousal/Alertness: Awake/alert Behavior During Therapy: WFL for tasks assessed/performed Overall Cognitive Status: Within Functional Limits for tasks assessed                                        Exercises Other Exercises Other Exercises: PT provides education on gait progression, squat exercise in room, low level abdominal activation including abdominal drawing in maneuver and SLR with drawing in maneuver at bed level. PT defers  further core strengthening exercise at this time to medical team.    General Comments General comments (skin integrity, edema, etc.): pt tolerates treatment well, VSS      Pertinent Vitals/Pain Pain Assessment: Faces Faces Pain Scale: Hurts a little bit Pain Location: abdominal discomfort Pain Descriptors / Indicators: Grimacing Pain Intervention(s): Monitored during session    Home Living                       Prior Function            PT Goals (current goals can now be found in the care plan section) Acute Rehab PT Goals Patient Stated Goal: get stronger Progress towards PT goals: Goals met/education completed, patient discharged from PT    Frequency           PT Plan Current plan remains appropriate    Co-evaluation              AM-PAC PT "6 Clicks" Mobility   Outcome Measure  Help needed turning from your back to your side while in a flat bed without using bedrails?: None Help needed moving from lying on your back to sitting on the side of a flat bed without using bedrails?: None Help needed moving to and from a bed to a chair (including a wheelchair)?: None Help needed standing up from a chair using your arms (e.g., wheelchair or bedside chair)?: None Help needed to walk in hospital room?: None Help needed climbing 3-5 steps with a railing? : None 6 Click Score: 24    End of Session   Activity Tolerance: Patient tolerated treatment well Patient left: in bed;with call bell/phone within reach Nurse Communication: Mobility status       Time: 1440-1506 PT Time Calculation (min) (ACUTE ONLY): 26 min  Charges:  $Gait Training: 8-22 mins $Therapeutic Exercise: 8-22 mins                     Zenaida Niece, PT, DPT Acute Rehabilitation Pager: [phone]    Zenaida Niece 12/08/2019, 3:28 PM

## 2019-12-08 NOTE — Discharge Instructions (Signed)
[facility], [place] [phone]  OPEN ABDOMINAL SURGERY: POST OP INSTRUCTIONS  Always review your discharge instruction sheet given to you by the facility where your surgery was performed.  IF YOU HAVE DISABILITY OR FAMILY LEAVE FORMS, YOU MUST BRING THEM TO THE OFFICE FOR PROCESSING.  PLEASE DO NOT GIVE THEM TO YOUR DOCTOR.  1. A prescription for pain medication may be given to you upon discharge.  Take your pain medication as prescribed, if needed.  If narcotic pain medicine is not needed, then you may take acetaminophen (Tylenol) or ibuprofen (Advil) as needed. 2. Take your usually prescribed medications unless otherwise directed. 3. If you need a refill on your pain medication, please contact your pharmacy. They will contact our office to request authorization.  Prescriptions will not be filled after 5pm or on week-ends. 4. You should follow a light diet the first few days after arrival home, such as soup and crackers, pudding, etc.unless your doctor has advised otherwise. A high-fiber, low fat diet can be resumed as tolerated.   Be sure to include lots of fluids daily. Most patients will experience some swelling and bruising on the chest and neck area.  Ice packs will help.  Swelling and bruising can take several days to resolve 5. Most patients will experience some swelling and bruising in the area of the incision. Ice pack will help. Swelling and bruising can take several days to resolve..  6. It is common to experience some constipation if taking pain medication after surgery.  Increasing fluid intake and taking a stool softener will usually help or prevent this problem from occurring.  A mild laxative (Milk of Magnesia or Miralax) should be taken according to package directions if there are no bowel movements after 48 hours. 7.  You may have steri-strips (small skin tapes) in place directly over the incision.  These strips should be left on the skin for 7-10 days.  If your  surgeon used skin glue on the incision, you may shower in 24 hours.  The glue will flake off over the next 2-3 weeks.  Any sutures or staples will be removed at the office during your follow-up visit. You may find that a light gauze bandage over your incision may keep your staples from being rubbed or pulled. You may shower and replace the bandage daily. 8. ACTIVITIES:  You may resume regular (light) daily activities beginning the next day--such as daily self-care, walking, climbing stairs--gradually increasing activities as tolerated.  You may have sexual intercourse when it is comfortable.  Refrain from any heavy lifting or straining until approved by your doctor. a. You may drive when you no longer are taking prescription pain medication, you can comfortably wear a seatbelt, and you can safely maneuver your car and apply brakes  9. You should see your doctor in the office for a follow-up appointment approximately two weeks after your surgery.  Make sure that you call for this appointment within a day or two after you arrive home to insure a convenient appointment time.  WHEN TO CALL YOUR DOCTOR: 1. Fever over 101.0 2. Inability to urinate 3. Nausea and/or vomiting 4. Extreme swelling or bruising 5. Continued bleeding from incision. 6. Increased pain, redness, or drainage from the incision. 7. Difficulty swallowing or breathing 8. Muscle cramping or spasms. 9. Numbness or tingling in hands or feet or around lips.  The clinic staff is available to answer your questions during regular business hours.  Please  don't hesitate to call and ask to speak to one of the nurses if you have concerns.  For further questions, please visit [email or web address]

## 2019-12-08 NOTE — Progress Notes (Addendum)
Patient ID: Eric Castillo., male   DOB: Jan 29, 1976, 44 y.o.   MRN: [id number]    9 Days Post-Op  Subjective: Patient doing well today.  Ate his clears too quickly for breakfast yesterday, but slowed down thereafter and did much better.  Passing flatus and having soft BMs.  Mobilizing well  ROS: See above, otherwise other systems negative  Objective: Vital signs in last 24 hours: Temp:  [97.7 F (36.5 C)-98.3 F (36.8 C)] 97.7 F (36.5 C) (04/09 0554) Pulse Rate:  [88-99] 88 (04/09 0554) Resp:  [16-18] 17 (04/09 0554) BP: (114-143)/(83-89) 114/83 (04/09 0554) SpO2:  [98 %-100 %] 100 % (04/09 0554) Last BM Date: 12/05/19  Intake/Output from previous day: 04/08 0701 - 04/09 0700 In: 920 [P.O.:920] Out: 1600 [Urine:1600] Intake/Output this shift: No intake/output data recorded.  PE: Abd: soft, appropriately tender, midline incision is clean and packed.  +BS, ND  Lab Results:  Recent Labs    12/06/19 0453 12/07/19 0428  WBC 21.2* 19.4*  HGB 10.9* 11.0*  HCT 34.0* 34.5*  PLT 654* 681*   BMET Recent Labs    12/06/19 0453 12/07/19 0428  NA 136 134*  K 4.0 4.3  CL 102 100  CO2 23 23  GLUCOSE 109* 109*  BUN 21* 20  CREATININE 0.76 0.77  CALCIUM 9.5 9.5   PT/INR Recent Labs    12/06/19 0453  LABPROT 13.7  INR 1.1   CMP     Component Value Date/Time   NA 134 (L) 12/07/2019 0428   K 4.3 12/07/2019 0428   CL 100 12/07/2019 0428   CO2 23 12/07/2019 0428   GLUCOSE 109 (H) 12/07/2019 0428   BUN 20 12/07/2019 0428   CREATININE 0.77 12/07/2019 0428   CALCIUM 9.5 12/07/2019 0428   PROT 7.4 12/07/2019 0428   ALBUMIN 2.8 (L) 12/07/2019 0428   AST 34 12/07/2019 0428   ALT 58 (H) 12/07/2019 0428   ALKPHOS 163 (H) 12/07/2019 0428   BILITOT 0.7 12/07/2019 0428   GFRNONAA >60 12/07/2019 0428   GFRAA >60 12/07/2019 0428   Lipase     Component Value Date/Time   LIPASE 25 11/27/2019 0258       Studies/Results: CT ABDOMEN WO CONTRAST  Result Date:  12/06/2019 CLINICAL DATA:  44 year old male with a history of Crohn's and pelvic abscess on recent CT 12/05/2019. He presents today for attempt at possible drainage rectovesical fluid collection. EXAM: CT ABDOMEN WITHOUT CONTRAST TECHNIQUE: Multidetector CT imaging of the abdomen was performed following the standard protocol without IV contrast. COMPARISON:  12/05/2019 FINDINGS: Stomach/Bowel: Limited visualization of small bowel and colon, with enteric contrast present. No significant distention. Surgical changes again evident in the right lower quadrant. Vascular/Lymphatic: Unremarkable visualized vasculature, with minimal atherosclerosis. Surgical drain from anterior approach in place with evidence of prior midline laparotomy/surgery. Other: The initial targeted fluid collection in the rectovesical space is significantly decreased in size, with the greatest diameter now only 16 mm as to a prior measurement of 30 mm. Transverse dimension 5.4 cm versus previous of 6.6 cm. In addition, the more superior and right fluid collection appears read distributed/decreasing, though is poorly defined with the absence of IV contrast. Musculoskeletal: Unremarkable IMPRESSION: Fluid collection in the rectovesical space is decreased in size with no safe target for attempted drainage on the current CT. We did not attempt a transgluteal drain at this time. Signed, Dulcy Fanny. Dellia Nims, [employer] [facility] Electronically Signed   By: York Cerise  Earleen Newport D.O.   On: 12/06/2019 11:29    Anti-infectives: Anti-infectives (From admission, onward)   Start     Dose/Rate Route Frequency Ordered Stop   11/29/19 1500  piperacillin-tazobactam (ZOSYN) IVPB 3.375 g     3.375 g 12.5 mL/hr over 240 Minutes Intravenous Every 8 hours 11/29/19 1345     11/29/19 0600  ceFAZolin (ANCEF) IVPB 2g/100 mL premix     2 g 200 mL/hr over 30 Minutes Intravenous On call to O.R. 11/28/19 1334 11/29/19  0825       Assessment/Plan Tobacco abuse - 1 black and mild daily Asthma - albuterol inhaler PRN ABL anemia -stable Malnutrition - prealbumin 17/TNA  POD9, s/p ex lap with ileocecectomy, appendectomy, DB 3/31 for crohn's disease with small bowel stricture and fistula - surgical pathc/w crohn's disease -CT yesterday showed 2 collections - IR consulted but were unable to place drains today - mobilize,IS -adv to FLD -WBC pending, cont abx therapy -wean TNA to off today after this bag -RN to teach patient how to do his dressing changes   ID -zosyn 3/31>> FEN -IVF,FLD, wean TNA to off VTE -SCDs,lovenox Foley -d/c 4/1 Follow up -Dr. Ninfa Linden, GI   LOS: 11 days    Henreitta Cea , [facility] 12/08/2019, 8:20 AM Please see Amion for pager number during day hours 7:00am-4:30pm or 7:00am -11:30am on weekends

## 2019-12-08 NOTE — Plan of Care (Signed)
  Problem: Health Behavior/Discharge Planning: Goal: Ability to manage health-related needs will improve Outcome: Progressing   Problem: Clinical Measurements: Goal: Ability to maintain clinical measurements within normal limits will improve Outcome: Progressing Goal: Will remain free from infection Outcome: Progressing Goal: Diagnostic test results will improve Outcome: Progressing Goal: Respiratory complications will improve Outcome: Progressing Goal: Cardiovascular complication will be avoided Outcome: Progressing   Problem: Activity: Goal: Risk for activity intolerance will decrease Outcome: Progressing Note: Working with PT   Problem: Nutrition: Goal: Adequate nutrition will be maintained Outcome: Progressing Note: Ensure and increased to FLD   Problem: Coping: Goal: Level of anxiety will decrease Outcome: Progressing   Problem: Elimination: Goal: Will not experience complications related to bowel motility Outcome: Progressing Goal: Will not experience complications related to urinary retention Outcome: Progressing

## 2019-12-09 LAB — GLUCOSE, CAPILLARY
Glucose-Capillary: 79 mg/dL (ref 70–99)
Glucose-Capillary: 97 mg/dL (ref 70–99)
Glucose-Capillary: 107 mg/dL — ABNORMAL HIGH (ref 70–99)

## 2019-12-09 NOTE — Progress Notes (Signed)
10 Days Post-Op   Subjective/Chief Complaint: Complains of some spasms after eating but feels much better   Objective: Vital signs in last 24 hours: Temp:  [97.7 F (36.5 C)-98.5 F (36.9 C)] 97.7 F (36.5 C) (04/10 0425) Pulse Rate:  [91-108] 91 (04/10 0425) Resp:  [16-18] 16 (04/10 0425) BP: (110-120)/(81-82) 120/82 (04/10 0425) SpO2:  [98 %-100 %] 100 % (04/10 0425) Last BM Date: 12/05/19  Intake/Output from previous day: 04/09 0701 - 04/10 0700 In: 2878.2 [P.O.:1077; I.V.:1701.3; IV Piggyback:99.8] Out: 850 [Urine:850] Intake/Output this shift: Total I/O In: -  Out: 400 [Urine:400]  General appearance: alert and cooperative Resp: clear to auscultation bilaterally Cardio: regular rate and rhythm GI: soft, mild tenderness. open wound clean  Lab Results:  Recent Labs    12/07/19 0428 12/08/19 0944  WBC 19.4* 15.7*  HGB 11.0* 11.1*  HCT 34.5* 34.7*  PLT 681* 694*   BMET Recent Labs    12/07/19 0428  NA 134*  K 4.3  CL 100  CO2 23  GLUCOSE 109*  BUN 20  CREATININE 0.77  CALCIUM 9.5   PT/INR No results for input(s): LABPROT, INR in the last 72 hours. ABG No results for input(s): PHART, HCO3 in the last 72 hours.  Invalid input(s): PCO2, PO2  Studies/Results: No results found.  Anti-infectives: Anti-infectives (From admission, onward)   Start     Dose/Rate Route Frequency Ordered Stop   11/29/19 1500  piperacillin-tazobactam (ZOSYN) IVPB 3.375 g     3.375 g 12.5 mL/hr over 240 Minutes Intravenous Every 8 hours 11/29/19 1345     11/29/19 0600  ceFAZolin (ANCEF) IVPB 2g/100 mL premix     2 g 200 mL/hr over 30 Minutes Intravenous On call to O.R. 11/28/19 1334 11/29/19 0825      Assessment/Plan: s/p Procedure(s): EXPLORATORY LAPAROTOMY (N/A) Ileocecetomy (N/A) Appendectomy (N/A) Advance diet  Tobacco abuse - 1 black and mild daily Asthma - albuterol inhaler PRN ABL anemia -stable Malnutrition - prealbumin 17/TNA  POD10, s/p ex lap  with ileocecectomy, appendectomy, DB 3/31 for crohn's disease with small bowel stricture and fistula - surgical pathc/w crohn's disease -CT  showed 2 collections - IR consulted but were unable to place drains - mobilize,IS -adv to FLD -WBC trending down, Continue IV abx until wbc normalizes then can switch to orals -TNA is off  -RN to teach patient how to do his dressing changes   ID -zosyn 3/31>> FEN -IVF,FLD, wean TNA to off VTE -SCDs,lovenox Foley -d/c 4/1 Follow up -Dr. Ninfa Linden, GI  LOS: 12 days    Eric Castillo 12/09/2019

## 2019-12-09 NOTE — Progress Notes (Signed)
Family Medicine Teaching Service Daily Progress Note Intern Pager: [phone]  Patient name: Eric Castillo. Medical record number: [id number] Date of birth: [birth year] Age: 44 y.o. 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  Pelvic Abscess s/p Small Bowel Resection s/p Ex Lap/Ileocecectomy/Appendicectomy   POD#10 Continues to improve and remained afebrile. WBC 15.7>11. TPN discontinued on 4/9. Patient had bowel movement on 4/10. Advance to soft diet on 4/10 has been tolerating well.  Patient's pain is currently well controlled with oxycodone as needed, has not used as needed Dilaudid since 4/9. -D/c Dilaudid -Continue Oxycodone 5-10 mg q4h prn -Continue IV Zosyn (03/31-) per surgery -f/u CMP, Magnesium, and Phosphorus when available - ADAT per surgery - repeat AM CMP and CBC 4/12 -Monitor fever curve -Monitor po intake -Monitor for bowel movements -Incentive Spirometer q2h while awake -PT/OT following: No follow up recommeneded -OOB daily  FEN/GI:  -Soft diet  PPx: -Lovenox  Disposition: Home pending change to p.o. antibiotics and when cleared by surgery  Subjective:  Patient denies complaints morning.  States that he changed his own dressing this morning.  Notes his bowel movements of edema regular.  States that soft diet went well yesterday.  Looking forward to advancing diet today if surgery recommends.  Objective: Temp:  [97.8 F (36.6 C)-99 F (37.2 C)] 97.8 F (36.6 C) (04/11 0407) Pulse Rate:  [78-110] 78 (04/11 0407) Resp:  [16-17] 16 (04/11 0407) BP: (111-133)/(77-91) 111/83 (04/11  0407) SpO2:  [100 %] 100 % (04/11 0407)   Physical Exam:  General: 44 y.o. male in NAD Cardio: RRR Lungs: CTAB, no wheezing, no rhonchi, no crackles, no IWOB on RA Abdomen: Soft, non-distended, positive bowel sounds, 2 abdominal bandages, one at midline, 1 in left lower quadrant, no surrounding erythema Skin: warm and dry Extremities: No edema    Laboratory: Recent Labs  Lab 12/07/19 0428 12/08/19 0944 12/10/19 0441  WBC 19.4* 15.7* 11.0*  HGB 11.0* 11.1* 10.4*  HCT 34.5* 34.7* 32.4*  PLT 681* 694* 730*   Recent Labs  Lab 12/04/19 0410 12/06/19 0453 12/07/19 0428  NA 145 136 134*  K 4.1 4.0 4.3  CL 104 102 100  CO2 28 23 23   BUN 25* 21* 20  CREATININE 0.77 0.76 0.77  CALCIUM 9.6 9.5 9.5  PROT 6.8  --  7.4  BILITOT 0.5  --  0.7  ALKPHOS 124  --  163*  ALT 20  --  58*  AST 19  --  34  GLUCOSE 109* 109* 109*      Imaging/Diagnostic Tests: No results found.  Crum, DO 12/10/2019, 5:55 AM PGY-2, [place] Intern pager: [phone], text pages welcome

## 2019-12-10 LAB — COMPREHENSIVE METABOLIC PANEL
ALT: 127 U/L — ABNORMAL HIGH (ref 0–44)
AST: 75 U/L — ABNORMAL HIGH (ref 15–41)
Albumin: 2.8 g/dL — ABNORMAL LOW (ref 3.5–5.0)
Alkaline Phosphatase: 296 U/L — ABNORMAL HIGH (ref 38–126)
Anion gap: 13 (ref 5–15)
BUN: 21 mg/dL — ABNORMAL HIGH (ref 6–20)
CO2: 24 mmol/L (ref 22–32)
Calcium: 9.5 mg/dL (ref 8.9–10.3)
Chloride: 98 mmol/L (ref 98–111)
Creatinine, Ser: 0.95 mg/dL (ref 0.61–1.24)
GFR calc Af Amer: >60 mL/min (ref >60)
GFR calc non Af Amer: >60 mL/min (ref >60)
Glucose, Bld: 91 mg/dL (ref 70–99)
Potassium: 4 mmol/L (ref 3.5–5.1)
Sodium: 135 mmol/L (ref 135–145)
Total Bilirubin: 0.5 mg/dL (ref 0.3–1.2)
Total Protein: 6.9 g/dL (ref 6.5–8.1)

## 2019-12-10 LAB — CBC
HCT: 32.4 % — ABNORMAL LOW (ref 39.0–52.0)
Hemoglobin: 10.4 g/dL — ABNORMAL LOW (ref 13.0–17.0)
MCH: 28.3 pg (ref 26.0–34.0)
MCHC: 32.1 g/dL (ref 30.0–36.0)
MCV: 88 fL (ref 80.0–100.0)
Platelets: 730 10*3/uL — ABNORMAL HIGH (ref 150–400)
RBC: 3.68 MIL/uL — ABNORMAL LOW (ref 4.22–5.81)
RDW: 18.2 % — ABNORMAL HIGH (ref 11.5–15.5)
WBC: 11 10*3/uL — ABNORMAL HIGH (ref 4.0–10.5)
nRBC: 0 % (ref 0.0–0.2)

## 2019-12-10 LAB — GLUCOSE, CAPILLARY
Glucose-Capillary: 79 mg/dL (ref 70–99)
Glucose-Capillary: 91 mg/dL (ref 70–99)
Glucose-Capillary: 95 mg/dL (ref 70–99)

## 2019-12-10 LAB — MAGNESIUM: Magnesium: 1.9 mg/dL (ref 1.7–2.4)

## 2019-12-10 LAB — PHOSPHORUS: Phosphorus: 3.8 mg/dL (ref 2.5–4.6)

## 2019-12-10 MED ORDER — AMOXICILLIN-POT CLAVULANATE 875-125 MG PO TABS: 1.0000 | ORAL_TABLET | Freq: Two times a day (BID) | ORAL | Status: DC

## 2019-12-10 MED ORDER — AMOXICILLIN-POT CLAVULANATE 875-125 MG PO TABS
1.0000 | ORAL_TABLET | Freq: Two times a day (BID) | ORAL | Status: DC
  Administered 2019-12-10 – 2019-12-11 (×3): 1 via ORAL
  Filled 2019-12-10 (×3): qty 1

## 2019-12-10 NOTE — Plan of Care (Signed)
  Problem: Activity: Goal: Risk for activity intolerance will decrease Outcome: Progressing   Problem: Nutrition: Goal: Adequate nutrition will be maintained Outcome: Progressing   Problem: Elimination: Goal: Will not experience complications related to bowel motility Outcome: Progressing

## 2019-12-10 NOTE — Progress Notes (Addendum)
Received page that patient was upset and that he had informed nursing that he would be leaving AMA due to concerns for his possessions not being in the proper care. I reported to the bedside in order to gather more information from the patient.  Patient reports that he is originally from [place] and has vital records that were issued from that state.  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  He states that prior to being admitted to the hospital, he had been homeless.  Patient reports that his significant other had originally agreed to be responsible for his belongings but had recently informed him that she was unsure of the whereabouts of some of his belongings and that he would no longer be able to stay with her and her parents after discharge.  This angered the patient and he reports that he needs to go retrieve his belongings and find a different place to stay.  Patient expresses clear understanding that leaving the hospital before completing medical evaluation could results in  worsening of his abdominal pain/infection or death if he has an untreated.  Patient is initially tearful but ultimately is agreeable to staying overnight if we can find resources for him in regards to a place to stay once he is discharged.  I informed the patient that I would discuss his case with social work and provide him with updates regarding his options.  Patient is not medically cleared for discharge from surgery nor primary team at this point in time.  Patient states that he is understanding of this and agreeable to this plan for now.  Spoke with Reed Breech, LCSW via telephone and was informed that there are shelters available on a first-come, first serve basis but that contacting these places on the weekend is usually quite challenging and does not results in much of a response. Baxter Flattery informed me that she would  provided the patient with resources prior to the end of today. Called patient and informed him of these options via telephone to which he was agreeable to stay until 4/12  Discussed case with Dr. Dema Severin on call for general surgery regarding an update on treatment plan recommendations.  It was recommended that the patient be transitioned to oral antibiotic, Augmentin, in Zosyn discontinued as it may be contributing to elevated hepatic enzymes.  Dr. Dema Severin also added that the patient would not need an additional abdominal CT scan and as long as he was tolerating an oral diet, afebrile, and no leukocytosis that he would be stable for discharge after 24 hours on oral antibiotics. -dc Zocysn  -order Augmentin   Stark Klein, MD  PGY-1, [place] Intern Pager [phone]

## 2019-12-10 NOTE — Progress Notes (Signed)
11 Days Post-Op   Subjective/Chief Complaint: No complaints   Objective: Vital signs in last 24 hours: Temp:  [97.8 F (36.6 C)-99 F (37.2 C)] 97.8 F (36.6 C) (04/11 0407) Pulse Rate:  [78-110] 78 (04/11 0407) Resp:  [16-17] 16 (04/11 0407) BP: (111-133)/(77-91) 111/83 (04/11 0407) SpO2:  [100 %] 100 % (04/11 0407) Last BM Date: 12/09/19  Intake/Output from previous day: 04/10 0701 - 04/11 0700 In: 1664.2 [P.O.:1417; IV Piggyback:247.2] Out: 400 [Urine:400] Intake/Output this shift: No intake/output data recorded.  General appearance: alert and cooperative Resp: clear to auscultation bilaterally Cardio: regular rate and rhythm GI: soft, nontender. wound clean. having bm's  Lab Results:  Recent Labs    12/08/19 0944 12/10/19 0441  WBC 15.7* 11.0*  HGB 11.1* 10.4*  HCT 34.7* 32.4*  PLT 694* 730*   BMET Recent Labs    12/10/19 0617  NA 135  K 4.0  CL 98  CO2 24  GLUCOSE 91  BUN 21*  CREATININE 0.95  CALCIUM 9.5   PT/INR No results for input(s): LABPROT, INR in the last 72 hours. ABG No results for input(s): PHART, HCO3 in the last 72 hours.  Invalid input(s): PCO2, PO2  Studies/Results: No results found.  Anti-infectives: Anti-infectives (From admission, onward)   Start     Dose/Rate Route Frequency Ordered Stop   11/29/19 1500  piperacillin-tazobactam (ZOSYN) IVPB 3.375 g     3.375 g 12.5 mL/hr over 240 Minutes Intravenous Every 8 hours 11/29/19 1345     11/29/19 0600  ceFAZolin (ANCEF) IVPB 2g/100 mL premix     2 g 200 mL/hr over 30 Minutes Intravenous On call to O.R. 11/28/19 1334 11/29/19 0825      Assessment/Plan: s/p Procedure(s): EXPLORATORY LAPAROTOMY (N/A) Ileocecetomy (N/A) Appendectomy (N/A) Advance diet  Wbc down to 11. Consider switching to oral abx and home soon if he continues to do well Dressing changes May shower  LOS: 13 days    Autumn Messing III 12/10/2019

## 2019-12-10 NOTE — TOC Progression Note (Signed)
Transition of Care (TOC) - Progression Note    Patient Details  Name: Eric Castillo. MRN: [id number] Date of Birth: 1976/03/07  Transition of Care [facility]) CM/SW [place], [place] Phone Number: 12/10/2019, 3:44 PM  Clinical Narrative:     CSW was consulted by physician concerning resources for homelessness.  CSW met with pt at bedside.  CSW inquired if there was some place he could stay after his discharge.  Pt stated " his last relative/friend in the area died 10-25-2022 other relatives are up [place]. Pt reviewed resources that were given and placed on the bed. TOC team will continue to follow for disposition.    Expected Discharge Plan: Home/Self Care Barriers to Discharge: Continued Medical Work up  Expected Discharge Plan and Services Expected Discharge Plan: Home/Self Care In-house Referral: Financial Counselor Discharge Planning Services: CM Consult   Living arrangements for the past 2 months: Apartment                 DME Arranged: N/A         HH Arranged: NA           Social Determinants of Health (SDOH) Interventions    Readmission Risk Interventions Readmission Risk Prevention Plan 12/30/2018  Post Dischage Appt Complete  Medication Screening Complete  Transportation Screening Complete

## 2019-12-11 LAB — COMPREHENSIVE METABOLIC PANEL
ALT: 93 U/L — ABNORMAL HIGH (ref 0–44)
AST: 39 U/L (ref 15–41)
Albumin: 2.7 g/dL — ABNORMAL LOW (ref 3.5–5.0)
Alkaline Phosphatase: 240 U/L — ABNORMAL HIGH (ref 38–126)
Anion gap: 10 (ref 5–15)
BUN: 19 mg/dL (ref 6–20)
CO2: 27 mmol/L (ref 22–32)
Calcium: 9.5 mg/dL (ref 8.9–10.3)
Chloride: 101 mmol/L (ref 98–111)
Creatinine, Ser: 0.85 mg/dL (ref 0.61–1.24)
GFR calc Af Amer: >60 mL/min (ref >60)
GFR calc non Af Amer: >60 mL/min (ref >60)
Glucose, Bld: 105 mg/dL — ABNORMAL HIGH (ref 70–99)
Potassium: 4.2 mmol/L (ref 3.5–5.1)
Sodium: 138 mmol/L (ref 135–145)
Total Bilirubin: 0.6 mg/dL (ref 0.3–1.2)
Total Protein: 6.6 g/dL (ref 6.5–8.1)

## 2019-12-11 LAB — CBC
HCT: 31.7 % — ABNORMAL LOW (ref 39.0–52.0)
Hemoglobin: 10.1 g/dL — ABNORMAL LOW (ref 13.0–17.0)
MCH: 28.1 pg (ref 26.0–34.0)
MCHC: 31.9 g/dL (ref 30.0–36.0)
MCV: 88.1 fL (ref 80.0–100.0)
Platelets: 711 10*3/uL — ABNORMAL HIGH (ref 150–400)
RBC: 3.6 MIL/uL — ABNORMAL LOW (ref 4.22–5.81)
RDW: 18.1 % — ABNORMAL HIGH (ref 11.5–15.5)
WBC: 9.8 10*3/uL (ref 4.0–10.5)
nRBC: 0 % (ref 0.0–0.2)

## 2019-12-11 LAB — GLUCOSE, CAPILLARY
Glucose-Capillary: 92 mg/dL (ref 70–99)
Glucose-Capillary: 77 mg/dL (ref 70–99)

## 2019-12-11 MED ORDER — ACETAMINOPHEN 500 MG PO TABS: 1000.0000 mg | ORAL_TABLET | Freq: Four times a day (QID) | ORAL | Status: DC | PRN

## 2019-12-11 MED ORDER — PANTOPRAZOLE SODIUM 40 MG PO TBEC: 40.0000 mg | DELAYED_RELEASE_TABLET | Freq: Every day | ORAL | 0 refills | Status: DC

## 2019-12-11 MED ORDER — OXYCODONE HCL 5 MG PO TABS: 5.0000 mg | ORAL_TABLET | ORAL | 0 refills | Status: DC | PRN

## 2019-12-11 NOTE — Progress Notes (Signed)
[name]. to be D/C'd per MD order. Discussed with the patient and all questions fully answered. ? VSS, Skin clean, dry and intact without evidence of skin break down, no evidence of skin tears noted. ? PICC line discontinued intact. Site without signs and symptoms of complications. Dressing and pressure applied. ? An After Visit Summary was printed and given to the patient. Patient informed where to pickup prescriptions. ? D/c education completed with patient/family including follow up instructions, medication list, d/c activities limitations if indicated, with other d/c instructions as indicated by MD - patient able to verbalize understanding, all questions fully answered.  ? Patient instructed to return to ED, call 911, or call MD for any changes in condition.  ? Patient to be escorted via [place], and D/C home via private auto.

## 2019-12-11 NOTE — Progress Notes (Signed)
Patient ID: Eric Castillo., male   DOB: 01-Feb-1976, 44 y.o.   MRN: [id number]    12 Days Post-Op  Subjective: Patient doing well.  Tolerating a soft diet with several BMs.  Abdominal pain well controlled.  Doing dressing changes himself  ROS: See above, otherwise other systems negative  Objective: Vital signs in last 24 hours: Temp:  [98.1 F (36.7 C)-98.8 F (37.1 C)] 98.3 F (36.8 C) (04/12 0403) Pulse Rate:  [90-109] 90 (04/12 0403) Resp:  [17-18] 18 (04/12 0403) BP: (118-135)/(78-98) 118/78 (04/12 0403) SpO2:  [100 %] 100 % (04/12 0403) Last BM Date: 12/10/19  Intake/Output from previous day: 04/11 0701 - 04/12 0700 In: 1200 [P.O.:1200] Out: 750 [Urine:750] Intake/Output this shift: No intake/output data recorded.  PE: Abd: soft, appropriately tender, midline wound is clean and packed, +BS  Lab Results:  Recent Labs    12/10/19 0441 12/11/19 0349  WBC 11.0* 9.8  HGB 10.4* 10.1*  HCT 32.4* 31.7*  PLT 730* 711*   BMET Recent Labs    12/10/19 0617 12/11/19 0349  NA 135 138  K 4.0 4.2  CL 98 101  CO2 24 27  GLUCOSE 91 105*  BUN 21* 19  CREATININE 0.95 0.85  CALCIUM 9.5 9.5   PT/INR No results for input(s): LABPROT, INR in the last 72 hours. CMP     Component Value Date/Time   NA 138 12/11/2019 0349   K 4.2 12/11/2019 0349   CL 101 12/11/2019 0349   CO2 27 12/11/2019 0349   GLUCOSE 105 (H) 12/11/2019 0349   BUN 19 12/11/2019 0349   CREATININE 0.85 12/11/2019 0349   CALCIUM 9.5 12/11/2019 0349   PROT 6.6 12/11/2019 0349   ALBUMIN 2.7 (L) 12/11/2019 0349   AST 39 12/11/2019 0349   ALT 93 (H) 12/11/2019 0349   ALKPHOS 240 (H) 12/11/2019 0349   BILITOT 0.6 12/11/2019 0349   GFRNONAA >60 12/11/2019 0349   GFRAA >60 12/11/2019 0349   Lipase     Component Value Date/Time   LIPASE 25 11/27/2019 0258       Studies/Results: No results found.  Anti-infectives: Anti-infectives (From admission, onward)   Start     Dose/Rate Route  Frequency Ordered Stop   12/10/19 1430  amoxicillin-clavulanate (AUGMENTIN) 875-125 MG per tablet 1 tablet     1 tablet Oral 2 times daily 12/10/19 1346 12/20/19 0959   12/10/19 1315  amoxicillin-clavulanate (AUGMENTIN) 875-125 MG per tablet 1 tablet  Status:  Discontinued     1 tablet Oral 2 times daily 12/10/19 1308 12/10/19 1346   11/29/19 1500  piperacillin-tazobactam (ZOSYN) IVPB 3.375 g  Status:  Discontinued     3.375 g 12.5 mL/hr over 240 Minutes Intravenous Every 8 hours 11/29/19 1345 12/10/19 1308   11/29/19 0600  ceFAZolin (ANCEF) IVPB 2g/100 mL premix     2 g 200 mL/hr over 30 Minutes Intravenous On call to O.R. 11/28/19 1334 11/29/19 0825       Assessment/Plan Tobacco abuse - 1 black and mild daily Asthma - albuterol inhaler PRN ABL anemia -stable Malnutrition - prealbumin 17/TNA  POD12, s/p ex lap with ileocecectomy, appendectomy, DB 3/31 for crohn's disease with small bowel stricture and fistula - surgical pathc/w crohn's disease -CT yesterday showed 2 collections - IR consulted but were unable to place drains today - mobilize,IS -tolerating soft diet -WBC normal.  No further abx therapy warranted at this time -stable for DC home.   -Rx for pain medication has been sent by  myself.  He has follow up arranged with Dr. Ninfa Linden as well. -he seems to have a good understanding of how to do his dressing change.   ID -zosyn 3/31>>4/11, agumentin 4/11 --> this can be stopped at DC.  Does not need any further FEN -soft diet VTE -SCDs,lovenox Foley -d/c 4/1 Follow up -Dr. Ninfa Linden, GI   LOS: 14 days    Henreitta Cea , [facility] Surgery 12/11/2019, 9:19 AM Please see Amion for pager number during day hours 7:00am-4:30pm or 7:00am -11:30am on weekends

## 2019-12-11 NOTE — Progress Notes (Signed)
Brief progress note: I will cosign the resident's note once it is completed.  He denies any pain this morning. He is tolerating oral meals. He is more concerned about his discharge plan.  As discussed with him, we will await surgery's recommendation. If they clear him for d/c today, he can be discharged with close outpatient monitoring.  His Liver enzymes trended down after d/cing Zosyn. I advised him to f/u with his PCP for this. He agreed with the plan.

## 2019-12-13 ENCOUNTER — Ambulatory Visit: Payer: Self-pay | Attending: Family Medicine | Admitting: Physician Assistant

## 2019-12-13 ENCOUNTER — Other Ambulatory Visit: Payer: Self-pay

## 2019-12-13 DIAGNOSIS — Z720 Tobacco use: Secondary | ICD-10-CM

## 2019-12-13 DIAGNOSIS — K50012 Crohn's disease of small intestine with intestinal obstruction: Secondary | ICD-10-CM

## 2019-12-13 DIAGNOSIS — F329 Major depressive disorder, single episode, unspecified: Secondary | ICD-10-CM

## 2019-12-13 DIAGNOSIS — F4010 Social phobia, unspecified: Secondary | ICD-10-CM

## 2019-12-13 DIAGNOSIS — F32A Depression, unspecified: Secondary | ICD-10-CM

## 2019-12-13 DIAGNOSIS — K56609 Unspecified intestinal obstruction, unspecified as to partial versus complete obstruction: Secondary | ICD-10-CM

## 2019-12-13 DIAGNOSIS — Z09 Encounter for follow-up examination after completed treatment for conditions other than malignant neoplasm: Secondary | ICD-10-CM

## 2019-12-13 NOTE — Progress Notes (Signed)
HFU

## 2019-12-13 NOTE — Progress Notes (Signed)
Patient ID: Eric Crewe., male   DOB: Nov 24, 1975, 44 y.o.   MRN: [id number]  Virtual Visit via Telephone Note  I connected with Eric Castillo. on 12/13/19 at 10:10 AM EDT by telephone and verified that I am speaking with the correct person using two identifiers.   I discussed the limitations, risks, security and privacy concerns of performing an evaluation and management service by telephone and the availability of in person appointments. I also discussed with the patient that there may be a patient responsible charge related to this service. The patient expressed understanding and agreed to proceed.  PATIENT visit by telephone virtually in the context of Covid-19 pandemic. Patient location:  home My Location:  [facility] office Persons on the call:  Me and the patient  History of Present Illness: After hospitalization 3/29-4/07/2020 with SBO.   Appetite is improving.  He has had a BM since leaving the hospital.  He has no insurance currently.  He is slowly graduating diet.  No fever.  No N/V/D.  He has f/up appt with the surgeon.    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   He has recently lost his job.  His dad died in October 28, 2022 and he has lost many other family members over the last 5 years.    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  SBO with Crohn's Disease Admission details can be found in H&P. 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 Patient started on standard SBO protocol. GI consulted given patient's severe Crohn's, who recommended Solumedrol and surgery consultation for consideration of surgical  resection. Surgery was consulted and recommended exploratory laparotomy forileocecectomy. Patient was taken to the OR on 3/31 for Ex Lap with ileocecetomy, appendectomy.  On POD#5 he had BM.  Patient was on TPN until 4/9 when he successfully transitioned to liquid diet. Per surgery's recommendations, patient was continued on IV Zosyn from 3/31 until 12/10/19. His WBC continued to increase  to a max of 21 and so abdominal and pelvic CT was completed and showed two fluid collections in his pelvis. The patient was taken to the OR for an attempt to place drains for  the collections of fluid by IR on 12/06/19 but the collections had decreased in size so no drains were placed. The patient was afebrile and his white blood cell count normalized to 9.9 by the time of discharge so he was continued on IV Zocysn and transitioned to PO Augmentin on 4/10 for a total of 2 days. Augmentin was not continued at the time of discharge per surgery's recommendation.   Issues for Follow Up:  1. Patient is to follow-up with general surgery as scheduled. 2. Patient had elevated liver enzymes and alk phos.  These were down trending at the time of discharge.  Recommend checking CMP to ensure that these values are returned to baseline.   Observations/Objective:  NAD.  A&Ox3   Assessment and Plan:   1. Crohn's disease of ileum with intestinal obstruction ([place]) Doing well;  resolved  2. SBO (small bowel obstruction) ([place]) Doing well;  resolved  3. Tobacco use Smoking and dangers of nicotine have been discussed at length. Long term health  consequences of smoking reviewed in detail.  Methods for helping with cessation have been reviewed.  Patient expresses understanding.   4. Hospital discharge follow-up Doing well;  resolved  5. Depression, unspecified depression type -self-care counseling on grief discussed  - Ambulatory referral to Social Work  6. Social fears - Ambulatory referral to Social Work  Follow Up  Instructions: Come and get financial packet.  Assign PCP in 2-3 weeks;  Check CMP   I discussed the assessment and treatment plan with the patient. The patient was provided an opportunity to ask questions and all were answered. The patient agreed with the plan and demonstrated an understanding of the instructions.   The patient was advised to call back or seek an in-person evaluation if the symptoms worsen or if the condition fails to improve as anticipated.  I provided 14 minutes of non-face-to-face time during this encounter.   Freeman Caldron, PA-C

## 2019-12-20 ENCOUNTER — Telehealth: Payer: Self-pay | Admitting: Licensed Clinical Social Worker

## 2019-12-20 NOTE — Telephone Encounter (Signed)
Call placed to patient regarding [facility] referral. LCSW left message requesting return call.

## 2020-01-11 ENCOUNTER — Ambulatory Visit: Payer: Self-pay | Admitting: Internal Medicine

## 2020-01-16 ENCOUNTER — Other Ambulatory Visit (INDEPENDENT_AMBULATORY_CARE_PROVIDER_SITE_OTHER): Payer: Self-pay

## 2020-01-16 ENCOUNTER — Encounter: Payer: Self-pay | Admitting: Internal Medicine

## 2020-01-16 ENCOUNTER — Ambulatory Visit (INDEPENDENT_AMBULATORY_CARE_PROVIDER_SITE_OTHER): Payer: Self-pay | Admitting: Internal Medicine

## 2020-01-16 VITALS — BP 100/68 | HR 76 | Ht 72.0 in | Wt 136.0 lb

## 2020-01-16 DIAGNOSIS — R7989 Other specified abnormal findings of blood chemistry: Secondary | ICD-10-CM

## 2020-01-16 DIAGNOSIS — K9089 Other intestinal malabsorption: Secondary | ICD-10-CM

## 2020-01-16 DIAGNOSIS — K50012 Crohn's disease of small intestine with intestinal obstruction: Secondary | ICD-10-CM

## 2020-01-16 DIAGNOSIS — D649 Anemia, unspecified: Secondary | ICD-10-CM

## 2020-01-16 DIAGNOSIS — R945 Abnormal results of liver function studies: Secondary | ICD-10-CM

## 2020-01-16 LAB — CBC WITH DIFFERENTIAL/PLATELET
Basophils Absolute: 0.1 10*3/uL (ref 0.0–0.1)
Basophils Relative: 1 % (ref 0.0–3.0)
Eosinophils Absolute: 1 10*3/uL — ABNORMAL HIGH (ref 0.0–0.7)
Eosinophils Relative: 9.3 % — ABNORMAL HIGH (ref 0.0–5.0)
HCT: 36.5 % — ABNORMAL LOW (ref 39.0–52.0)
Hemoglobin: 11.7 g/dL — ABNORMAL LOW (ref 13.0–17.0)
Lymphocytes Relative: 9.8 % — ABNORMAL LOW (ref 12.0–46.0)
Lymphs Abs: 1.1 10*3/uL (ref 0.7–4.0)
MCHC: 32.1 g/dL (ref 30.0–36.0)
MCV: 84.8 fl (ref 78.0–100.0)
Monocytes Absolute: 1.1 10*3/uL — ABNORMAL HIGH (ref 0.1–1.0)
Monocytes Relative: 10.5 % (ref 3.0–12.0)
Neutro Abs: 7.6 10*3/uL (ref 1.4–7.7)
Neutrophils Relative %: 69.4 % (ref 43.0–77.0)
Platelets: 368 10*3/uL (ref 150.0–400.0)
RBC: 4.31 Mil/uL (ref 4.22–5.81)
RDW: 18.3 % — ABNORMAL HIGH (ref 11.5–15.5)
WBC: 10.9 10*3/uL — ABNORMAL HIGH (ref 4.0–10.5)

## 2020-01-16 LAB — COMPREHENSIVE METABOLIC PANEL
ALT: 5 U/L (ref 0–53)
AST: 11 U/L (ref 0–37)
Albumin: 3.8 g/dL (ref 3.5–5.2)
Alkaline Phosphatase: 74 U/L (ref 39–117)
BUN: 7 mg/dL (ref 6–23)
CO2: 32 mEq/L (ref 19–32)
Calcium: 9 mg/dL (ref 8.4–10.5)
Chloride: 107 mEq/L (ref 96–112)
Creatinine, Ser: 0.77 mg/dL (ref 0.40–1.50)
GFR: 133.68 mL/min (ref >60.00)
Glucose, Bld: 79 mg/dL (ref 70–99)
Potassium: 3.2 mEq/L — ABNORMAL LOW (ref 3.5–5.1)
Sodium: 141 mEq/L (ref 135–145)
Total Bilirubin: 0.2 mg/dL (ref 0.2–1.2)
Total Protein: 6.9 g/dL (ref 6.0–8.3)

## 2020-01-16 MED ORDER — CHOLESTYRAMINE 4 GM/DOSE PO POWD: 4.0000 g | Freq: Two times a day (BID) | ORAL | 1 refills | Status: DC

## 2020-01-16 MED ORDER — ALBUTEROL SULFATE HFA 108 (90 BASE) MCG/ACT IN AERS: 1.0000 | INHALATION_SPRAY | Freq: Four times a day (QID) | RESPIRATORY_TRACT | 0 refills | Status: DC | PRN

## 2020-01-16 NOTE — Patient Instructions (Signed)
Your provider has requested that you go to the basement level for lab work before leaving today. Press "B" on the elevator. The lab is located at the first door on the left as you exit the elevator.   We have sent the following medications to your pharmacy for you to pick up at your convenience: Cholestyramine, albuterol If you get constipated stop the cholestyramiine and  call us.   Stop your pantoprazole.   Call your surgeon's office about getting refills on your pain rx.   Please come back to see Dr Carlean Purl in 6-8 weeks.   I appreciate the opportunity to care for you. Silvano Rusk, MD, [facility]
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
## 2020-01-17 ENCOUNTER — Other Ambulatory Visit: Payer: Self-pay

## 2020-01-17 ENCOUNTER — Encounter: Payer: Self-pay | Admitting: Internal Medicine

## 2020-01-17 DIAGNOSIS — D500 Iron deficiency anemia secondary to blood loss (chronic): Secondary | ICD-10-CM

## 2020-01-17 DIAGNOSIS — E538 Deficiency of other specified B group vitamins: Secondary | ICD-10-CM | POA: Insufficient documentation

## 2020-01-17 HISTORY — DX: Iron deficiency anemia secondary to blood loss (chronic): D50.0

## 2020-01-17 LAB — VITAMIN B12: Vitamin B-12: 230 pg/mL (ref 211–911)

## 2020-01-17 LAB — FERRITIN: Ferritin: 5.6 ng/mL — ABNORMAL LOW (ref 22.0–322.0)

## 2020-01-17 MED ORDER — POTASSIUM CHLORIDE CRYS ER 20 MEQ PO TBCR: 20.0000 meq | EXTENDED_RELEASE_TABLET | Freq: Once | ORAL | 1 refills | Status: DC

## 2020-01-17 MED ORDER — CYANOCOBALAMIN 1000 MCG/ML IJ SOLN: 1000.0000 ug | Freq: Once | INTRAMUSCULAR | 0 refills | Status: AC

## 2020-01-17 NOTE — Progress Notes (Signed)
Let him know this:  Iron low - Tx w/ ferrous sulfate 325 mg qd OTC  K low - probably from diarrhe - K-dur 20 mEq qd # 30 1 refill  B12 almost low - oral 1000 ug daily OTC  Set him up for a 1 time 1000 ug OM or subcutaneous injection in office - can Rx the injection to his pharmacy if needed

## 2020-02-28 ENCOUNTER — Ambulatory Visit: Payer: Self-pay | Admitting: Internal Medicine

## 2020-03-13 ENCOUNTER — Institutional Professional Consult (permissible substitution): Payer: Self-pay | Admitting: Licensed Clinical Social Worker

## 2020-03-15 ENCOUNTER — Institutional Professional Consult (permissible substitution): Payer: Self-pay | Admitting: Licensed Clinical Social Worker

## 2020-03-15 ENCOUNTER — Telehealth: Payer: Self-pay | Admitting: Licensed Clinical Social Worker

## 2020-03-15 NOTE — Telephone Encounter (Signed)
Call placed to patient regarding scheduled [facility] appointment. LCSW left message requesting a return call.

## 2020-06-15 ENCOUNTER — Emergency Department (HOSPITAL_COMMUNITY): Payer: No Typology Code available for payment source

## 2020-06-15 ENCOUNTER — Emergency Department (HOSPITAL_COMMUNITY)
Admission: EM | Admit: 2020-06-15 | Discharge: 2020-06-15 | Disposition: A | Discharge status: Home / Self Care | Payer: No Typology Code available for payment source | Attending: Emergency Medicine | Admitting: Emergency Medicine

## 2020-06-15 ENCOUNTER — Other Ambulatory Visit: Payer: Self-pay

## 2020-06-15 ENCOUNTER — Encounter (HOSPITAL_COMMUNITY): Payer: Self-pay | Admitting: Emergency Medicine

## 2020-06-15 DIAGNOSIS — R0781 Pleurodynia: Secondary | ICD-10-CM | POA: Insufficient documentation

## 2020-06-15 DIAGNOSIS — F17210 Nicotine dependence, cigarettes, uncomplicated: Secondary | ICD-10-CM | POA: Insufficient documentation

## 2020-06-15 DIAGNOSIS — F17290 Nicotine dependence, other tobacco product, uncomplicated: Secondary | ICD-10-CM | POA: Diagnosis not present

## 2020-06-15 DIAGNOSIS — J45909 Unspecified asthma, uncomplicated: Secondary | ICD-10-CM | POA: Insufficient documentation

## 2020-06-15 DIAGNOSIS — R0789 Other chest pain: Secondary | ICD-10-CM

## 2020-06-15 MED ORDER — METHOCARBAMOL 500 MG PO TABS: 500.0000 mg | ORAL_TABLET | Freq: Two times a day (BID) | ORAL | 0 refills | Status: DC

## 2020-06-15 NOTE — ED Notes (Signed)
AVS reviewed with patient. Patient ambulatory out of dept.
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
## 2020-06-16 ENCOUNTER — Emergency Department (HOSPITAL_COMMUNITY): Payer: No Typology Code available for payment source

## 2020-06-16 ENCOUNTER — Emergency Department (HOSPITAL_COMMUNITY)
Admission: EM | Admit: 2020-06-16 | Discharge: 2020-06-16 | Disposition: A | Discharge status: Home / Self Care | Payer: No Typology Code available for payment source | Attending: Emergency Medicine | Admitting: Emergency Medicine

## 2020-06-16 ENCOUNTER — Encounter (HOSPITAL_COMMUNITY): Payer: Self-pay | Admitting: Emergency Medicine

## 2020-06-16 ENCOUNTER — Other Ambulatory Visit: Payer: Self-pay

## 2020-06-16 DIAGNOSIS — F17210 Nicotine dependence, cigarettes, uncomplicated: Secondary | ICD-10-CM | POA: Diagnosis not present

## 2020-06-16 DIAGNOSIS — M79641 Pain in right hand: Secondary | ICD-10-CM | POA: Diagnosis present

## 2020-06-16 DIAGNOSIS — J45909 Unspecified asthma, uncomplicated: Secondary | ICD-10-CM | POA: Diagnosis not present

## 2020-06-16 DIAGNOSIS — Y9389 Activity, other specified: Secondary | ICD-10-CM | POA: Diagnosis not present

## 2020-06-16 DIAGNOSIS — Y92410 Unspecified street and highway as the place of occurrence of the external cause: Secondary | ICD-10-CM | POA: Diagnosis not present

## 2020-06-16 DIAGNOSIS — F17290 Nicotine dependence, other tobacco product, uncomplicated: Secondary | ICD-10-CM | POA: Insufficient documentation
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
## 2020-06-16 NOTE — Discharge Instructions (Addendum)
Your xray did not show any signs of fractures however your pain is likely related to a soft tissue injury given your swelling/bruising.   While at home please rest, ice, and elevate your hand to reduce pain/swelling. Compression with the ace wrap will also help.   I would recommend taking 600 mg Ibuprofen (3 OTC tablets) every 8 hours and 1,000 mg Tylenol (2 OTC double strength tablets) every 8 hours as needed for pain. You can alternate the medication every 4 hours (Take Ibuprofen then 4 hours later Tylenol then 4 hours later Ibuprofen again).   Please follow up with your PCP regarding ED visit. If you do not have a PCP you can follow up with [facility] for primary care needs.   Return to the ED for any worsening symptoms
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
## 2020-08-02 ENCOUNTER — Other Ambulatory Visit: Payer: Self-pay

## 2020-08-02 ENCOUNTER — Encounter (HOSPITAL_COMMUNITY): Payer: Self-pay | Admitting: Emergency Medicine

## 2020-08-02 ENCOUNTER — Ambulatory Visit (HOSPITAL_COMMUNITY)
Admission: EM | Admit: 2020-08-02 | Discharge: 2020-08-02 | Disposition: A | Discharge status: Home / Self Care | Payer: Self-pay | Attending: Emergency Medicine | Admitting: Emergency Medicine

## 2020-08-02 DIAGNOSIS — Z202 Contact with and (suspected) exposure to infections with a predominantly sexual mode of transmission: Secondary | ICD-10-CM | POA: Insufficient documentation

## 2020-08-02 MED ORDER — METRONIDAZOLE 500 MG PO TABS
2000.0000 mg | ORAL_TABLET | Freq: Once | ORAL | Status: AC
  Administered 2020-08-02: 2000 mg via ORAL

## 2020-08-02 MED ORDER — METRONIDAZOLE 500 MG PO TABS
ORAL_TABLET | ORAL | Status: AC
  Filled 2020-08-02: qty 4

## 2020-08-02 MED ORDER — CEFTRIAXONE SODIUM 500 MG IJ SOLR
INTRAMUSCULAR | Status: AC
  Filled 2020-08-02: qty 500

## 2020-08-02 MED ORDER — CEFTRIAXONE SODIUM 500 MG IJ SOLR
500.0000 mg | Freq: Once | INTRAMUSCULAR | Status: AC
  Administered 2020-08-02: 500 mg via INTRAMUSCULAR

## 2020-08-02 MED ORDER — LIDOCAINE HCL (PF) 1 % IJ SOLN
INTRAMUSCULAR | Status: AC
  Filled 2020-08-02: qty 2

## 2020-08-02 MED ORDER — DOXYCYCLINE HYCLATE 100 MG PO CAPS: 100.0000 mg | ORAL_CAPSULE | Freq: Two times a day (BID) | ORAL | 0 refills | Status: AC

## 2020-08-02 NOTE — Discharge Instructions (Signed)
Take doxycycline twice daily for the next 7 days.

## 2020-08-02 NOTE — ED Triage Notes (Signed)
Patient presents for STD screening. Patient had STD exposure.   Patient has no complaints today.   Patient denies dysuria or penile discharge.

## 2020-08-02 NOTE — ED Provider Notes (Signed)
____________________________________________  Time seen: Approximately 8:16 PM  I have reviewed the triage vital signs and the nursing notes.   HISTORY  Chief Complaint Exposure to STD   Historian Patient     HPI Eric Castillo. is a 44 y.o. male presents to the urgent care for STD treatment and testing.  Patient recently had unprotected sex with a partner who recently tested positive for gonorrhea.  He states that he is completely asymptomatic at this time.  No penile discharge, dysuria, hematuria, increased urinary frequency, low back pain or fever.   Past Medical History:  Diagnosis Date  . Abscess, intra-abdominal, postoperative   . Asthma   . 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 Iron deficiency anemia due to chronic blood loss 01/17/2020  . SBO (small bowel obstruction) ([place])      Immunizations up to date:  Yes.     Past Medical History:  Diagnosis Date  . Abscess, intra-abdominal, postoperative   . Asthma   . 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 Iron deficiency anemia due to chronic blood loss 01/17/2020  . SBO (small bowel obstruction) [facility])     Patient Active Problem List   Diagnosis Date Noted  . Low normal serum vitamin B12 01/17/2020  . Iron deficiency anemia due to chronic blood loss 01/17/2020  . Ileus following gastrointestinal surgery ([place])   . Family history of colon cancer in father - 41's 12/21/2018  . Tobacco use   . 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 Asthma 05/03/2018    Past Surgical History:  Procedure Laterality Date  . APPENDECTOMY N/A 11/29/2019   Procedure: Appendectomy;  Surgeon: Coralie Keens, MD;  Location: [place];  Service: General;  Laterality: N/A;  . BIOPSY  12/28/2018   Procedure: BIOPSY;  Surgeon: Mauri Pole, MD;  Location: [place];  Service: Endoscopy;;  . COLONOSCOPY WITH PROPOFOL N/A  12/28/2018   Procedure: COLONOSCOPY WITH PROPOFOL;  Surgeon: Mauri Pole, MD;  Location: [place] [facility];  Service: Endoscopy;  Laterality: N/A;  Pennie Rushing N/A 11/29/2019   Procedure: Ileocecetomy;  Surgeon: Coralie Keens, MD;  Location: [place];  Service: General;  Laterality: N/A;  . LAPAROTOMY N/A 11/29/2019   Procedure: EXPLORATORY LAPAROTOMY;  Surgeon: Coralie Keens, MD;  Location: [place];  Service: General;  Laterality: N/A;  . MASS EXCISION     UPPER BACK     Prior to Admission medications   Medication Sig Start Date End Date Taking? Authorizing Provider  acetaminophen (TYLENOL) 500 MG tablet Take 2 tablets (1,000 mg total) by mouth every 6 (six) hours as needed for mild pain. 12/11/19   Saverio Danker, PA-C  albuterol (VENTOLIN HFA) 108 (90 Base) MCG/ACT inhaler Inhale 1-2 puffs into the lungs every 6 (six) hours as needed for wheezing or shortness of breath. 01/16/20   Gatha Mayer, MD  cholestyramine Lucrezia Starch) 4 GM/DOSE powder Take 1 packet (4 g total) by mouth 2 (two) times daily with a meal. 01/16/20   Gatha Mayer, MD  doxycycline (VIBRAMYCIN) 100 MG capsule Take 1 capsule (100 mg total) by mouth 2 (two) times daily for 7 days. 08/02/20 08/09/20  Lannie Fields, PA-C  methocarbamol (ROBAXIN) 500 MG tablet Take 1 tablet (500 mg total) by mouth 2 (two) times daily. 06/15/20   Tedd Sias, PA  oxyCODONE (OXY IR/ROXICODONE) 5 MG immediate release tablet Take 1-2 tablets (  5-10 mg total) by mouth every 4 (four) hours as needed for moderate pain or severe pain. 12/11/19   Saverio Danker, PA-C  potassium chloride SA (KLOR-CON) 20 MEQ tablet Take 1 tablet (20 mEq total) by mouth once for 1 dose. 01/17/20 01/17/20  Gatha Mayer, MD    Allergies Patient has no known allergies.  Family History  Problem Relation Age of Onset  . Crohn's disease Mother   . Heart attack Mother        In her late 33s.  This was the cause of her death.  . Colon cancer Father        In  his mid-to-late 60s.  As of 2020, patient in his early 26s and cancer said to be in remission.  Marland Kitchen Heart disease Father        has pacemaker    Social History Social History   Tobacco Use  . Smoking status: Current Every Day Smoker    Packs/day: 0.50    Years: 21.00    Pack years: 10.50    Types: Cigarettes, Cigars  . Smokeless tobacco: Never Used  Vaping Use  . Vaping Use: Never used  Substance Use Topics  . Alcohol use: Yes  . Drug use: Yes    Types: Marijuana     Review of Systems  Constitutional: No fever/chills Eyes:  No discharge ENT: No upper respiratory complaints. Respiratory: no cough. No SOB/ use of accessory muscles to breath Gastrointestinal:   No nausea, no vomiting.  No diarrhea.  No constipation. Musculoskeletal: Negative for musculoskeletal pain. Skin: Negative for rash, abrasions, lacerations, ecchymosis.    ____________________________________________   PHYSICAL EXAM:  VITAL SIGNS: ED Triage Vitals  Enc Vitals Group     BP 08/02/20 1705 131/81     Pulse Rate 08/02/20 1705 89     Resp 08/02/20 1705 16     Temp 08/02/20 1705 98.5 F (36.9 C)     Temp Source 08/02/20 1705 Oral     SpO2 08/02/20 1705 100 %     Weight --      Height --      Head Circumference --      Peak Flow --      Pain Score 08/02/20 1702 0     Pain Loc --      Pain Edu? --      Excl. in [place]? --      Constitutional: Alert and oriented. Well appearing and in no acute distress. Eyes: Conjunctivae are normal. PERRL. EOMI. Head: Atraumatic. Cardiovascular: Normal rate, regular rhythm. Normal S1 and S2.  Good peripheral circulation. Respiratory: Normal respiratory effort without tachypnea or retractions. Lungs CTAB. Good air entry to the bases with no decreased or absent breath sounds Gastrointestinal: Bowel sounds x 4 quadrants. Soft and nontender to palpation. No guarding or rigidity. No distention. Musculoskeletal: Full range of motion to all extremities. No obvious  deformities noted Neurologic:  Normal for age. No gross focal neurologic deficits are appreciated.  Skin:  Skin is warm, dry and intact. No rash noted. Psychiatric: Mood and affect are normal for age. Speech and behavior are normal.   ____________________________________________   LABS (all labs ordered are listed, but only abnormal results are displayed)  Labs Reviewed  CYTOLOGY, (ORAL, ANAL, URETHRAL) ANCILLARY ONLY   ____________________________________________  EKG   ____________________________________________  RADIOLOGY   No results found.  ____________________________________________    PROCEDURES  Procedure(s) performed:     Procedures     Medications  cefTRIAXone (ROCEPHIN) injection 500 mg (500 mg Intramuscular Given 08/02/20 1743)  metroNIDAZOLE (FLAGYL) tablet 2,000 mg (2,000 mg Oral Given 08/02/20 1741)     ____________________________________________   INITIAL IMPRESSION / ASSESSMENT AND PLAN / ED COURSE  Pertinent labs & imaging results that were available during my care of the patient were reviewed by me and considered in my medical decision making (see chart for details).      Assessment and plan STD exposure 44 year old male presents to the urgent care after being exposed to an STD.  Testing for gonorrhea, chlamydia and trichomoniasis are in process at this time.  Patient received Rocephin and Flagyl in the urgent care.  He was discharged with doxycycline twice daily for the next 7 days.  Return precautions were given to return with new or worsening symptoms.  Patient was advised to abstain from unprotected sex for the next 7 days.    ____________________________________________  FINAL CLINICAL IMPRESSION(S) / ED DIAGNOSES  Final diagnoses:  STD exposure      NEW MEDICATIONS STARTED DURING THIS VISIT:  ED Discharge Orders         Ordered    doxycycline (VIBRAMYCIN) 100 MG capsule  2 times daily        08/02/20 1746               This chart was dictated using voice recognition software/Dragon. Despite best efforts to proofread, errors can occur which can change the meaning. Any change was purely unintentional.     Lannie Fields, PA-C 08/02/20 2017

## 2020-08-05 LAB — CYTOLOGY, (ORAL, ANAL, URETHRAL) ANCILLARY ONLY
Chlamydia: NEGATIVE
Comment: NEGATIVE
Comment: NORMAL
Neisseria Gonorrhea: POSITIVE — AB

## 2020-12-03 IMAGING — CT CT ABD-PELV W/ CM
1 series · 8 of 34 positions shown, 10 images · IV contrast (APPLIED)
Comparison: 11/27/2019.

CLINICAL DATA: History of Crohn's disease. Status post recent ex
lap with ileo seek ectomy and appendectomy for small bowel
strictures and fistula. Evaluate for abscess.

EXAM:
CT ABDOMEN AND PELVIS WITH CONTRAST
TECHNIQUE: Multidetector CT imaging of the abdomen and pelvis was performed
using the standard protocol following bolus administration of
intravenous contrast.
CONTRAST:  100mL OMNIPAQUE IOHEXOL 300 MG/ML  SOLN

[Series 7: abdomen 3.0 mpr sag · sagittal · 0.45mm/px · 8 of 102 slices shown, 10 images]
[im 14/102  soft-tissue]
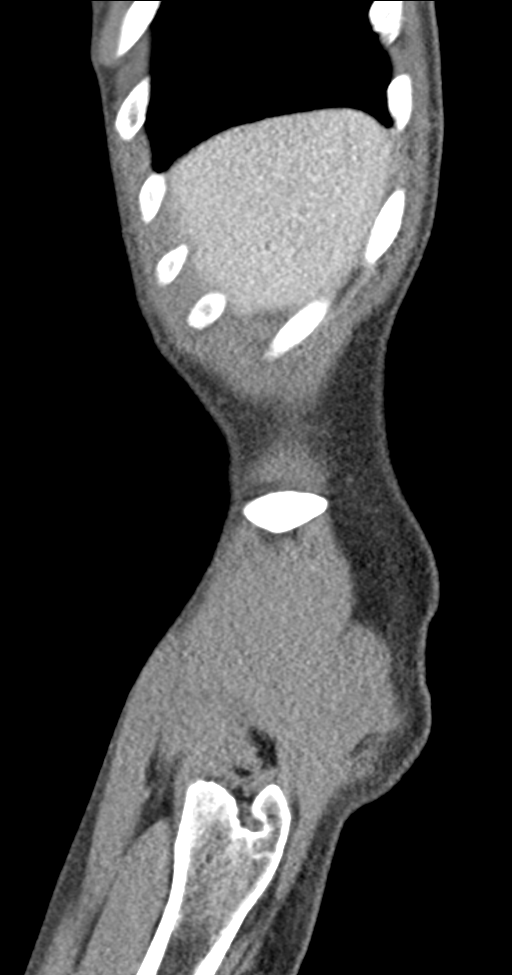
[im 14/102  bone]
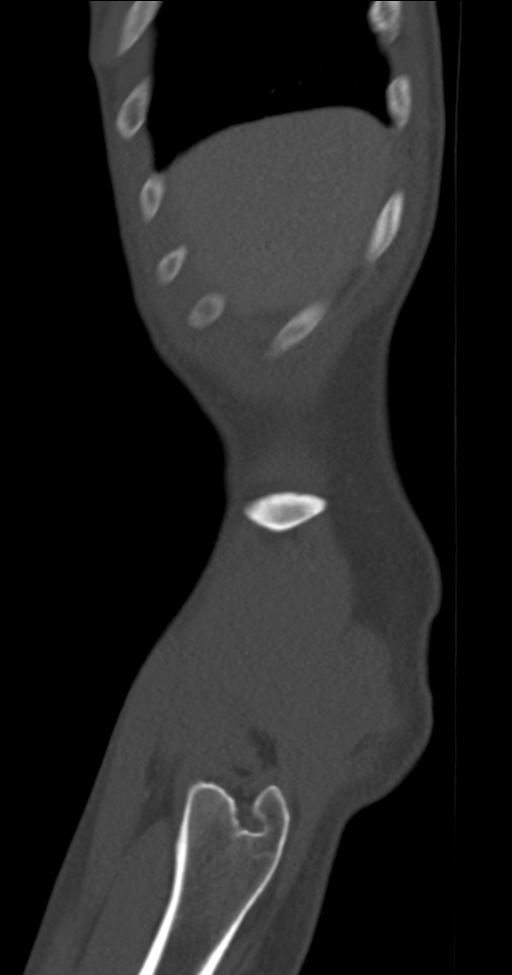
[im 33/102  soft-tissue]
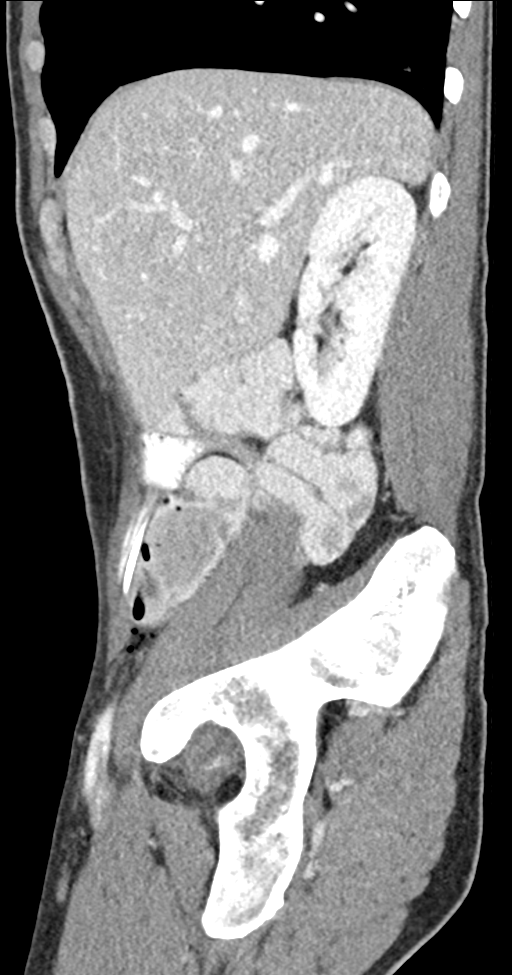
[im 53/102  soft-tissue]
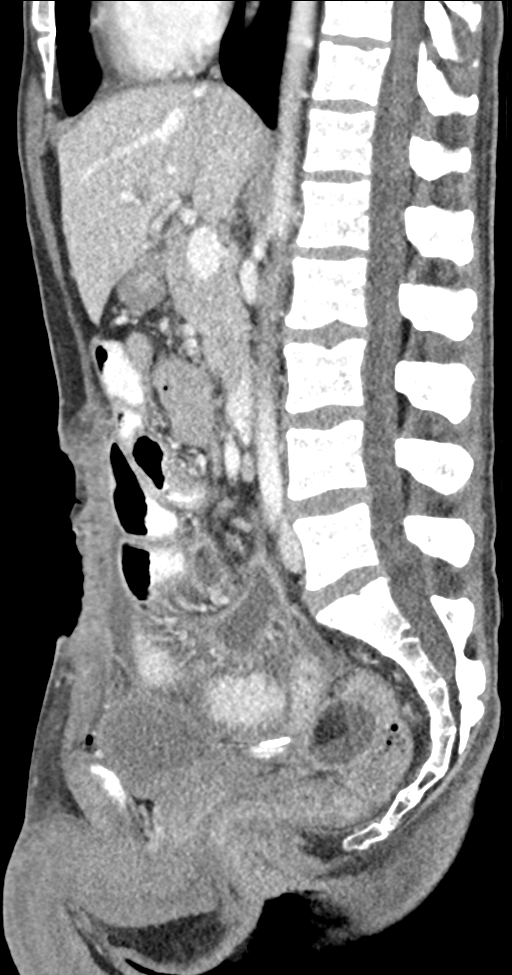
[im 69/102  soft-tissue]
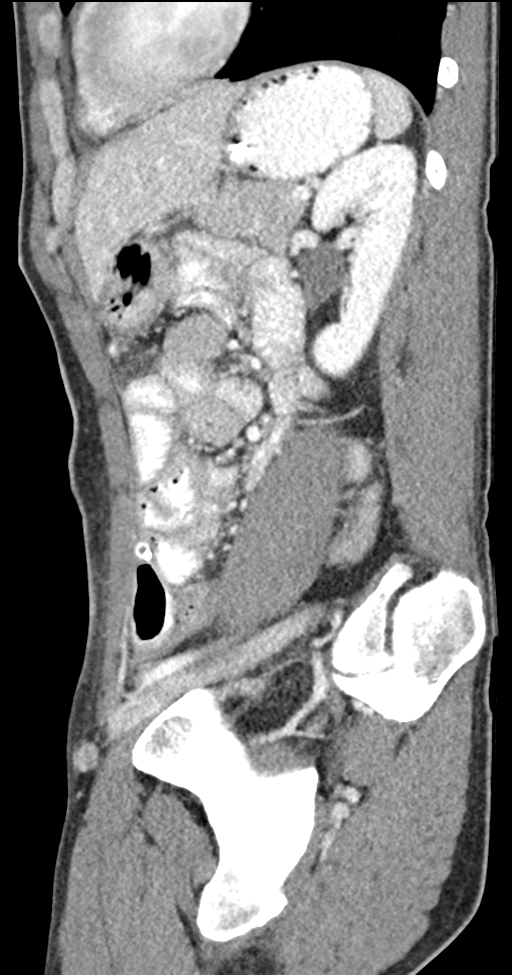
[im 88/102  soft-tissue]
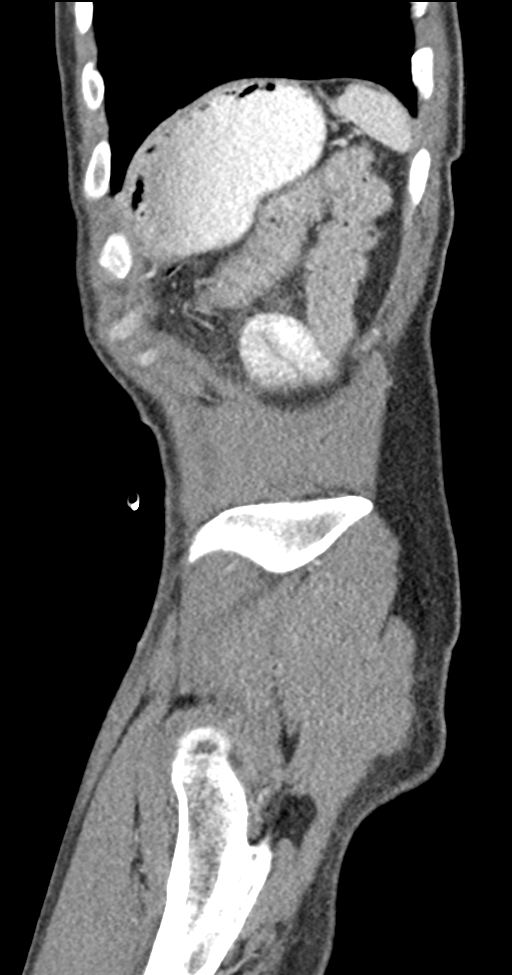
[im 88/102  lung]
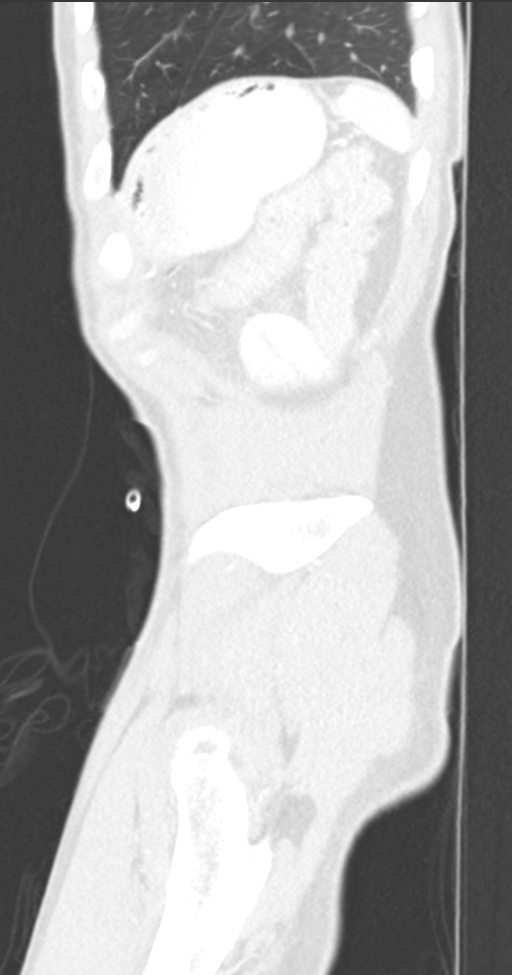
[im 92/102  lung]
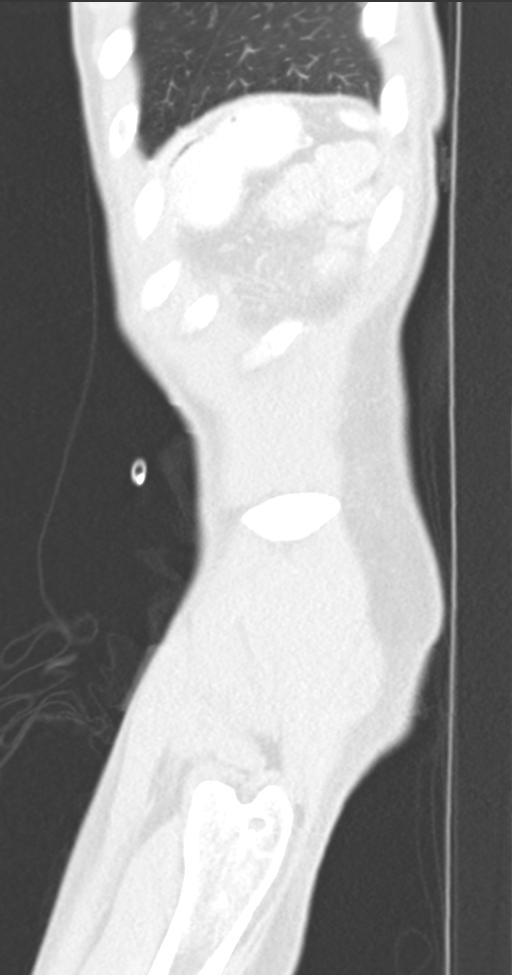
[im 95/102  lung]
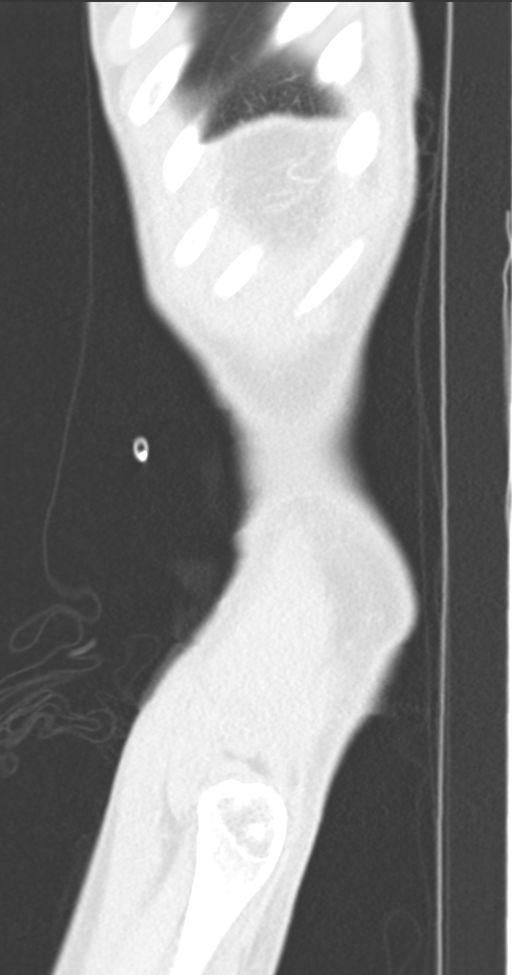
[im 98/102  lung]
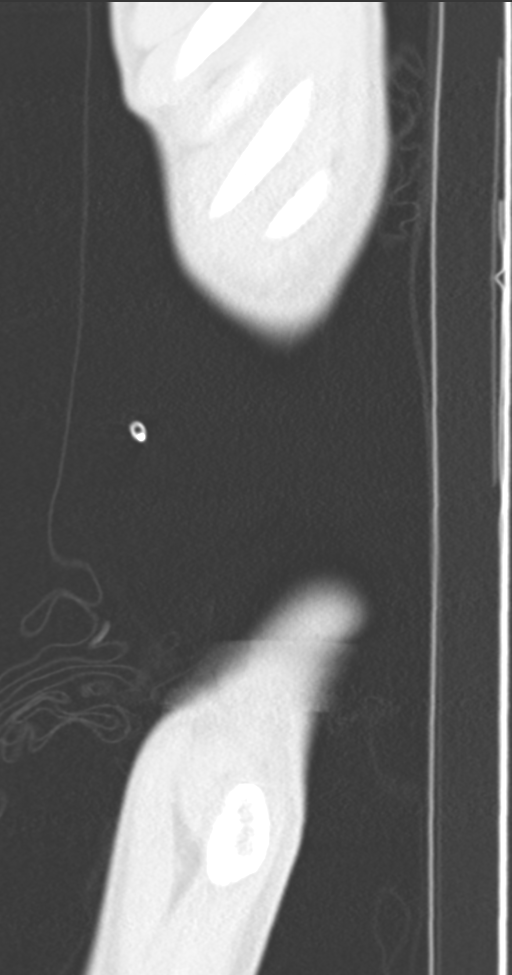

[8 of 34 positions shown; findings below may reference images not displayed]

FINDINGS: Lower chest: No acute abnormality.

Hepatobiliary: No focal liver abnormality is seen. No gallstones,
gallbladder wall thickening, or biliary dilatation.

Pancreas: Right lobe of liver cyst measures 2.1 cm. No suspicious
liver abnormality. Gallbladder negative. No biliary ductal
dilatation.

Spleen: Normal in size without focal abnormality.

Adrenals/Urinary Tract: Adrenal glands are unremarkable. Kidneys are
normal, without renal calculi, focal lesion, or hydronephrosis.
Bladder is unremarkable.

Stomach/Bowel: NG tube is in the body of the stomach. Mild
distension of the gastric lumen. Proximal small bowel loops have a
normal caliber. Increase caliber of the mid and distal small bowel
loops. Small bowel loops just proximal to the anastomosis measure up
to 3.4 cm, image 63/3. No dilated loops of large bowel identified.

Vascular/Lymphatic: Mild aortic atherosclerosis. No aneurysm. No
adenopathy.

Reproductive: Prostate is unremarkable.

Other: There is a surgical drain which enters from the left lower
quadrant approach, dives deep into the posterior pelvis and then
terminates in the right lower quadrant of the abdomen. Debris and
fluid collection within the posterior pelvis just ventral to the
rectum measures 6.6 x 2.9 by 2.6 cm, image 67/3 and image 55/7.
Within the right hemipelvis there is a fluid collection adjacent to
the enterocolonic anastomosis with measures 3.4 x 3.0 by 4.9 cm,
image 39/6. No additional fluid collections identified.

Ventral midline open wound is identified.

Musculoskeletal: No acute or significant osseous findings.
IMPRESSION: 1. There are 2 fluid collections identified within the pelvis. The
largest is located within the posterior pelvis just ventral to the
rectum measuring up to 6.6 cm. The second fluid collection is within
the right hemipelvis adjacent to the anastomosis measuring up to
cm
2. Postoperative changes compatible with ileocolonic anastomosis.
3. Increase caliber of the mid and distal small bowel loops
compatible with postoperative ileus.

Aortic Atherosclerosis ([id number].F).

## 2020-12-04 IMAGING — CT CT ABDOMEN W/O CM
1 series · 15 of 32 positions shown, 19 images · non-contrast
Comparison: 12/05/2019

CLINICAL DATA: 43-year-old male with a history of Crohn's and
pelvic abscess on recent CT 12/05/2019. He presents today for
attempt at possible drainage rectovesical fluid collection.

EXAM:
CT ABDOMEN WITHOUT CONTRAST
TECHNIQUE: Multidetector CT imaging of the abdomen was performed following the
standard protocol without IV contrast.

[Series 2: i-spiral 5.0 b40f · axial · 0.82mm/px · z∈[+887,+1073]mm · 15 of 59 slices shown, 19 images]
[im 4/59  soft-tissue]
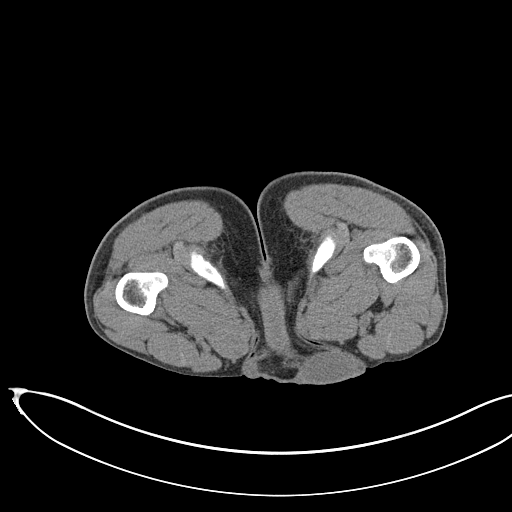
[im 4/59  bone]
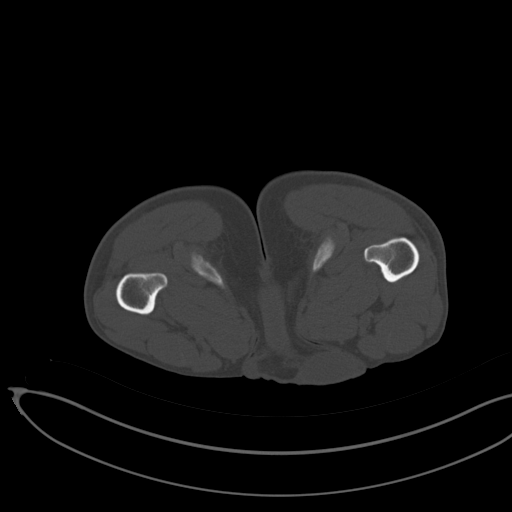
[im 8/59  soft-tissue]
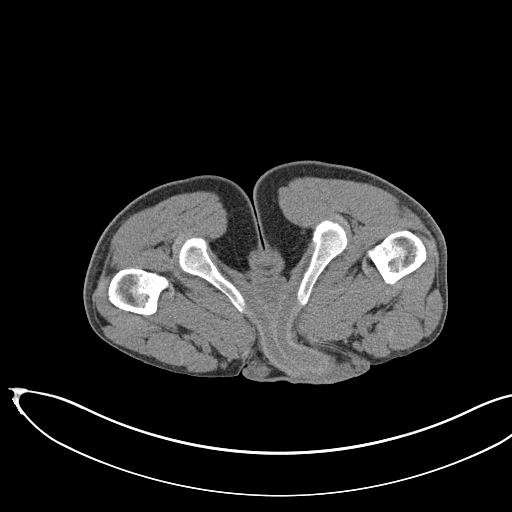
[im 12/59  soft-tissue]
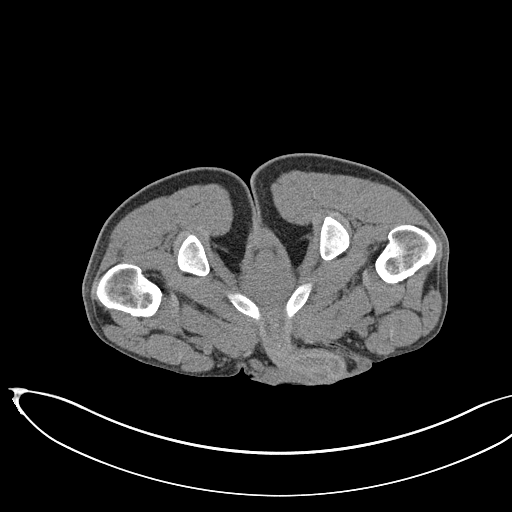
[im 17/59  soft-tissue]
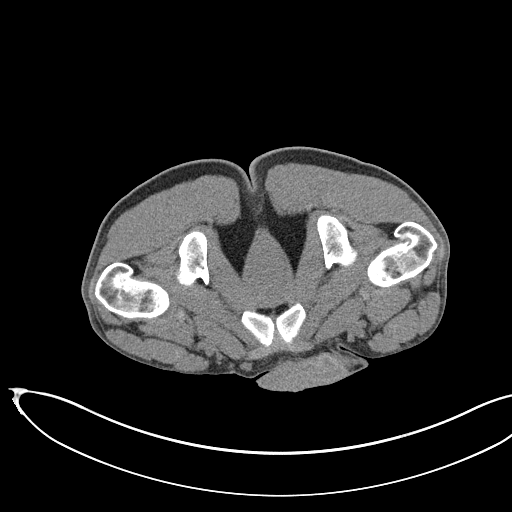
[im 21/59  soft-tissue]
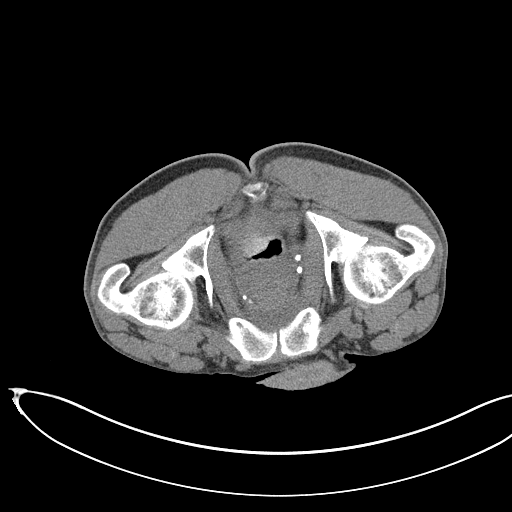
[im 25/59  soft-tissue]
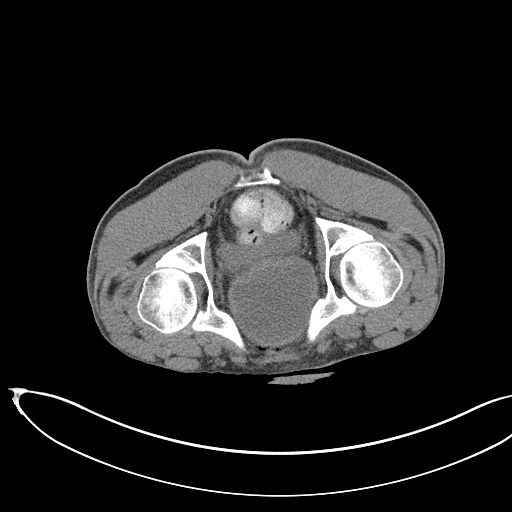
[im 30/59  soft-tissue]
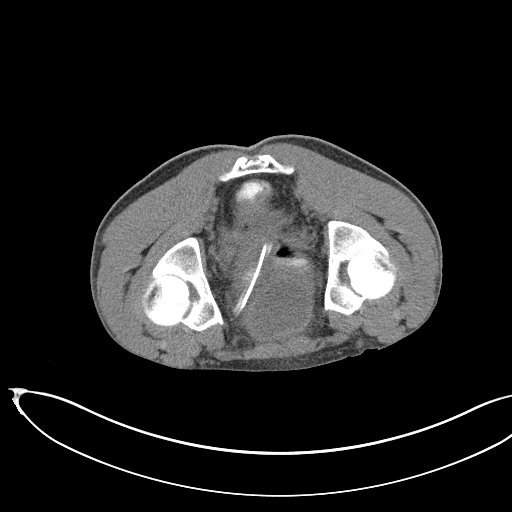
[im 34/59  soft-tissue]
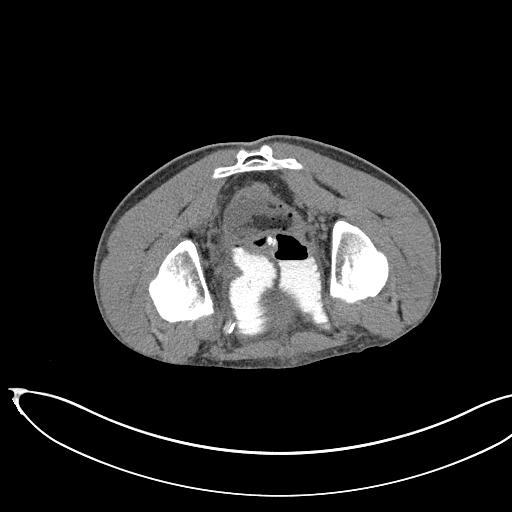
[im 38/59  soft-tissue]
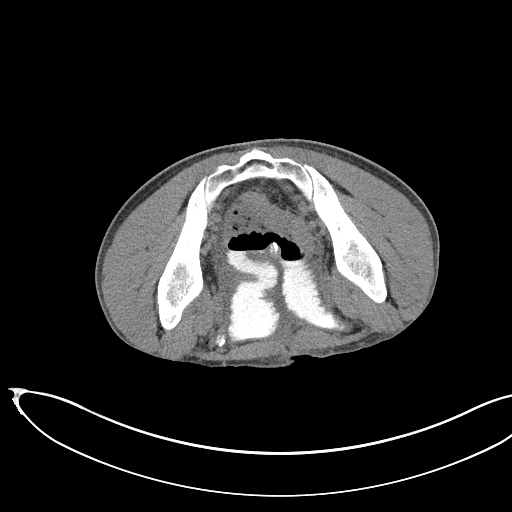
[im 38/59  bone]
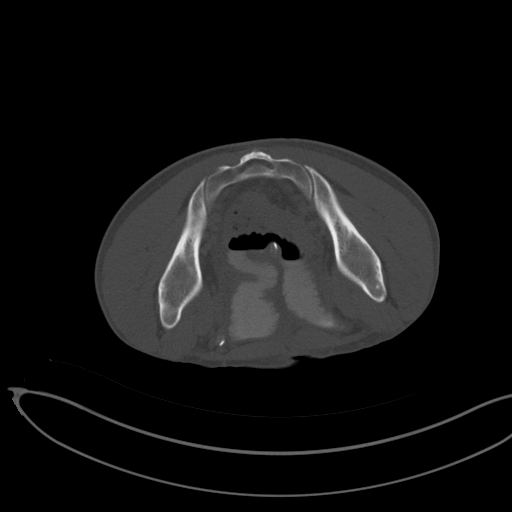
[im 42/59  soft-tissue]
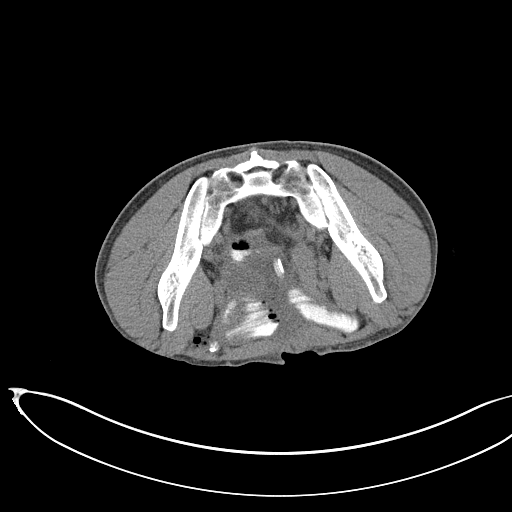
[im 47/59  soft-tissue]
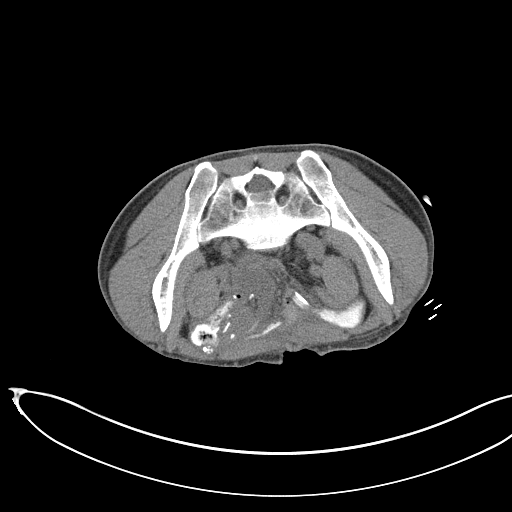
[im 51/59  soft-tissue]
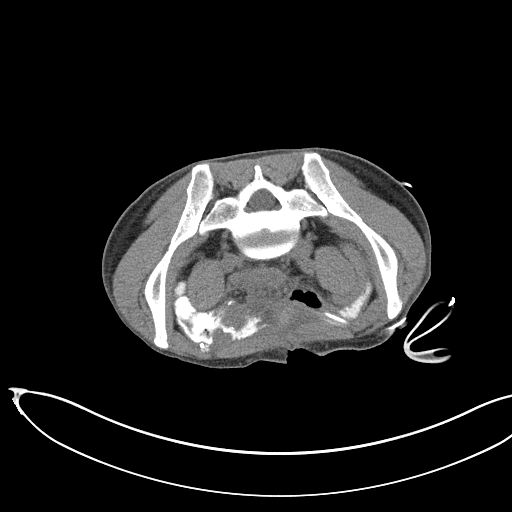
[im 51/59  lung]
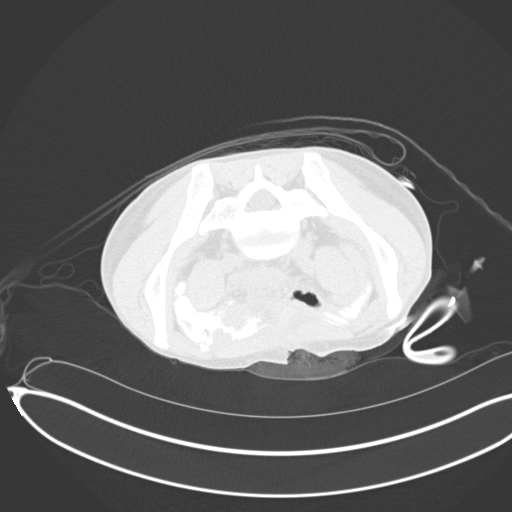
[im 53/59  lung]
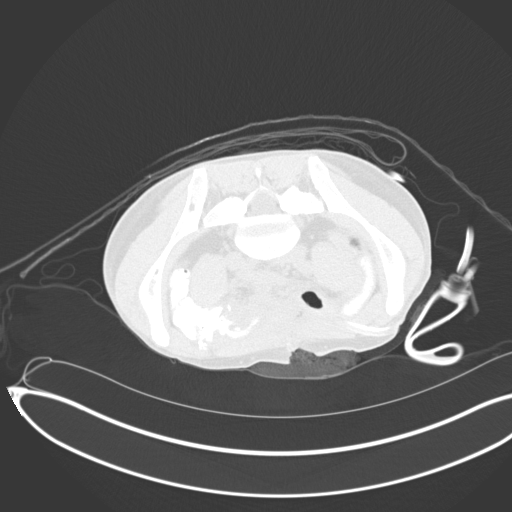
[im 55/59  soft-tissue]
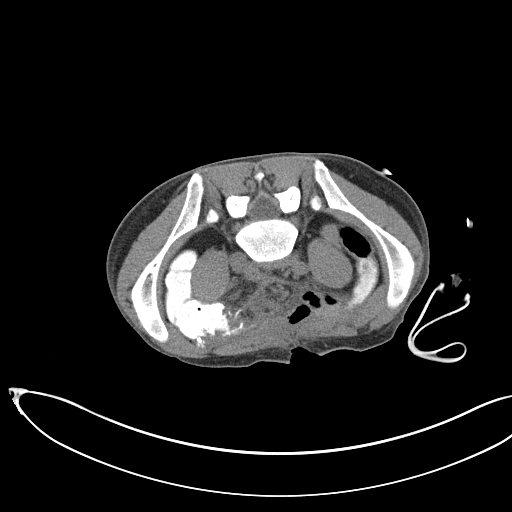
[im 55/59  lung]
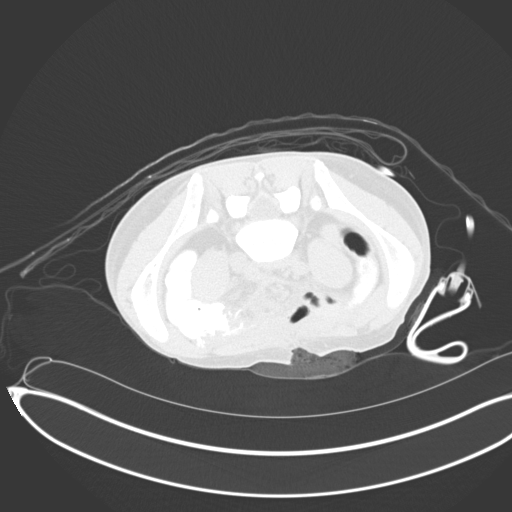
[im 57/59  lung]
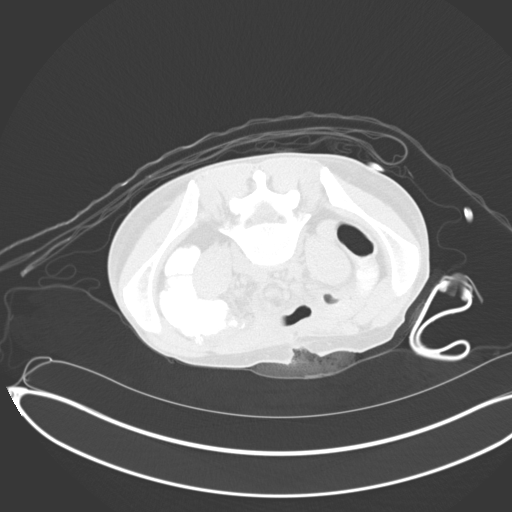

[15 of 32 positions shown; findings below may reference images not displayed]

FINDINGS: Stomach/Bowel:

Limited visualization of small bowel and colon, with enteric
contrast present. No significant distention. Surgical changes again
evident in the right lower quadrant.

Vascular/Lymphatic: Unremarkable visualized vasculature, with
minimal atherosclerosis.

Surgical drain from anterior approach in place with evidence of
prior midline laparotomy/surgery.

Other: The initial targeted fluid collection in the rectovesical
space is significantly decreased in size, with the greatest diameter
now only 16 mm as to a prior measurement of 30 mm. Transverse
dimension 5.4 cm versus previous of 6.6 cm.

In addition, the more superior and right fluid collection appears
read distributed/decreasing, though is poorly defined with the
absence of IV contrast.

Musculoskeletal: Unremarkable
IMPRESSION: Fluid collection in the rectovesical space is decreased in size with
no safe target for attempted drainage on the current CT. We did not
attempt a transgluteal drain at this time.

## 2022-12-08 ENCOUNTER — Ambulatory Visit (HOSPITAL_COMMUNITY)
Admission: EM | Admit: 2022-12-08 | Discharge: 2022-12-08 | Disposition: A | Discharge status: Home / Self Care | Payer: 59 | Attending: Family Medicine | Admitting: Family Medicine

## 2022-12-08 ENCOUNTER — Encounter (HOSPITAL_COMMUNITY): Payer: Self-pay

## 2022-12-08 ENCOUNTER — Ambulatory Visit (INDEPENDENT_AMBULATORY_CARE_PROVIDER_SITE_OTHER): Payer: 59

## 2022-12-08 DIAGNOSIS — M25561 Pain in right knee: Secondary | ICD-10-CM | POA: Diagnosis present

## 2022-12-08 DIAGNOSIS — M25461 Effusion, right knee: Secondary | ICD-10-CM | POA: Diagnosis not present

## 2022-12-08 LAB — URIC ACID: Uric Acid, Serum: 3.8 mg/dL (ref 3.7–8.6)

## 2022-12-08 MED ORDER — PREDNISONE 50 MG PO TABS: 50.0000 mg | ORAL_TABLET | Freq: Every day | ORAL | 0 refills | Status: AC

## 2022-12-08 NOTE — Discharge Instructions (Addendum)
You were seen today for swelling of the knee.  The xray shows a moderate amount of fluid, but no obvious arthritis.  The blood work will be resulted tomorrow and you will be called with results if abnormal, and you can also see this on my chart.  I have sent out a course of prednisone to take daily x 5 days.  Please take with food to avoid an upset stomach.  I recommend you follow up with your primary care provider if this does not improve.

## 2022-12-08 NOTE — ED Provider Notes (Signed)
[facility]    CSN: [id number] Arrival date & time: 12/08/22  1321      History   Chief Complaint Chief Complaint  Patient presents with   Joint Swelling    HPI Eric Simmondshomas Spampinato Jr. is a 47 y.o. male.   He is here for right knee swelling.  Started 4 days ago.  He woke and the knee was swollen.  Unable to walk on it, was painful. No known injury.  Very painful to light touch.  Feels warm to the touch.  No h/o gout that he is aware of.   He does a FH of gout. Today is actually better than what it was initially.   This has happened in the past as well.  It was last year.  He did see his pcp for this.  Given nsaids? PCP did question gout at that time.        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    Asthma 05/03/2018    Past Surgical History:  Procedure Laterality Date   APPENDECTOMY N/A 11/29/2019   Procedure: Appendectomy;  Surgeon: Abigail MiyamotoBlackman, Douglas, MD;  [facility];  Service: General;  Laterality: N/A;   BIOPSY  12/28/2018   Procedure: BIOPSY;  Surgeon: Napoleon FormNandigam, Kavitha V, MD;  Location: [facility];  Service: Endoscopy;;   COLONOSCOPY WITH PROPOFOL N/A 12/28/2018   Procedure: COLONOSCOPY WITH PROPOFOL;  Surgeon: Napoleon FormNandigam, Kavitha V, MD;  Location: [facility];  Service: Endoscopy;  Laterality: N/A;   ILEOCECETOMY N/A 11/29/2019   Procedure: Ileocecetomy;  Surgeon: Abigail MiyamotoBlackman, Douglas,  MD;  [facility];  Service: General;  Laterality: N/A;   LAPAROTOMY N/A 11/29/2019   Procedure: EXPLORATORY LAPAROTOMY;  Surgeon: Abigail MiyamotoBlackman, Douglas, MD;  [facility];  Service: General;  Laterality: N/A;   MASS EXCISION     UPPER BACK        Home Medications    Prior to Admission medications   Medication Sig Start Date End Date Taking? Authorizing Provider  acetaminophen (TYLENOL) 500 MG tablet Take 2 tablets (1,000 mg total) by mouth every 6 (six) hours as needed for mild pain. Patient not taking: Reported on 12/08/2022 12/11/19   Barnetta Chapelsborne, Kelly, PA-C  albuterol (VENTOLIN HFA) 108 (90 Base) MCG/ACT inhaler Inhale 1-2 puffs into the lungs every 6 (six) hours as needed for wheezing or shortness of breath. Patient not taking: Reported on 12/08/2022 01/16/20   Iva BoopGessner, Carl E, MD  cholestyramine Lanetta Inch(QUESTRAN) 4 GM/DOSE powder Take 1 packet (4 g total) by mouth 2 (two) times daily with a meal. Patient not taking: Reported on 12/08/2022 01/16/20   Iva BoopGessner, Carl E, MD  methocarbamol (ROBAXIN) 500 MG tablet Take 1 tablet (500 mg total) by mouth 2 (two) times daily. Patient not taking: Reported on 12/08/2022 06/15/20   Gailen ShelterFondaw, Wylder S, PA  oxyCODONE (OXY IR/ROXICODONE) 5 MG immediate release tablet Take 1-2  tablets (5-10 mg total) by mouth every 4 (four) hours as needed for moderate pain or severe pain. Patient not taking: Reported on 12/08/2022 12/11/19   Barnetta Chapel, PA-C  potassium chloride SA (KLOR-CON) 20 MEQ tablet Take 1 tablet (20 mEq total) by mouth once for 1 dose. Patient not taking: Reported on 12/08/2022 01/17/20 01/17/20  Iva Boop, MD    Family History Family History  Problem Relation Age of Onset   Crohn's disease Mother    Heart attack Mother        In her late 23s.  This was the cause of her death.   Colon cancer Father        In his mid-to-late 68s.  As of 2020, patient in his early 83s and cancer said to be in remission.   Heart disease Father        has pacemaker     Social History Social History   Tobacco Use   Smoking status: Every Day    Packs/day: 0.50    Years: 21.00    Additional pack years: 0.00    Total pack years: 10.50    Types: Cigarettes, Cigars   Smokeless tobacco: Never  Vaping Use   Vaping Use: Never used  Substance Use Topics   Alcohol use: Yes   Drug use: Yes    Types: Marijuana     Allergies   Patient has no known allergies.   Review of Systems Review of Systems  Constitutional: Negative.   HENT: Negative.    Respiratory: Negative.    Cardiovascular: Negative.   Gastrointestinal: Negative.   Musculoskeletal:  Positive for joint swelling.     Physical Exam Triage Vital Signs ED Triage Vitals  Enc Vitals Group     BP 12/08/22 1333 137/83     Pulse Rate 12/08/22 1333 89     Resp 12/08/22 1333 14     Temp 12/08/22 1333 98.1 F (36.7 C)     Temp Source 12/08/22 1333 Oral     SpO2 12/08/22 1333 98 %     Weight --      Height --      Head Circumference --      Peak Flow --      Pain Score 12/08/22 1330 8     Pain Loc --      Pain Edu? --      Excl. in GC? --    No data found.  Updated Vital Signs BP 137/83 (BP Location: Left Arm)   Pulse 89   Temp 98.1 F (36.7 C) (Oral)   Resp 14   SpO2 98%   Visual Acuity Right Eye Distance:   Left Eye Distance:   Bilateral Distance:    Right Eye Near:   Left Eye Near:    Bilateral Near:     Physical Exam Constitutional:      Appearance: Normal appearance.  Musculoskeletal:     Comments: The right knee is swollen anteriorly;  there is slight warmth;  TTP to the anterior knee;    Neurological:     General: No focal deficit present.     Mental Status: He is alert.  Psychiatric:        Mood and Affect: Mood normal.      UC Treatments / Results  Labs (all labs ordered are listed, but only abnormal results are displayed) Labs Reviewed - No data to display  EKG   Radiology DG Knee Complete 4 Views Right  Result  Date: 12/08/2022 CLINICAL  DATA:  Right knee pain and swelling that began Saturday. History of gout. EXAM: RIGHT KNEE - COMPLETE 4+ VIEW COMPARISON:  None Available. FINDINGS: Moderate/large joint effusion. No acute fracture or dislocation. No evidence of arthropathy or other focal bone abnormality. Soft tissues are unremarkable. IMPRESSION: Moderate/large joint effusion. No acute fracture or dislocation. Electronically Signed   By: Emmaline Kluver M.D.   On: 12/08/2022 13:59    Procedures Procedures (including critical care time)  Medications Ordered in UC Medications - No data to display  Initial Impression / Assessment and Plan / UC Course  I have reviewed the triage vital signs and the nursing notes.  Pertinent labs & imaging results that were available during my care of the patient were reviewed by me and considered in my medical decision making (see chart for details).   Final Clinical Impressions(s) / UC Diagnoses   Final diagnoses:  Acute pain of right knee  Effusion of right knee     Discharge Instructions      You were seen today for swelling of the knee.  The xray shows a moderate amount of fluid, but no obvious arthritis.  The blood work will be resulted tomorrow and you will be called with results if abnormal, and you can also see this on my chart.  I have sent out a course of prednisone to take daily x 5 days.  Please take with food to avoid an upset stomach.  I recommend you follow up with your primary care provider if this does not improve.     ED Prescriptions     Medication Sig Dispense Auth. Provider   predniSONE (DELTASONE) 50 MG tablet Take 1 tablet (50 mg total) by mouth daily for 5 days. 5 tablet Jannifer Franklin, MD      PDMP not reviewed this encounter.   Jannifer Franklin, MD 12/08/22 1410

## 2022-12-08 NOTE — ED Triage Notes (Signed)
Pt presents with c/o rt knee swelling that began Saturday. NKI
